# Patient Record
Sex: Male | Born: 1950 | Race: White | Hispanic: No | Smoking: Former smoker
Health system: Southern US, Community
[De-identification: ages and names within clinical notes are randomized; demographics above are authoritative.]

## PROBLEM LIST (undated history)

## (undated) DIAGNOSIS — E7800 Pure hypercholesterolemia, unspecified: Secondary | ICD-10-CM

## (undated) DIAGNOSIS — R569 Unspecified convulsions: Secondary | ICD-10-CM

## (undated) DIAGNOSIS — E785 Hyperlipidemia, unspecified: Secondary | ICD-10-CM

## (undated) DIAGNOSIS — Z9289 Personal history of other medical treatment: Secondary | ICD-10-CM

## (undated) DIAGNOSIS — G4752 REM sleep behavior disorder: Secondary | ICD-10-CM

## (undated) DIAGNOSIS — I2510 Atherosclerotic heart disease of native coronary artery without angina pectoris: Secondary | ICD-10-CM

## (undated) DIAGNOSIS — J302 Other seasonal allergic rhinitis: Secondary | ICD-10-CM

## (undated) DIAGNOSIS — I714 Abdominal aortic aneurysm, without rupture, unspecified: Secondary | ICD-10-CM

## (undated) DIAGNOSIS — K219 Gastro-esophageal reflux disease without esophagitis: Secondary | ICD-10-CM

## (undated) DIAGNOSIS — J449 Chronic obstructive pulmonary disease, unspecified: Secondary | ICD-10-CM

## (undated) DIAGNOSIS — B20 Human immunodeficiency virus [HIV] disease: Secondary | ICD-10-CM

## (undated) DIAGNOSIS — N189 Chronic kidney disease, unspecified: Secondary | ICD-10-CM

## (undated) DIAGNOSIS — D649 Anemia, unspecified: Secondary | ICD-10-CM

## (undated) DIAGNOSIS — Z21 Asymptomatic human immunodeficiency virus [HIV] infection status: Secondary | ICD-10-CM

## (undated) HISTORY — DX: Hyperlipidemia, unspecified: E78.5

## (undated) HISTORY — DX: Abdominal aortic aneurysm, without rupture: I71.4

## (undated) HISTORY — DX: Abdominal aortic aneurysm, without rupture, unspecified: I71.40

## (undated) HISTORY — DX: Atherosclerotic heart disease of native coronary artery without angina pectoris: I25.10

## (undated) HISTORY — PX: CATARACT EXTRACTION: SUR2

## (undated) HISTORY — DX: Unspecified convulsions: R56.9

## (undated) HISTORY — DX: Chronic kidney disease, unspecified: N18.9

## (undated) HISTORY — DX: Human immunodeficiency virus (HIV) disease: B20

## (undated) HISTORY — DX: Asymptomatic human immunodeficiency virus (hiv) infection status: Z21

## (undated) HISTORY — PX: CHOLECYSTECTOMY: SHX55

## (undated) HISTORY — PX: COLONOSCOPY: SHX174

## (undated) HISTORY — DX: REM sleep behavior disorder: G47.52

## (undated) HISTORY — PX: TONSILLECTOMY: SUR1361

---
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
## 2005-04-24 ENCOUNTER — Emergency Department: Payer: Self-pay | Admitting: Emergency Medicine

## 2005-04-24 ENCOUNTER — Other Ambulatory Visit: Payer: Self-pay

## 2005-07-26 ENCOUNTER — Ambulatory Visit (HOSPITAL_COMMUNITY): Admission: RE | Admit: 2005-07-26 | Discharge: 2005-07-26 | Payer: Self-pay | Admitting: *Deleted

## 2005-08-09 ENCOUNTER — Inpatient Hospital Stay (HOSPITAL_COMMUNITY): Admission: RE | Admit: 2005-08-09 | Discharge: 2005-08-13 | Payer: Self-pay | Admitting: Cardiothoracic Surgery

## 2005-08-09 HISTORY — PX: CORONARY ARTERY BYPASS GRAFT: SHX141

## 2006-02-09 ENCOUNTER — Ambulatory Visit: Payer: Self-pay

## 2006-07-27 ENCOUNTER — Ambulatory Visit: Payer: Self-pay | Admitting: Family Medicine

## 2006-08-28 ENCOUNTER — Ambulatory Visit: Payer: Self-pay | Admitting: Gastroenterology

## 2007-05-18 IMAGING — CR DG CHEST 2V
2 series · 2 of 2 positions shown · non-contrast
Comparison: None.

CLINICAL DATA: Abnormal stress test.  Preop evaluation for cardiac catheterization. 
 CHEST - 2 VIEW:

[view not recorded (1 of 2)]
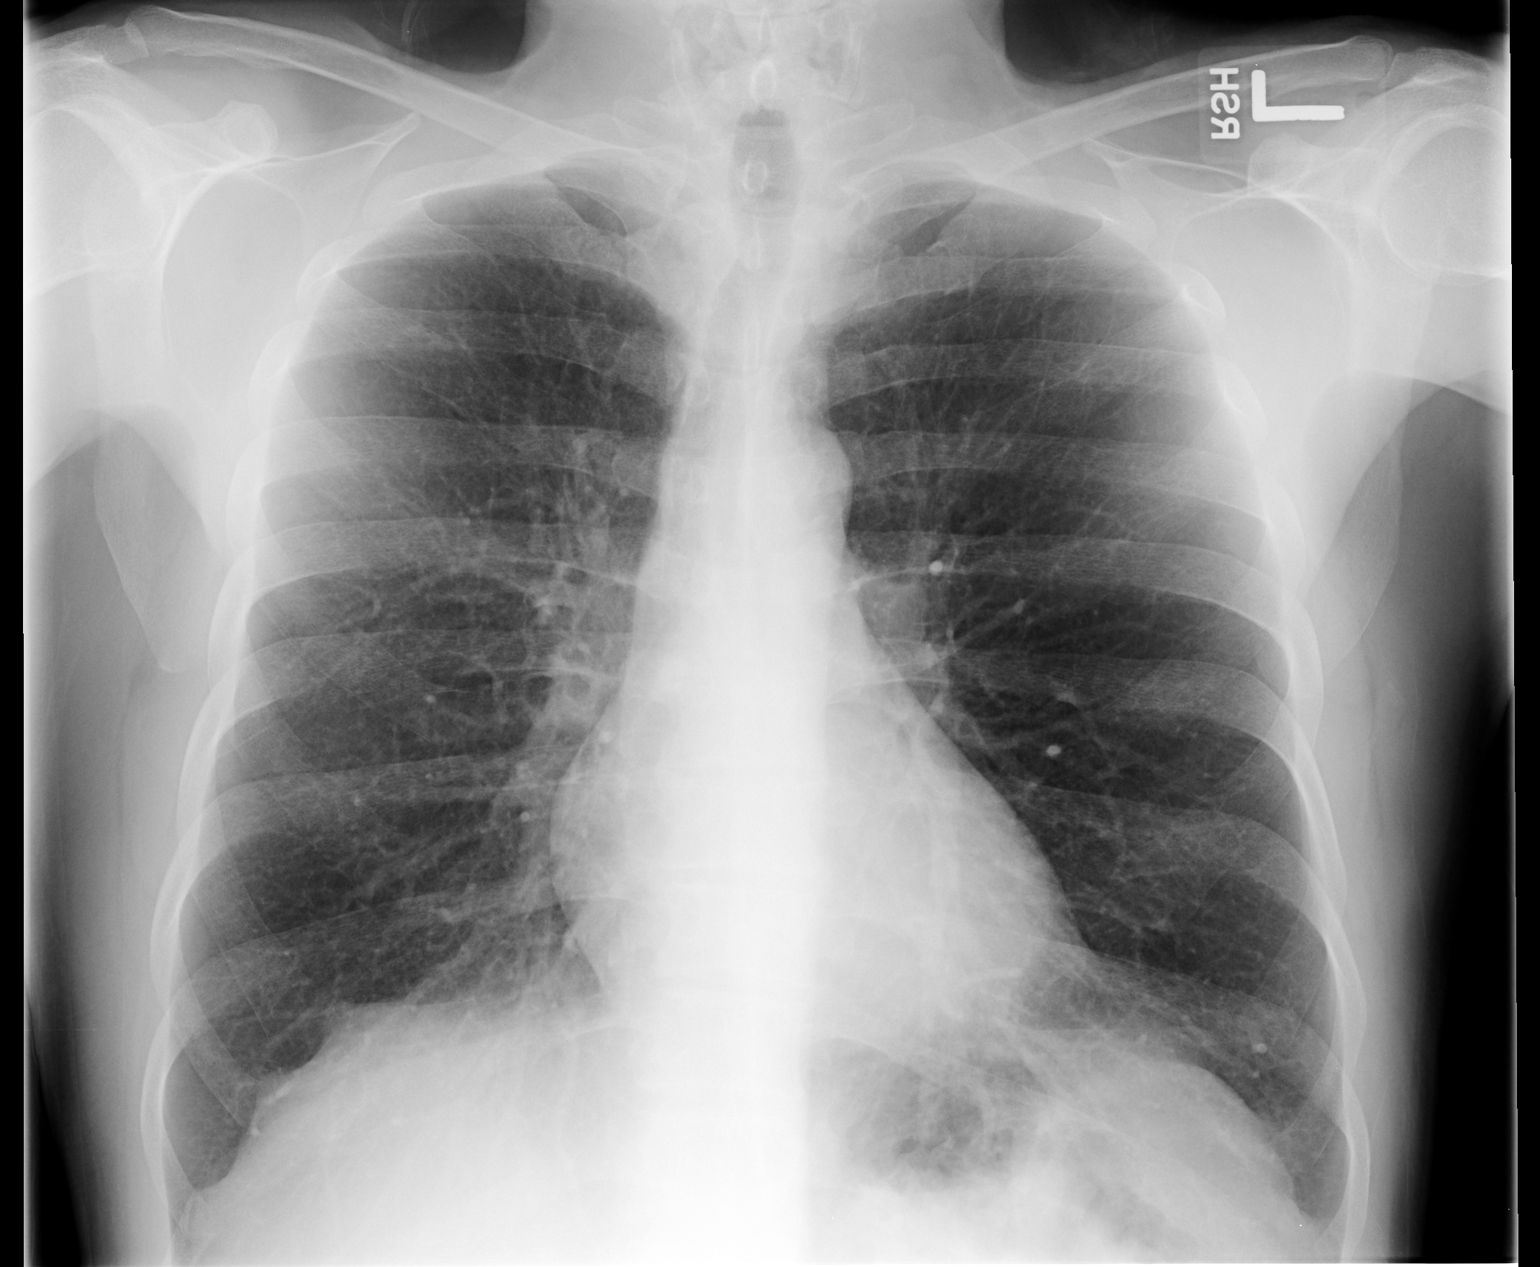

[view not recorded (2 of 2)]
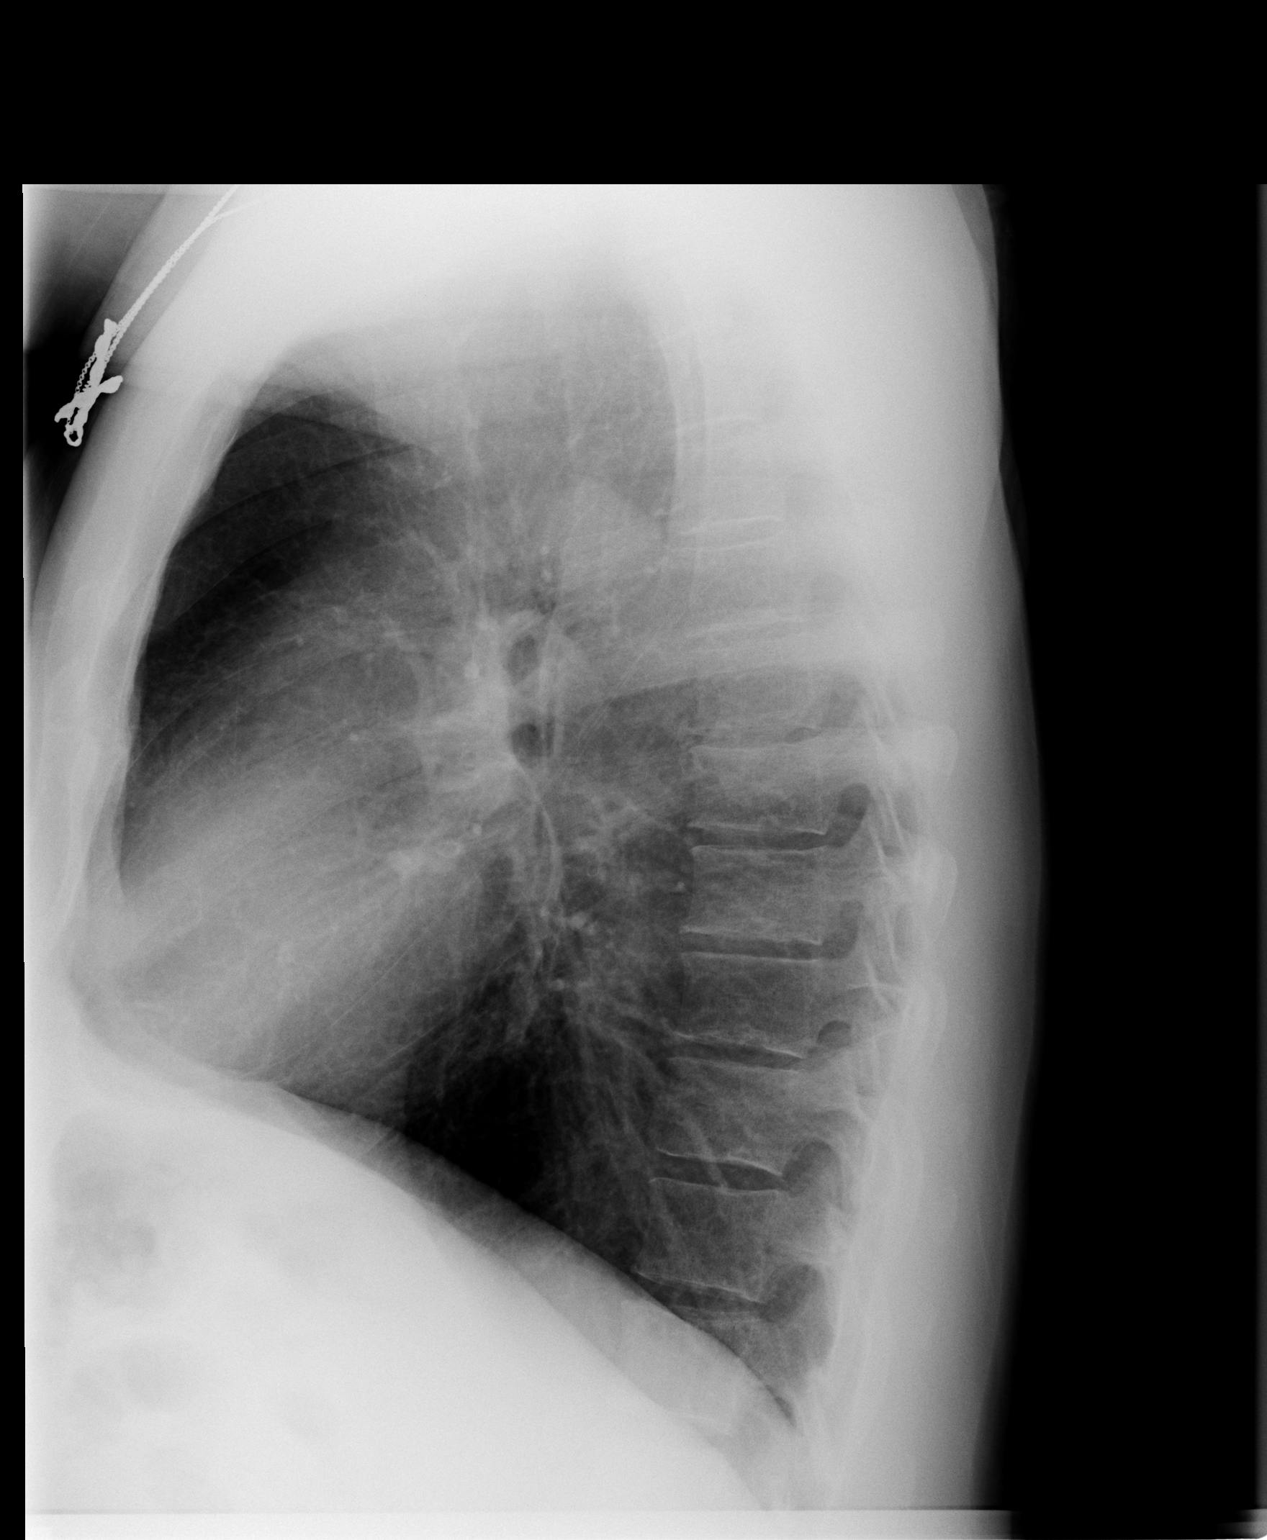

[2 of 2 positions shown; findings below may reference images not displayed]

FINDINGS: Lungs are hyperexpanded.  No focal infiltrate, pulmonary edema, or pleural effusion.  Cardiopericardial silhouette is within normal limits for size.
IMPRESSION: Emphysema without acute cardiopulmonary process.

## 2007-09-30 ENCOUNTER — Ambulatory Visit: Payer: Self-pay | Admitting: Specialist

## 2007-11-10 ENCOUNTER — Emergency Department (HOSPITAL_COMMUNITY): Admission: EM | Admit: 2007-11-10 | Discharge: 2007-11-10 | Payer: Self-pay | Admitting: *Deleted

## 2007-11-28 ENCOUNTER — Ambulatory Visit: Payer: Self-pay

## 2007-12-12 ENCOUNTER — Ambulatory Visit: Payer: Self-pay | Admitting: Family Medicine

## 2007-12-17 ENCOUNTER — Ambulatory Visit: Payer: Self-pay | Admitting: Family Medicine

## 2010-06-12 ENCOUNTER — Encounter: Payer: Self-pay | Admitting: Cardiothoracic Surgery
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
## 2010-10-07 NOTE — Discharge Summary (Signed)
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   SECONDARY DIAGNOSES:  1.  History of seizures.  2.  History of encephalopathy myalgia.  3.  HIV positive.  4.  Hyperlipidemia.  5.  History of anemia.  6.  Status post appendectomy.  7.  Status post tonsillectomy.  8.  Status post polypectomy.   IN-HOSPITAL OPERATIONS/PROCEDURES:  Off-pump coronary artery bypass grafting  x3 using a left internal mammary to the left anterior descending coronary  artery, reverse saphenous vein graft to second diagonal coronary artery, a  reverse saphenous vein graft to first obtuse marginal coronary artery.  Left  thigh endo vein harvesting was done.   HISTORY AND PHYSICAL AND HOSPITAL COURSE:  The patient is a 60 year old male  who had known coronary occlusive disease having initially been diagnosed in  [place] after moving to [place].  He was seen by Dr. Lenise Herald  and ultimately underwent cardiac catheterization which revealed evidence of  at least 70% left main obstruction confirmed with IVIS.  In addition, he had  78% stenosis of the diagonal branch.  The RCA was free of disease.  Following catheterization Dr. Tyrone Sage discussed with patient undergoing  coronary artery bypass grafting.  He discussed risks and benefits of this  procedure with patient.  Patient acknowledged understanding and agreed to  proceed.  He was scheduled for surgery for March 21.   Patient was taken to operating room August 09, 2005 where he underwent off-  pump coronary artery bypass grafting x3 using a left internal mammary to the  left anterior descending  coronary artery, clinically stable.  Patient was  extubated the evening following surgery.  Patient was seen to be alert and  oriented x3 following extubation.  Neurologic was intact.  Patient's  postoperative course was pretty much unremarkable.  Chest tubes and lines  were discontinued in the normal fashion.  Patient was transferred out to  2000 postoperative day one.  He remained hemodynamically stable.  __________. Vital signs were monitored and were seen to be stable.  Patient  was afebrile postoperatively.  He was able to be weaned off oxygen  saturating greater than 90% on room air.  __________  rhythm  postoperatively.  His incisions were clean, dry, and intact.  Patient was  tolerating diet well.  No nausea, vomiting noted.  Bowel movements were  within normal limits.  Patient's creatinine was monitored during his  postoperative course and seen to be stable.  Last creatinine noted was 1.   Patient was discharged to home postoperative day four in stable condition.  __________  three weeks.  Office will contact patient.  Patient is to  contact Dr. Mikey Bussing office to schedule follow-up appointment with him in  two weeks.  Mr. Ahlgren received instructions on diet and  incisional care.  He was told no driving until released to do so, no heavy lifting over 10  pounds.  Patient __________  shower washing his incisions using soap and  water.  He is to contact the office if he develops any drainage or opening  from any of his incision sites.  He was told to ambulate three to four times  per day, progress as tolerated, and to continue breathing exercises  __________  40 at night.  Next, Lamictal 100 mg b.i.d.  Folic acid 1 mg  daily.  Sustiva 600 mg at night.  Truvada one tablet at night.  Oxycodone 5  mg 150 tablets q.4-6h. p.r.n. pain.      Theda Belfast, [place]      Sheliah Plane, MD  Electronically Signed    KMD/MEDQ  D:  09/20/2005  T:  09/21/2005  Job:  [id number]   cc:    Darlin Priestly, MD  Fax: [phone]

## 2010-10-07 NOTE — Op Note (Signed)
NAMECHAS, AXEL NO.:  [id number]   MEDICAL RECORD NO.:  [id number]          PATIENT TYPE:  INP   LOCATION:  2040                         FACILITY:  [facility]   PHYSICIAN:  Sheliah Plane, MD    DATE OF BIRTH:  05-07-1951   DATE OF PROCEDURE:  DATE OF DISCHARGE:                                 OPERATIVE REPORT   PREOPERATIVE DIAGNOSIS:  Coronary occlusive disease with left main  obstruction.   POSTOPERATIVE DIAGNOSIS:  Coronary occlusive disease with left main  obstruction.   SURGICAL PROCEDURE:  Off-pump coronary artery bypass grafting x3 with left  internal mammary to the left anterior descending coronary artery, reverse  saphenous vein graft to the second diagonal coronary artery, reverse  saphenous vein graft to the first obtuse marginal coronary artery.Left thigh  endovein harvesting.   SURGEON:  Sheliah Plane, M.D.   FIRST ASSISTANT:  Zadie Rhine, P.A.   BRIEF HISTORY:  The patient is a 60 year old male who had known coronary  occlusive disease having initially been diagnosed in [place] after moving to  [place].  He was seen by Dr. Criss Rosales and ultimately underwent  cardiac catheterization, which revealed evidence of at least 70% left main  obstruction confirmed with IVIS.  In addition, he had 78% stenosis of the  diagonal branch.  The right coronary artery is free of disease.  Because of  the patient's left main obstruction, coronary artery bypass grafting was  recommended.  The patient agreed and signed informed consent.   DESCRIPTION OF PROCEDURE:  Once Swan-Ganz and arterial line monitor was  placed, the patient underwent general endotracheal anesthesia without  incident.  The skin of the chest and leg was prepped with Betadine and  draped in the usual sterile manner.  A small incision was made at the knee.  However, the vein on the right leg was very tortuous and not suitable for  endovein harvesting.  An incision was made  at the left knee and the vein in  the left thigh and was easily removed with a guidant endovein harvesting  system.  A median sternotomy was performed.  A left internal mammary artery  was dissected down as pedicle graft.  The distal artery had good free flow.  Pericardium was opened.  The patient's vessels were small, but it was felt  achievable for __off________ pump bypass.  The patient was systemically  heparinized.  A Guidant stabilization retractor was used.  Two segments of  veins were trimmed to appropriate lengths.  After the patient was  heparinized, a partial occlusion clamp was placed on the ascending aorta.  Two punch aortographies were performed on each of the two vein grafts,  proximal anastomosis was carried out first.  Using the Guidant retractor,  the apex of the heart was elevated and the first obtuse marginal was  identified and encircled with vessel loops.  The patient remained  hemodynamically stable during this.  The vessel was opened and admitted a 1  mm probe using the vessel loops provided hemostasis, using running 7-0  Prolene distal anastomosis was performed with a  segment of reverse saphenous  vein graft.  Air was evacuated from the graft and blood flow restored from  the first obtuse marginal.  The heart was repositioned and in a similar  fashion, vessel loops were fashioned in a diagonal coronary artery which was  opened.  There was also a small vessel admitting a 1 mm probe using a  running 7-0 Prolene distal anastomosis was performed.  Attention was then  turned to the left anterior descending coronary artery, which was also  stabilized with a Guidant system.  The artery was opened.  Vessel loops were  controlled with proximal and distal bleeding using a running 8-0 Prolene  left internal mammary artery was anastomosed to the anterior descending  coronary artery.  Blood flow was restored through the mammary artery.  Retractors were moved.  The fascia of the  mammary was tacked to the  epicardium.  The patient remained hemodynamically stable.  Atrial and  ventricular pacing wires were applied.  Graft markers were applied.  Protamine sulfate was administered with operative field hemostatic and  pericardium was reapproximated with left pleural tube and a mediastinal tube  were left in placed.  The sternum was closed with #6 stainless steel wire.  Fascia was closed with interrupted 3-0 Vicryl, running 3-0 Vicryl and  subcutaneous tissue, a 4-0 subcuticular stitch and skin edges, dry dressings  were applied.  Sponge and needle counts were reported correct at the  completion of the procedure.   PROCEDURE PERFORMED:      Sheliah Plane, MD  Electronically Signed     EG/MEDQ  D:  08/11/2005  T:  08/11/2005  Job:  [id number]   cc:   Dr. Lenise Herald

## 2010-10-07 NOTE — Cardiovascular Report (Signed)
NAMEAIVAN, [name] Kim NO.:  [id number]   MEDICAL RECORD NO.:  [id number]          PATIENT TYPE:  OIB   LOCATION:  2899                         FACILITY:  [facility]   PHYSICIAN:  Darlin Priestly, MD  DATE OF BIRTH:  01/23/1951   DATE OF PROCEDURE:  07/26/2005  DATE OF DISCHARGE:                              CARDIAC CATHETERIZATION   PROCEDURES:  1.  Left heart catheterization.  2.  Coronary angiography.  3.  Left ventriculography.  4.  Left main -- intravascular ultrasound imaging.   ATTENDING:  Darlin Priestly, MD   COMPLICATIONS:  None.   INDICATION:  Mr. Rodino is a 60 year old male, a patient of Dr. Orson Aloe at [facility] in [place], who recently moved from [place],  [place].  The patient had a cardiac catheterization in January of 2006 in  [place] suggesting a 50% left main stenosis.  He does have a history of  seizure disorder, hyperlipidemia and is HIV-positive.  He does have normal  LV.  He is now brought for repeat catheterization to reassess his left main.  He has remained very functional with no significant chest pain or shortness  of breath.   DESCRIPTION OF PROCEDURE:  After giving written consent, the patient was  brought to the cardiac cath lab where right groin was shaved, prepped and  draped in a sterile fashion.  ECG monitoring was established.  Using a  modified Seldinger technique, a #6-French arterial sheath was inserted in  the right femoral artery.  The 6-French diagnostic catheters were then used  to perform diagnostic angiography.   The left main is a short vessel which appears to be a slit-like  configuration in several views.  There is pressure damping when engaged.   The LAD is a medium-to-large-sized vessel which courses to the apex and  gives rise to 3 diagonal branches.  The LAD has 50% proximal and 60%  midstenosis at the takeoff of the third diagonal.  The remainder of the LAD  is irregular, but has no  high-grade stenosis.   The first and second diagonals are small vessels.  The third diagonal is a  medium-sized vessel which bifurcates in the mid-segment with a 70% ostial  lesion.  There is also a 70% lesion in the bifurcation.   The left circumflex is a large vessel coursing to the AV groove and gives  rise to 2 obtuse marginal branches.  The AV groove circumflex has no  significant disease.   The first and second OMs are medium-sized vessels with no significant  disease.   The right coronary artery is a large vessel which is dominant and gives rise  to a PDA as well as a posterolateral branch.  There is no significant  disease in the RCA, PDA or posterolateral branch.   Left ventriculogram reveals preserved EF at 60%.   HEMODYNAMICS:  Systemic arterial pressure was 98/52, LV systemic pressure  90/5, LVEDP of 10.   INTERVENTIONAL PROCEDURE:  Left main:  Following diagnostic angiography, a  #6-French JL4 guiding catheter was then coaxially engaged in the left  coronary ostium.  Thirty-five hundred units of IV heparin were given and ACT  was measured at 258.  Next, a 0.014 Prowater guidewire was advised via the  guiding catheter and positioned in the distal LAD without difficulty.  Next,  a 40 MHz intravascular ultrasound camera was advanced into the proximal LAD  and mechanical pullback was then performed.  This revealed approximately 4 x  3.5 proximal LAD with moderate plaque in the proximal LAD; however, he did  have a residual lumen of approximately 3 mm.  The left main vessel diameter  appeared to be approximately 5.2 x 4.7.  The luminal diameter was  approximately 2 x 3.4 and appeared to be eccentric with a slit-like  configuration.  The area of the left main was approximately 5.8 sq mm; this  produced a 70% left main stenosis by calculation.  The guidewire and imaging  catheter were removed and followup angiogram revealed no evidence of  dissection or thrombus with TIMI-3  flow to the distal vessel.   At this point, all wires and catheters were removed.  Hemostatic sheaths  were sewn in place and the patient was returned back to the recovery room in  stable condition.   CONCLUSION:  1.  Successful intravascular ultrasound imaging in the left main, revealing      a 5.8-mm luminal area with a 70% vessel stenosis.  2.  Significant left anterior descending and diagonal disease.  3.  Normal left ventricular systolic function.      Darlin Priestly, MD  Electronically Signed     RHM/MEDQ  D:  07/26/2005  T:  07/27/2005  Job:  [id number]   cc:   Sheliah Plane, MD  [address]  [place]  [place] [address]   [facility], [place], [place] Blocker, Casimiro Needle MD

## 2011-02-16 LAB — CBC
MCHC: 34.4
Platelets: 242
RBC: 4.87
RDW: 13.4
WBC: 11.7 — ABNORMAL HIGH

## 2011-02-16 LAB — COMPREHENSIVE METABOLIC PANEL
ALT: 27
AST: 31
Alkaline Phosphatase: 90
Creatinine, Ser: 1.12
GFR calc Af Amer: >60
Glucose, Bld: 105 — ABNORMAL HIGH
Total Bilirubin: 0.8
Total Protein: 6.9

## 2011-02-16 LAB — DIFFERENTIAL
Basophils Absolute: 0.1
Eosinophils Absolute: 0.2
Lymphs Abs: 3.2
Neutro Abs: 6.9
Neutrophils Relative %: 59

## 2011-02-16 LAB — POCT CARDIAC MARKERS
CKMB, poc: 1.2
Myoglobin, poc: 49.4
Troponin i, poc: <0.05

## 2011-07-17 LAB — T-HELPER CELL (CD4) - (RCID CLINIC ONLY): CD4 T Cell Abs: 489

## 2011-12-20 LAB — HEPATIC FUNCTION PANEL
ALT: 14 U/L (ref 10–40)
Alkaline Phosphatase: 83 U/L (ref 25–125)
Bilirubin, Total: 0.6 mg/dL

## 2011-12-20 LAB — BASIC METABOLIC PANEL
BUN: 12 mg/dL (ref 4–21)
Creatinine: 1.2 mg/dL (ref 0.6–1.3)
Glucose: 99 mg/dL

## 2011-12-20 LAB — LIPID PANEL: LDL Cholesterol: 59 mg/dL

## 2012-01-16 ENCOUNTER — Ambulatory Visit (INDEPENDENT_AMBULATORY_CARE_PROVIDER_SITE_OTHER): Payer: Medicare PPO

## 2012-01-16 ENCOUNTER — Other Ambulatory Visit (HOSPITAL_COMMUNITY)
Admission: RE | Admit: 2012-01-16 | Discharge: 2012-01-16 | Disposition: A | Discharge status: Home / Self Care | Payer: Medicare PPO | Source: Ambulatory Visit | Attending: Internal Medicine | Admitting: Internal Medicine

## 2012-01-16 DIAGNOSIS — B20 Human immunodeficiency virus [HIV] disease: Secondary | ICD-10-CM

## 2012-01-16 DIAGNOSIS — Z113 Encounter for screening for infections with a predominantly sexual mode of transmission: Secondary | ICD-10-CM | POA: Insufficient documentation

## 2012-01-16 DIAGNOSIS — I10 Essential (primary) hypertension: Secondary | ICD-10-CM

## 2012-01-16 DIAGNOSIS — I2510 Atherosclerotic heart disease of native coronary artery without angina pectoris: Secondary | ICD-10-CM

## 2012-01-16 DIAGNOSIS — G40909 Epilepsy, unspecified, not intractable, without status epilepticus: Secondary | ICD-10-CM

## 2012-01-16 DIAGNOSIS — E781 Pure hyperglyceridemia: Secondary | ICD-10-CM

## 2012-01-17 LAB — CBC WITH DIFFERENTIAL/PLATELET
Basophils Absolute: 0 10*3/uL (ref 0.0–0.1)
Basophils Relative: 0 % (ref 0–1)
Basophils Relative: 1 % (ref 0–1)
Eosinophils Absolute: 0.1 10*3/uL (ref 0.0–0.7)
Eosinophils Relative: 1 % (ref 0–5)
Lymphocytes Relative: 32 % (ref 12–46)
Lymphs Abs: 2.9 10*3/uL (ref 0.7–4.0)
MCHC: 35.5 g/dL (ref 30.0–36.0)
MCV: 97.2 fL (ref 78.0–100.0)
Monocytes Absolute: 1 10*3/uL (ref 0.1–1.0)
Monocytes Absolute: 1 10*3/uL (ref 0.1–1.0)
Monocytes Relative: 11 % (ref 3–12)
Neutrophils Relative %: 56 % (ref 43–77)
Neutrophils Relative %: 55 % (ref 43–77)
Platelets: 212 10*3/uL (ref 150–400)
RBC: 5.04 MIL/uL (ref 4.22–5.81)
RDW: 13.6 % (ref 11.5–15.5)
WBC: 8.8 10*3/uL (ref 4.0–10.5)

## 2012-01-17 LAB — COMPLETE METABOLIC PANEL WITH GFR
ALT: 14 U/L (ref 0–53)
ALT: 15 U/L (ref 0–53)
AST: 15 U/L (ref 0–37)
AST: 16 U/L (ref 0–37)
Albumin: 5 g/dL (ref 3.5–5.2)
Alkaline Phosphatase: 69 U/L (ref 39–117)
Alkaline Phosphatase: 70 U/L (ref 39–117)
BUN: 14 mg/dL (ref 6–23)
CO2: 26 mEq/L (ref 19–32)
Chloride: 103 mEq/L (ref 96–112)
Creat: 1.22 mg/dL (ref 0.50–1.35)
GFR, Est African American: 71 mL/min
GFR, Est Non African American: 64 mL/min
Glucose, Bld: 86 mg/dL (ref 70–99)
Glucose, Bld: 86 mg/dL (ref 70–99)
Potassium: 4.7 mEq/L (ref 3.5–5.3)
Sodium: 141 mEq/L (ref 135–145)
Total Protein: 7.3 g/dL (ref 6.0–8.3)
Total Protein: 7.4 g/dL (ref 6.0–8.3)

## 2012-01-17 LAB — URINALYSIS
Bilirubin Urine: NEGATIVE
Glucose, UA: NEGATIVE mg/dL
Leukocytes, UA: NEGATIVE
Nitrite: NEGATIVE
Urobilinogen, UA: 0.2 mg/dL (ref 0.0–1.0)
pH: 5.5 (ref 5.0–8.0)

## 2012-01-17 LAB — HEPATITIS A ANTIBODY, TOTAL: Hep A Total Ab: NEGATIVE

## 2012-01-17 LAB — HEPATITIS B SURFACE ANTIBODY,QUALITATIVE: Hep B S Ab: POSITIVE — AB

## 2012-01-17 LAB — LIPID PANEL
LDL Cholesterol: 44 mg/dL (ref 0–99)
Triglycerides: 123 mg/dL (ref <150)

## 2012-01-17 LAB — T-HELPER CELL (CD4) - (RCID CLINIC ONLY): CD4 T Cell Abs: 750 uL (ref 400–2700)

## 2012-01-17 LAB — HEPATITIS C ANTIBODY

## 2012-01-17 LAB — HEPATITIS B SURFACE ANTIGEN

## 2012-01-17 LAB — RPR

## 2012-01-23 DIAGNOSIS — G40909 Epilepsy, unspecified, not intractable, without status epilepticus: Secondary | ICD-10-CM | POA: Insufficient documentation

## 2012-01-23 DIAGNOSIS — I10 Essential (primary) hypertension: Secondary | ICD-10-CM | POA: Insufficient documentation

## 2012-01-23 DIAGNOSIS — I2510 Atherosclerotic heart disease of native coronary artery without angina pectoris: Secondary | ICD-10-CM | POA: Insufficient documentation

## 2012-01-23 DIAGNOSIS — B20 Human immunodeficiency virus [HIV] disease: Secondary | ICD-10-CM | POA: Insufficient documentation

## 2012-01-23 NOTE — Progress Notes (Signed)
Pt and his partner of 40 years  moved to [place] from [place] in May, 2013.  Pt has been in care for HIV since diagnosis in 1998. He has expressed interest in changing his regimen due to "horribly vivid dreams". Partial notes from medical records received.   Laurell Josephs, RN

## 2012-02-01 ENCOUNTER — Ambulatory Visit: Payer: Medicare PPO

## 2012-02-08 ENCOUNTER — Ambulatory Visit (INDEPENDENT_AMBULATORY_CARE_PROVIDER_SITE_OTHER): Payer: Medicare PPO | Admitting: Internal Medicine

## 2012-02-08 ENCOUNTER — Encounter: Payer: Self-pay | Admitting: Internal Medicine

## 2012-02-08 ENCOUNTER — Ambulatory Visit: Payer: Medicare PPO | Admitting: Internal Medicine

## 2012-02-08 VITALS — BP 144/77 | HR 55 | Temp 97.8°F | Ht 67.0 in | Wt 142.2 lb

## 2012-02-08 DIAGNOSIS — B20 Human immunodeficiency virus [HIV] disease: Secondary | ICD-10-CM

## 2012-02-08 DIAGNOSIS — Z23 Encounter for immunization: Secondary | ICD-10-CM

## 2012-02-08 DIAGNOSIS — E785 Hyperlipidemia, unspecified: Secondary | ICD-10-CM

## 2012-02-08 DIAGNOSIS — E7849 Other hyperlipidemia: Secondary | ICD-10-CM | POA: Insufficient documentation

## 2012-02-08 NOTE — Progress Notes (Signed)
Patient ID: [name] Kim, male   DOB: 16-Aug-1950, 61 y.o.   MRN: [id number]    [facility]  Reason for Consult: Ongoing management of his HIV infection Referring Physician: Dr. Larita Fife  Patient Active Problem List  Diagnosis  . Coronary artery disease  . Seizure disorder  . Human immunodeficiency virus (HIV) disease  . Dyslipidemia    Patient's Medications  New Prescriptions   No medications on file  Previous Medications   ASPIRIN 81 MG TABLET    Take 81 mg by mouth daily.   EMTRICITABINE-TENOFOVIR (TRUVADA) 200-300 MG PER TABLET    Take 1 tablet by mouth daily.   EZETIMIBE-SIMVASTATIN (VYTORIN) 10-40 MG PER TABLET    Take 1 tablet by mouth at bedtime.   LAMOTRIGINE (LAMICTAL) 100 MG TABLET    Take 100 mg by mouth 2 (two) times daily.   OMEGA-3 ACID ETHYL ESTERS (LOVAZA) 1 G CAPSULE    Take 1 g by mouth 2 (two) times daily.   RALTEGRAVIR (ISENTRESS) 400 MG TABLET    Take 400 mg by mouth 2 (two) times daily.  Modified Medications   No medications on file  Discontinued Medications   No medications on file    Recommendations: 1. Continue Truvada and Isentress 2. Followup here after lab work in 6 months   Assessment: Fidel's HIV infection is under excellent control. I doubt that his vivid dreams are due to Truvada or Isentress. He states that since he is doing well and his numbers look so good he does not want to try changing to an alternative regimen. I will plan on seeing him back every 6 months.   HPI: [name] Kim is a 61 y.o. 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 Cyndia Bent for ongoing management of his HIV infection. Tashon was found to be HIV positive and he was tested in 1998. His long-time partner, Reuel Derby, was diagnosed in 95. Aurelio Brash is with him today. When he was first diagnosed he was found to be anemic and this worsened after he started on Combivir and Crixivan in 1988. He recalls  that his CD4 count was just under 400 when he was diagnosed. He does not recall his viral load. He required several transfusions for his anemia finally resolved when he was changed to a new regimen. He is not entirely certain what medications he has been on he recalls taking Zerit, Sustiva, and Norvir in the past. He believes that his medications has always been able to suppress his virus and that he's never had any evidence of resistance.  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 Apparently the brain biopsy was nondiagnostic. There was initially some concern that he had progressive multifocal leukoencephalopathy but he states that his CD4 count has never been below 200 that he is aware of. Eventually he was told that he had encephalomalacia. After he had the biopsy he suffers from seizures but he has not had a seizure since 2004.  He did not tolerate Sustiva well because of bad dreams and several years ago his regimen was changed to Truvada and Isentress. Initially the bad dreams went away but they seem to have recurred over the last 1-2 years. He describes them as very vivid dreams. They happen 1-2 times per week. During exam he generally believes he is being chased by someone.  Joey will wake him up from the dreams because he is kicking, punching and cursing. He also states that he will speak [place] during his drains even though he does not know [place]. He states that he is concerned about his vivid dreams may be related to the Truvada and Isentress but he states that he never misses a dose.  Review of Systems: Pertinent items are noted in HPI.      Past Medical History  Diagnosis Date  . HIV infection   . Hyperlipidemia   . Seizures     History  Substance Use Topics  . Smoking status: Former Smoker    Types: Cigarettes    Quit date: 05/22/2004  . Smokeless tobacco: Never Used  . Alcohol Use: Yes    Family  History  Problem Relation Age of Onset  . Cancer Mother    Allergies  Allergen Reactions  . Azt (Zidovudine) Other (See Comments)    Caused bone marrow problems  2000    OBJECTIVE: Blood pressure 144/77, pulse 55, temperature 97.8 F (36.6 C), temperature source Oral, height 5\' 7"  (1.702 m), weight 142 lb 4 oz (64.524 kg). General: He is in good spirits and in no distress Skin: No rash or palpable adenopathy Oral: Full upper and lower dentures Lungs: Clear Cor: Regular S1 and S2 and no murmurs Abdomen: Soft and nontender Joints and extremities: Normal  HIV 1 RNA Quant (copies/mL)  Date Value  01/16/2012 <20      CD4 T Cell Abs (cmm)  Date Value  01/16/2012 750   07/17/2011 489     Cliffton Asters, MD [facility] [phone] pager   [phone] cell 02/08/2012, 1:50 PM

## 2012-02-09 ENCOUNTER — Other Ambulatory Visit: Payer: Self-pay | Admitting: *Deleted

## 2012-02-09 DIAGNOSIS — B20 Human immunodeficiency virus [HIV] disease: Secondary | ICD-10-CM

## 2012-02-09 MED ORDER — RALTEGRAVIR POTASSIUM 400 MG PO TABS: 400.0000 mg | ORAL_TABLET | Freq: Two times a day (BID) | ORAL | Status: DC

## 2012-02-09 MED ORDER — EMTRICITABINE-TENOFOVIR DF 200-300 MG PO TABS: 1.0000 | ORAL_TABLET | Freq: Every day | ORAL | Status: DC

## 2012-06-26 ENCOUNTER — Encounter: Payer: Self-pay | Admitting: Cardiology

## 2012-06-26 ENCOUNTER — Ambulatory Visit (INDEPENDENT_AMBULATORY_CARE_PROVIDER_SITE_OTHER): Payer: Medicare PPO | Admitting: Cardiology

## 2012-06-26 VITALS — BP 142/82 | HR 55 | Ht 67.0 in | Wt 140.8 lb

## 2012-06-26 DIAGNOSIS — I2510 Atherosclerotic heart disease of native coronary artery without angina pectoris: Secondary | ICD-10-CM

## 2012-06-26 DIAGNOSIS — E785 Hyperlipidemia, unspecified: Secondary | ICD-10-CM

## 2012-06-26 MED ORDER — SIMVASTATIN 40 MG PO TABS: 40.0000 mg | ORAL_TABLET | Freq: Every day | ORAL | Status: DC

## 2012-06-26 NOTE — Patient Instructions (Signed)
Stop Lovaza and Vytorin  Start simvastatin 40 mg daily.  I will see you in 6 months with fasting blood work

## 2012-06-26 NOTE — Progress Notes (Signed)
[name] Kim Date of Birth: [birth year] Medical Record #[id number]  History of Present Illness: [name] is seen at the request of Dr. Cyndia Kim to establish cardiac care. He is a 62 year old white male who has a history of coronary disease. This was initially diagnosed based on a stress test. He then underwent cardiac catheterization which demonstrated 70% left main stenosis. He underwent coronary bypass surgery in 2007 including an LIMA graft to the LAD, saphenous vein graft to the diagonal, and saphenous vein graft to the obtuse marginal vessel. He has done very well since then. His last exercise stress test in 2011 was normal while he was in [place]. He has a history of chronic HIV infection and is on antiviral therapy. He has a history of dyslipidemia with low HDL. He currently denies any symptoms of chest pain, shortness of breath, or palpitations. He exercises regularly and has no limitations.  Current Outpatient Prescriptions on File Prior to Visit  Medication Sig Dispense Refill  . aspirin 81 MG tablet Take 81 mg by mouth daily.      [name] emtricitabine-tenofovir (TRUVADA) 200-300 MG per tablet Take 1 tablet by mouth daily.  30 tablet  prn  . lamoTRIgine (LAMICTAL) 100 MG tablet Take 100 mg by mouth 2 (two) times daily.      . raltegravir (ISENTRESS) 400 MG tablet Take 1 tablet (400 mg total) by mouth 2 (two) times daily.  60 tablet  prn  . simvastatin (ZOCOR) 40 MG tablet Take 1 tablet (40 mg total) by mouth at bedtime.  90 tablet  3    Allergies  Allergen Reactions  . Azt (Zidovudine) Other (See Comments)    Caused bone marrow problems  2000    Past Medical History  Diagnosis Date  . HIV infection   . Hyperlipidemia   . Seizures   . CAD (coronary artery disease)     Past Surgical History  Procedure Date  . Coronary artery bypass graft 08-09-2005    LIMA-LAD, SVG-diagonal, SVG-OM  . Appendectomy 1970  . Tonsilectomy, adenoidectomy, bilateral myringotomy and tubes 08-1964     History  Smoking status  . Former Smoker  . Types: Cigarettes  . Quit date: 05/22/2004  Smokeless tobacco  . Never Used    History  Alcohol Use  . Yes    Family History  Problem Relation Age of Onset  . Cancer Mother     Review of Systems: The review of systems is positive for history of chills. He reports about 6 episodes over the past year without fever. He complains that intermittently his right hand turns ice cold but does not change color. All other systems were reviewed and are negative.  Physical Exam: BP 142/82  Pulse 55  Ht 5\' 7"  (1.702 m)  Wt 140 lb 12.8 oz (63.866 kg)  BMI 22.05 kg/m2 He is a thin white male in no acute distress.The patient is alert and oriented x 3.  The mood and affect are normal.  The skin is warm and dry.  Color is normal.  The HEENT exam reveals that the sclera are nonicteric.  The mucous membranes are moist.  The carotids are 2+ without bruits.  There is no thyromegaly.  There is no JVD.  The lungs are clear.  The chest wall is non tender.  The heart exam reveals a regular rate with a normal S1 and S2.  There are no murmurs, gallops, or rubs.  The PMI is not displaced.   Abdominal exam reveals  good bowel sounds.  There is no guarding or rebound.  There is no hepatosplenomegaly or tenderness.  There are no masses.  Exam of the legs reveal no clubbing, cyanosis, or edema.  The legs are without rashes.  The distal pulses are intact.  Cranial nerves II - XII are intact.  Motor and sensory functions are intact.  The gait is normal.  LABORATORY DATA: ECG demonstrates sinus bradycardia with otherwise normal ECG. Lab Results  Component Value Date   WBC 8.8 01/16/2012   WBC 9.1 01/16/2012   HGB 17.2* 01/16/2012   HGB 17.3* 01/16/2012   HCT 49.0 01/16/2012   HCT 48.7 01/16/2012   PLT 212 01/16/2012   PLT 209 01/16/2012   GLUCOSE 86 01/16/2012   GLUCOSE 86 01/16/2012   CHOL 106 01/16/2012   CHOL 106 01/16/2012   TRIG 123 01/16/2012   TRIG 123 01/16/2012    HDL 36* 01/16/2012   HDL 37* 01/16/2012   LDLCALC 45 01/16/2012   LDLCALC 44 01/16/2012   ALT 15 01/16/2012   ALT 14 01/16/2012   AST 15 01/16/2012   AST 16 01/16/2012   NA 141 01/16/2012   NA 141 01/16/2012   K 4.7 01/16/2012   K 4.7 01/16/2012   CL 104 01/16/2012   CL 103 01/16/2012   CREATININE 1.25 01/16/2012   CREATININE 1.22 01/16/2012   BUN 14 01/16/2012   BUN 14 01/16/2012   CO2 27 01/16/2012   CO2 26 01/16/2012     Assessment / Plan: 1. Coronary disease status post CABG in 2007. He is asymptomatic. I requested a copy of his most recent cardiac evaluation in [place]. If his stress test in 2011 was normal I would not recommend another stress test until next year. We will continue on his aspirin.  2. Dyslipidemia. Based on his lipid levels I think she may be overmedicated. We will stop his Vytorin and switch him to simvastatin 40 mg per day. He'll stop his lovaza. We will repeat fasting lab work in 6 months.  3. HIV disease. Continue antiviral therapy.

## 2012-07-01 ENCOUNTER — Telehealth: Payer: Self-pay | Admitting: Cardiology

## 2012-07-01 NOTE — Telephone Encounter (Signed)
Release Faxed to [facility] @ [phone] 07/01/12/KM

## 2012-07-02 ENCOUNTER — Telehealth: Payer: Self-pay | Admitting: Cardiology

## 2012-07-02 NOTE — Telephone Encounter (Signed)
No answer. Will continue to try to reach pt.

## 2012-07-02 NOTE — Telephone Encounter (Signed)
New Problem:     Patient called in because he was placed on simvastatin (ZOCOR) 40 MG tablet and his BP has risen above normal since.  Patient is wondering if he needs to be concerned about this.  Please call back.

## 2012-07-02 NOTE — Telephone Encounter (Signed)
Records Received From [facility]  Gave to [facility]  07/02/12/KM

## 2012-07-02 NOTE — Telephone Encounter (Signed)
zocor side effects reviewed, no mention of HTN effects. Reviewed his sodium intake for past few days, pt drinking V-8 daily, eating sausage patties/canned soup/ and ham and cheese. Pt to lower sodium intake and call with further concerns, pt happily agreed to plan.

## 2012-07-12 ENCOUNTER — Encounter: Payer: Self-pay | Admitting: Cardiology

## 2012-07-22 ENCOUNTER — Telehealth: Payer: Self-pay | Admitting: Cardiology

## 2012-07-22 DIAGNOSIS — E785 Hyperlipidemia, unspecified: Secondary | ICD-10-CM

## 2012-07-22 NOTE — Telephone Encounter (Signed)
I would stay off zocor and repeat a fasting lipid panel in one month to see what their baseline is. Then we can decide on further therapy if needed. Zocor may have interacted with antiviral therapy.  Peter Swaziland MD, [facility]

## 2012-07-22 NOTE — Telephone Encounter (Signed)
New problem   Pt is taking Simvastatin and is having side effect of dry mouth,dizziness,abdominal pain,left side aching,chills. Pt would like to speak to someone concerning this matter.

## 2012-07-22 NOTE — Telephone Encounter (Signed)
Pt states he stopped taking Simvastatin 3 days ago and his s/s has stopped. He wants to know if he can try to control his cholesterol with a low cholesterol diet and exercise. Please advise.

## 2012-07-23 NOTE — Telephone Encounter (Signed)
Patient called was told Dr.Jordan advised to stay off zocor for now and repeat fasting lipids in 1 month.

## 2012-07-23 NOTE — Addendum Note (Signed)
Addended by: Meda Klinefelter D on: 07/23/2012 03:18 PM   Modules accepted: Orders

## 2012-07-23 NOTE — Telephone Encounter (Signed)
See previous 07/23/12 note.

## 2012-08-06 ENCOUNTER — Other Ambulatory Visit: Payer: Medicare PPO

## 2012-08-07 ENCOUNTER — Other Ambulatory Visit: Payer: Self-pay | Admitting: Internal Medicine

## 2012-08-07 ENCOUNTER — Other Ambulatory Visit (INDEPENDENT_AMBULATORY_CARE_PROVIDER_SITE_OTHER): Payer: Medicare PPO

## 2012-08-07 DIAGNOSIS — B20 Human immunodeficiency virus [HIV] disease: Secondary | ICD-10-CM

## 2012-08-07 LAB — CBC
HCT: 48.6 % (ref 39.0–52.0)
MCHC: 35 g/dL (ref 30.0–36.0)
Platelets: 218 10*3/uL (ref 150–400)
RDW: 13.5 % (ref 11.5–15.5)
WBC: 7.6 10*3/uL (ref 4.0–10.5)

## 2012-08-07 LAB — COMPREHENSIVE METABOLIC PANEL
ALT: 10 U/L (ref 0–53)
AST: 13 U/L (ref 0–37)
Albumin: 4.7 g/dL (ref 3.5–5.2)
Alkaline Phosphatase: 87 U/L (ref 39–117)
BUN: 17 mg/dL (ref 6–23)
Potassium: 4.2 mEq/L (ref 3.5–5.3)
Sodium: 140 mEq/L (ref 135–145)

## 2012-08-07 LAB — LIPID PANEL
HDL: 32 mg/dL — ABNORMAL LOW (ref >39)
LDL Cholesterol: 108 mg/dL — ABNORMAL HIGH (ref 0–99)

## 2012-08-07 LAB — RPR

## 2012-08-08 LAB — HIV-1 RNA QUANT-NO REFLEX-BLD
HIV 1 RNA Quant: <20 copies/mL (ref <20)
HIV-1 RNA Quant, Log: <1.3 {Log} (ref <1.30)

## 2012-08-08 LAB — T-HELPER CELL (CD4) - (RCID CLINIC ONLY): CD4 T Cell Abs: 410 uL (ref 400–2700)

## 2012-08-20 ENCOUNTER — Encounter: Payer: Self-pay | Admitting: *Deleted

## 2012-08-20 ENCOUNTER — Encounter: Payer: Self-pay | Admitting: Internal Medicine

## 2012-08-20 ENCOUNTER — Ambulatory Visit (INDEPENDENT_AMBULATORY_CARE_PROVIDER_SITE_OTHER): Payer: Medicare PPO | Admitting: Internal Medicine

## 2012-08-20 VITALS — BP 157/82 | HR 56 | Temp 97.6°F | Ht 66.5 in | Wt 140.0 lb

## 2012-08-20 DIAGNOSIS — B20 Human immunodeficiency virus [HIV] disease: Secondary | ICD-10-CM

## 2012-08-20 NOTE — Progress Notes (Signed)
Patient ID: MARTINE TRAGESER, male   DOB: 25-Jan-1951, 62 y.o.   MRN: [id number]          [facility] for Infectious Disease  Patient Active Problem List  Diagnosis  . Coronary artery disease  . Seizure disorder  . Human immunodeficiency virus (HIV) disease  . Dyslipidemia    Patient's Medications  New Prescriptions   No medications on file  Previous Medications   ASPIRIN 81 MG TABLET    Take 81 mg by mouth daily.   EMTRICITABINE-TENOFOVIR (TRUVADA) 200-300 MG PER TABLET    Take 1 tablet by mouth daily.   ESOMEPRAZOLE (NEXIUM) 40 MG CAPSULE    Take 40 mg by mouth daily before breakfast.   LAMOTRIGINE (LAMICTAL) 100 MG TABLET    Take 100 mg by mouth 2 (two) times daily.   RALTEGRAVIR (ISENTRESS) 400 MG TABLET    Take 1 tablet (400 mg total) by mouth 2 (two) times daily.  Modified Medications   No medications on file  Discontinued Medications   SIMVASTATIN (ZOCOR) 40 MG TABLET    Take 1 tablet (40 mg total) by mouth at bedtime.    Subjective: Tiernan is in for his routine followup visit. He denies missing any doses of his Truvada or Isentress since his last visit. He was having some problem with subjective fevers and chills that he thought might be related to his Vytorin. He saw Dr. Peter Swaziland who changed the Vytorin to simvastatin. Subjective fever and chills resolved promptly but he developed diffuse muscle pain related to the simvastatin and had to stop that after several days.  Objective: Temp: 97.6 F (36.4 C) (04/01 0829) Temp src: Oral (04/01 0829) BP: 157/82 mmHg (04/01 0829) Pulse Rate: 56 (04/01 0829)  General: He is in good spirits and in no distress Skin: No rash Lungs: Clear Cor: Regular S1-S2 no murmurs  Lab Results HIV 1 RNA Quant (copies/mL)  Date Value  08/07/2012 <20   01/16/2012 <20      CD4 T Cell Abs (cmm)  Date Value  08/07/2012 410   01/16/2012 750   07/17/2011 489      Lab Results  Component Value Date   WBC 7.6 08/07/2012   HGB 17.0  08/07/2012   HCT 48.6 08/07/2012   MCV 96.4 08/07/2012   PLT 218 08/07/2012   BMET    Component Value Date/Time   NA 140 08/07/2012 0856   NA 139 12/20/2011   K 4.2 08/07/2012 0856   CL 105 08/07/2012 0856   CO2 26 08/07/2012 0856   GLUCOSE 96 08/07/2012 0856   BUN 17 08/07/2012 0856   BUN 12 12/20/2011   CREATININE 1.27 08/07/2012 0856   CREATININE 1.2 12/20/2011   CREATININE 1.12 11/10/2007 2105   CALCIUM 10.1 08/07/2012 0856   GFRNONAA >60 11/10/2007 2105   GFRAA  Value: >60        The eGFR has been calculated using the MDRD equation. This calculation has not been validated in all clinical 11/10/2007 2105   Lab Results  Component Value Date   CHOL 196 08/07/2012   HDL 32* 08/07/2012   LDLCALC 108* 08/07/2012   TRIG 282* 08/07/2012   CHOLHDL 6.1 08/07/2012   Assessment: His HIV infection remains under excellent control.  Plan: 1. Continue Truvada and Isentress 2. Followup after lab work in 6 months   Cliffton Asters, MD [facility] for Infectious Disease [facility] [phone] pager   [phone] cell 08/20/2012, 9:00 AM

## 2012-08-23 ENCOUNTER — Telehealth: Payer: Self-pay | Admitting: *Deleted

## 2012-08-23 ENCOUNTER — Other Ambulatory Visit: Payer: Medicare PPO

## 2012-08-23 NOTE — Telephone Encounter (Signed)
Patient called and asked that I fax his most recent lab results to his [facility] Dr Swaziland at [phone] I faxed them.

## 2012-09-09 ENCOUNTER — Encounter: Payer: Self-pay | Admitting: Internal Medicine

## 2012-10-24 ENCOUNTER — Telehealth: Payer: Self-pay | Admitting: *Deleted

## 2012-10-24 NOTE — Telephone Encounter (Signed)
Patient called and advised he feels terrible he reports he feels as bad as he did when he was diagnosed. He wants to see Dr Orvan Falconer but his next appt is not until 02/2013. Advised he just can not put into words how bad he feels, his body hurts all over and he has not energy. Advised him we can fit him in 10/29/12 with Dr Orvan Falconer at 9 am and he accepted.

## 2012-10-29 ENCOUNTER — Ambulatory Visit (INDEPENDENT_AMBULATORY_CARE_PROVIDER_SITE_OTHER): Payer: Medicare PPO | Admitting: Internal Medicine

## 2012-10-29 ENCOUNTER — Encounter: Payer: Self-pay | Admitting: Internal Medicine

## 2012-10-29 VITALS — BP 139/79 | HR 59 | Temp 97.9°F | Wt 133.0 lb

## 2012-10-29 DIAGNOSIS — R5383 Other fatigue: Secondary | ICD-10-CM

## 2012-10-29 DIAGNOSIS — R634 Abnormal weight loss: Secondary | ICD-10-CM

## 2012-10-29 DIAGNOSIS — R5381 Other malaise: Secondary | ICD-10-CM | POA: Insufficient documentation

## 2012-10-29 LAB — CBC
MCHC: 36.5 g/dL — ABNORMAL HIGH (ref 30.0–36.0)
Platelets: 230 10*3/uL (ref 150–400)
RDW: 13.4 % (ref 11.5–15.5)

## 2012-10-29 LAB — COMPREHENSIVE METABOLIC PANEL
AST: 15 U/L (ref 0–37)
Albumin: 4.7 g/dL (ref 3.5–5.2)
Alkaline Phosphatase: 100 U/L (ref 39–117)
Potassium: 4.4 mEq/L (ref 3.5–5.3)
Sodium: 138 mEq/L (ref 135–145)
Total Protein: 7.3 g/dL (ref 6.0–8.3)

## 2012-10-29 LAB — URINALYSIS
Bilirubin Urine: NEGATIVE
Glucose, UA: NEGATIVE mg/dL
Ketones, ur: NEGATIVE mg/dL
Protein, ur: NEGATIVE mg/dL
Urobilinogen, UA: 0.2 mg/dL (ref 0.0–1.0)

## 2012-10-29 NOTE — Progress Notes (Signed)
Patient ID: [name] Kim, male   DOB: 03-Sep-1950, 62 y.o.   MRN: [id number]          [facility] for Infectious Disease  Patient Active Problem List   Diagnosis Date Noted  . Human immunodeficiency virus (HIV) disease 01/23/2012    Priority: High  . Other malaise and fatigue 10/29/2012  . Weight loss, unintentional 10/29/2012  . Dyslipidemia 02/08/2012  . Coronary artery disease 01/23/2012  . Seizure disorder 01/23/2012    Patient's Medications  New Prescriptions   No medications on file  Previous Medications   ASPIRIN 81 MG TABLET    Take 81 mg by mouth daily.   EMTRICITABINE-TENOFOVIR (TRUVADA) 200-300 MG PER TABLET    Take 1 tablet by mouth daily.   ESOMEPRAZOLE (NEXIUM) 40 MG CAPSULE    Take 40 mg by mouth daily before breakfast.   LAMOTRIGINE (LAMICTAL) 100 MG TABLET    Take 100 mg by mouth 2 (two) times daily.   RALTEGRAVIR (ISENTRESS) 400 MG TABLET    Take 1 tablet (400 mg total) by mouth 2 (two) times daily.  Modified Medications   No medications on file  Discontinued Medications   No medications on file    Subjective: Jillyn Hidden is seen on work in basis. Over the last 3 weeks he is started to feel terrible. He has difficulty putting into words how he is feeling. He says that he feels best when he first gets up in the morning but then as the day progresses he feels more fatigued and has generalized aching, particularly in his lower lobe extremities. He also has decreased appetite and has been losing weight. He had one episode of night sweats recently but he does not believe he is had any fever and he has not had any chills. He states that his mood has been worse and he can get angry very easily but he denies feeling sad or depressed. He has not missed any doses of his Truvada or Isentress.  His Lamictal was changed by his pharmacy to a different looking pill. He continued to take it twice a day throughout all of May then realized that the dose it also been increased to 200  mg so instead of taking 100 mg twice daily he had been taking 200 mg twice daily. He contacted Dr. Cyndia Bent in his pharmacy and the dose was changed back to 100 mg twice daily at the beginning of June. He has not noted any change in how he is feeling yet.  He says that he has been worried that his HIV infection may be out of control or that he has developed cancer.  Review of Systems: Constitutional: positive for anorexia, night sweats and weight loss, negative for chills and fevers Eyes: negative Ears, nose, mouth, throat, and face: negative Respiratory: negative Cardiovascular: negative Gastrointestinal: positive for diarrhea, negative for abdominal pain, constipation, nausea and vomiting Genitourinary:positive for decreased urine output, negative for decreased stream, dysuria, frequency and nocturia Integument/breast: negative for rash  Past Medical History  Diagnosis Date  . HIV infection   . Hyperlipidemia   . Seizures   . CAD (coronary artery disease)     History  Substance Use Topics  . Smoking status: Former Smoker    Types: Cigarettes    Quit date: 05/22/2004  . Smokeless tobacco: Never Used  . Alcohol Use: Yes    Family History  Problem Relation Age of Onset  . Cancer Mother     Allergies  Allergen Reactions  .  Azt (Zidovudine) Other (See Comments)    Caused bone marrow problems  2000    Objective: Temp: 97.9 F (36.6 C) (06/10 0841) Temp src: Oral (06/10 0841) BP: 139/79 mmHg (06/10 0841) Pulse Rate: 59 (06/10 0841)  General: His affect is more flat than usual but he is in no distress Oral: Full dentures. No oropharyngeal lesions Skin: No rash Lymph nodes: No palpable adenopathy Lungs: Clear Cor: Regular S1 and S2 with no murmurs. Heart sounds are distant. Abdomen: Soft and nontender with no palpable masses Joints and extremities: No abnormalities Neuro: Alert and fully oriented Mood normal  Lab Results HIV 1 RNA Quant (copies/mL)  Date Value    08/07/2012 <20   01/16/2012 <20      CD4 T Cell Abs (cmm)  Date Value  08/07/2012 410   01/16/2012 750   07/17/2011 489      Lab Results  Component Value Date   WBC 7.6 08/07/2012   HGB 17.0 08/07/2012   HCT 48.6 08/07/2012   MCV 96.4 08/07/2012   PLT 218 08/07/2012   BMET    Component Value Date/Time   NA 140 08/07/2012 0856   NA 139 12/20/2011   K 4.2 08/07/2012 0856   CL 105 08/07/2012 0856   CO2 26 08/07/2012 0856   GLUCOSE 96 08/07/2012 0856   BUN 17 08/07/2012 0856   BUN 12 12/20/2011   CREATININE 1.27 08/07/2012 0856   CREATININE 1.2 12/20/2011   CREATININE 1.12 11/10/2007 2105   CALCIUM 10.1 08/07/2012 0856   GFRNONAA >60 11/10/2007 2105   GFRAA  Value: >60        The eGFR has been calculated using the MDRD equation. This calculation has not been validated in all clinical 11/10/2007 2105   Lab Results  Component Value Date   ALT 10 08/07/2012   AST 13 08/07/2012   ALKPHOS 87 08/07/2012   BILITOT 0.6 08/07/2012   Lab Results  Component Value Date   CHOL 196 08/07/2012   HDL 32* 08/07/2012   LDLCALC 108* 08/07/2012   TRIG 282* 08/07/2012   CHOLHDL 6.1 08/07/2012   Assessment: I do not know the cause of his recent symptoms. Certainly the inadvertent increase dose of Lamictal could be a factor. I doubt that this is related to his HIV he has his adherence is excellent and his viral load has been undetectable for quite a long time. I do not find any clinical evidence of cancer.  Plan: 1. Repeat CD4 count, viral load, CBC, complete metabolic panel 2. Followup in 2 days   Cliffton Asters, MD [facility] for Infectious Disease [facility] [phone] pager   [phone] cell 10/29/2012, 9:41 AM

## 2012-10-30 LAB — T-HELPER CELL (CD4) - (RCID CLINIC ONLY): CD4 % Helper T Cell: 21 % — ABNORMAL LOW (ref 33–55)

## 2012-10-30 LAB — HIV-1 RNA QUANT-NO REFLEX-BLD
HIV 1 RNA Quant: <20 copies/mL (ref <20)
HIV-1 RNA Quant, Log: <1.3 {Log} (ref <1.30)

## 2012-10-31 ENCOUNTER — Encounter: Payer: Self-pay | Admitting: Internal Medicine

## 2012-10-31 ENCOUNTER — Ambulatory Visit (INDEPENDENT_AMBULATORY_CARE_PROVIDER_SITE_OTHER): Payer: Medicare PPO | Admitting: Internal Medicine

## 2012-10-31 VITALS — BP 141/78 | HR 60 | Temp 98.0°F | Ht 66.0 in | Wt 135.2 lb

## 2012-10-31 DIAGNOSIS — N289 Disorder of kidney and ureter, unspecified: Secondary | ICD-10-CM

## 2012-10-31 DIAGNOSIS — N189 Chronic kidney disease, unspecified: Secondary | ICD-10-CM | POA: Insufficient documentation

## 2012-10-31 NOTE — Progress Notes (Signed)
Patient ID: [name] Kim, male   DOB: 05/16/1951, 62 y.o.   MRN: [id number]          [facility] for Infectious Disease  Patient Active Problem List   Diagnosis Date Noted  . Human immunodeficiency virus (HIV) disease 01/23/2012    Priority: High  . Acute renal insufficiency 10/31/2012  . Other malaise and fatigue 10/29/2012  . Weight loss, unintentional 10/29/2012  . Dyslipidemia 02/08/2012  . Coronary artery disease 01/23/2012  . Seizure disorder 01/23/2012    Patient's Medications  New Prescriptions   No medications on file  Previous Medications   ASPIRIN 81 MG TABLET    Take 81 mg by mouth daily.   EMTRICITABINE-TENOFOVIR (TRUVADA) 200-300 MG PER TABLET    Take 1 tablet by mouth daily.   ESOMEPRAZOLE (NEXIUM) 40 MG CAPSULE    Take 40 mg by mouth daily before breakfast.   LAMOTRIGINE (LAMICTAL) 100 MG TABLET    Take 100 mg by mouth 2 (two) times daily.   RALTEGRAVIR (ISENTRESS) 400 MG TABLET    Take 1 tablet (400 mg total) by mouth 2 (two) times daily.  Modified Medications   No medications on file  Discontinued Medications   No medications on file    Subjective: [name] Kim is in for his regular followup visit. He is feeling much better and back to normal since his visit 2 days ago. His appetite is improved and he is gaining weight again. He is passing a normal amount of urine and feels great.  Review of Systems: Pertinent items are noted in HPI.  Past Medical History  Diagnosis Date  . HIV infection   . Hyperlipidemia   . Seizures   . CAD (coronary artery disease)   . Chronic kidney disease     History  Substance Use Topics  . Smoking status: Former Smoker    Types: Cigarettes    Quit date: 05/22/2004  . Smokeless tobacco: Never Used  . Alcohol Use: Yes    Family History  Problem Relation Age of Onset  . Cancer Mother     Allergies  Allergen Reactions  . Azt (Zidovudine) Other (See Comments)    Caused bone marrow problems  2000     Objective: Temp: 98 F (36.7 C) (06/12 1626) Temp src: Oral (06/12 1626) BP: 141/78 mmHg (06/12 1626) Pulse Rate: 60 (06/12 1626)  General: He is joking in good spirits Oral: No oropharyngeal lesions Skin: No rash Lymph nodes: No palpable adenopathy Lungs: Clear Cor: Regular S1 and S2 no murmurs Abdomen: Nontender Mood and affect normal  Lab Results HIV 1 RNA Quant (copies/mL)  Date Value  10/29/2012 <20   08/07/2012 <20   01/16/2012 <20      CD4 T Cell Abs (cmm)  Date Value  10/29/2012 560   08/07/2012 410   01/16/2012 750      Lab Results  Component Value Date   WBC 8.1 10/29/2012   HGB 17.7* 10/29/2012   HCT 48.5 10/29/2012   MCV 95.3 10/29/2012   PLT 230 10/29/2012   BMET    Component Value Date/Time   NA 138 10/29/2012 0924   NA 139 12/20/2011   K 4.4 10/29/2012 0924   CL 102 10/29/2012 0924   CO2 26 10/29/2012 0924   GLUCOSE 81 10/29/2012 0924   BUN 13 10/29/2012 0924   BUN 12 12/20/2011   CREATININE 1.39* 10/29/2012 0924   CREATININE 1.2 12/20/2011   CREATININE 1.12 11/10/2007 2105   CALCIUM 10.1 10/29/2012  2130   GFRNONAA >60 11/10/2007 2105   GFRAA  Value: >60        The eGFR has been calculated using the MDRD equation. This calculation has not been validated in all clinical 11/10/2007 2105   Lab Results  Component Value Date   ALT 11 10/29/2012   AST 15 10/29/2012   ALKPHOS 100 10/29/2012   BILITOT 1.1 10/29/2012   Assessment: I suspect that his transient fatigue and weight loss related to the non-a double dose of Lamictal inadvertently and he is now improving after he was changed back to his normal dose.  Plan: 1. Continue current medications at current doses 2. Followup is planned after lab work in 6 months   Cliffton Asters, MD [facility] for Infectious Disease [facility] [phone] pager   [phone] cell 10/31/2012, 4:56 PM

## 2012-11-11 ENCOUNTER — Encounter: Payer: Self-pay | Admitting: Internal Medicine

## 2012-12-04 ENCOUNTER — Other Ambulatory Visit (INDEPENDENT_AMBULATORY_CARE_PROVIDER_SITE_OTHER): Payer: Medicare PPO

## 2012-12-04 DIAGNOSIS — E785 Hyperlipidemia, unspecified: Secondary | ICD-10-CM

## 2012-12-04 DIAGNOSIS — I2510 Atherosclerotic heart disease of native coronary artery without angina pectoris: Secondary | ICD-10-CM

## 2012-12-04 LAB — LIPID PANEL
Total CHOL/HDL Ratio: 6
Triglycerides: 229 mg/dL — ABNORMAL HIGH (ref 0.0–149.0)
VLDL: 45.8 mg/dL — ABNORMAL HIGH (ref 0.0–40.0)

## 2012-12-04 LAB — BASIC METABOLIC PANEL
BUN: 13 mg/dL (ref 6–23)
CO2: 29 mEq/L (ref 19–32)
Chloride: 103 mEq/L (ref 96–112)
Creatinine, Ser: 1.3 mg/dL (ref 0.4–1.5)
Potassium: 3.8 mEq/L (ref 3.5–5.1)

## 2012-12-04 LAB — HEPATIC FUNCTION PANEL
AST: 14 U/L (ref 0–37)
Albumin: 4.3 g/dL (ref 3.5–5.2)
Total Bilirubin: 0.7 mg/dL (ref 0.3–1.2)

## 2012-12-04 LAB — LDL CHOLESTEROL, DIRECT: Direct LDL: 127.4 mg/dL

## 2012-12-11 ENCOUNTER — Encounter: Payer: Self-pay | Admitting: Cardiology

## 2012-12-11 ENCOUNTER — Ambulatory Visit (INDEPENDENT_AMBULATORY_CARE_PROVIDER_SITE_OTHER): Payer: Medicare PPO | Admitting: Cardiology

## 2012-12-11 VITALS — BP 130/69 | HR 68 | Ht 66.0 in | Wt 133.0 lb

## 2012-12-11 DIAGNOSIS — I2510 Atherosclerotic heart disease of native coronary artery without angina pectoris: Secondary | ICD-10-CM

## 2012-12-11 DIAGNOSIS — E785 Hyperlipidemia, unspecified: Secondary | ICD-10-CM

## 2012-12-11 MED ORDER — ATORVASTATIN CALCIUM 20 MG PO TABS: 20.0000 mg | ORAL_TABLET | Freq: Every day | ORAL | Status: DC

## 2012-12-11 MED ORDER — OMEGA-3-ACID ETHYL ESTERS 1 G PO CAPS: 1.0000 g | ORAL_CAPSULE | Freq: Two times a day (BID) | ORAL | Status: DC

## 2012-12-11 NOTE — Patient Instructions (Signed)
Do not take simvastatin.  Start lipitor 20 mg daily  Resume lovaza 1 gram twice a day.  We will check lab work in 3 months.

## 2012-12-11 NOTE — Progress Notes (Signed)
[name] Kim Date of Birth: 1950-11-27 Medical Record #[id number]  History of Present Illness: [name] Kim is seen for followup of CAD today. He is status post coronary bypass surgery in 2007. He had a normal exercise stress test in 2011. He has a history of chronic HIV infection and is on antiviral therapy. Previously he was taking Vytorin and Lovaza. His LDL was down to 45. I decided to switch him to simvastatin 40 mg per day but he did not tolerate this well. He reports he developed personality changes, trouble with his balance, and myalgias on simvastatin alone. We recently repeated fasting lab work on no medication to see what his baseline was. He denies any chest pain or shortness of breath. On our scales today we noted that he was down 7 pounds. He is very concerned about this weight loss. He denies any fever or chills. No recent infections. Appetite has been good but he has reduced his intake of ice cream.  Current Outpatient Prescriptions on File Prior to Visit  Medication Sig Dispense Refill  . aspirin 81 MG tablet Take 81 mg by mouth daily.      [name] emtricitabine-tenofovir (TRUVADA) 200-300 MG per tablet Take 1 tablet by mouth daily.  30 tablet  prn  . lamoTRIgine (LAMICTAL) 100 MG tablet Take 100 mg by mouth 2 (two) times daily.      . raltegravir (ISENTRESS) 400 MG tablet Take 1 tablet (400 mg total) by mouth 2 (two) times daily.  60 tablet  prn   No current facility-administered medications on file prior to visit.    Allergies  Allergen Reactions  . Azt (Zidovudine) Other (See Comments)    Caused bone marrow problems  2000    Past Medical History  Diagnosis Date  . HIV infection   . Hyperlipidemia   . Seizures   . CAD (coronary artery disease)   . Chronic kidney disease     Past Surgical History  Procedure Laterality Date  . Coronary artery bypass graft  08-09-2005    LIMA-LAD, SVG-diagonal, SVG-OM  . Appendectomy  1970  . Tonsilectomy, adenoidectomy, bilateral  myringotomy and tubes  08-1964    History  Smoking status  . Former Smoker  . Types: Cigarettes  . Quit date: 05/22/2004  Smokeless tobacco  . Never Used    History  Alcohol Use  . Yes    Family History  Problem Relation Age of Onset  . Cancer Mother     Review of Systems: As noted in history of present illness. All other systems were reviewed and are negative.  Physical Exam: BP 130/69  Pulse 68  Ht 5\' 6"  (1.676 m)  Wt 133 lb (60.328 kg)  BMI 21.48 kg/m2 He is a thin white male in no acute distress.The patient is alert and oriented x 3.   The skin is warm and dry.  The HEENT exam reveals that the sclera are nonicteric.  The mucous membranes are moist.  The carotids are 2+ without bruits.  There is no thyromegaly.  There is no JVD.  The lungs are clear.  The chest wall is non tender.  The heart exam reveals a regular rate with a normal S1 and S2.  There are no murmurs, gallops, or rubs.  The PMI is not displaced.   Abdominal exam reveals good bowel sounds.  There is no guarding or rebound.  There is no hepatosplenomegaly or tenderness.  There are no masses.  Exam of the legs reveal no  clubbing, cyanosis, or edema.  The legs are without rashes.  The distal pulses are intact.  Cranial nerves II - XII are intact.  Motor and sensory functions are intact.  The gait is normal.  LABORATORY DATA:  Lab Results  Component Value Date   WBC 8.1 10/29/2012   HGB 17.7* 10/29/2012   HCT 48.5 10/29/2012   PLT 230 10/29/2012   GLUCOSE 100* 12/04/2012   CHOL 190 12/04/2012   TRIG 229.0* 12/04/2012   HDL 31.00* 12/04/2012   LDLDIRECT 127.4 12/04/2012   LDLCALC 108* 08/07/2012   ALT 12 12/04/2012   AST 14 12/04/2012   NA 139 12/04/2012   K 3.8 12/04/2012   CL 103 12/04/2012   CREATININE 1.3 12/04/2012   BUN 13 12/04/2012   CO2 29 12/04/2012     Assessment / Plan: 1. Coronary disease status post CABG in 2007. Last stress test in 2011 was normal. We will consider repeat stress testing in one year.  We will continue on his aspirin.  2. Dyslipidemia. I recommended Lipitor 20 mg per day and we will resume Lovaza 2 g per day. We'll repeat lipid levels in 3 months.  3. HIV disease. Continue antiviral therapy.

## 2013-02-12 ENCOUNTER — Other Ambulatory Visit: Payer: Self-pay | Admitting: *Deleted

## 2013-02-12 DIAGNOSIS — B20 Human immunodeficiency virus [HIV] disease: Secondary | ICD-10-CM

## 2013-02-12 MED ORDER — EMTRICITABINE-TENOFOVIR DF 200-300 MG PO TABS: 1.0000 | ORAL_TABLET | Freq: Every day | ORAL | Status: DC

## 2013-02-12 MED ORDER — RALTEGRAVIR POTASSIUM 400 MG PO TABS: 400.0000 mg | ORAL_TABLET | Freq: Two times a day (BID) | ORAL | Status: DC

## 2013-02-19 ENCOUNTER — Other Ambulatory Visit (INDEPENDENT_AMBULATORY_CARE_PROVIDER_SITE_OTHER): Payer: Medicare PPO

## 2013-02-19 DIAGNOSIS — I2510 Atherosclerotic heart disease of native coronary artery without angina pectoris: Secondary | ICD-10-CM

## 2013-02-19 DIAGNOSIS — E785 Hyperlipidemia, unspecified: Secondary | ICD-10-CM

## 2013-02-19 LAB — HEPATIC FUNCTION PANEL
ALT: 14 U/L (ref 0–53)
AST: 17 U/L (ref 0–37)
Alkaline Phosphatase: 78 U/L (ref 39–117)
Bilirubin, Direct: 0 mg/dL (ref 0.0–0.3)
Total Bilirubin: 0.7 mg/dL (ref 0.3–1.2)

## 2013-02-19 LAB — BASIC METABOLIC PANEL
BUN: 11 mg/dL (ref 6–23)
CO2: 30 mEq/L (ref 19–32)
Calcium: 9.5 mg/dL (ref 8.4–10.5)
Chloride: 102 mEq/L (ref 96–112)
Creatinine, Ser: 1.4 mg/dL (ref 0.4–1.5)
Glucose, Bld: 92 mg/dL (ref 70–99)

## 2013-02-19 LAB — LIPID PANEL
Total CHOL/HDL Ratio: 6
Triglycerides: 208 mg/dL — ABNORMAL HIGH (ref 0.0–149.0)

## 2013-02-21 ENCOUNTER — Other Ambulatory Visit: Payer: Self-pay

## 2013-02-21 DIAGNOSIS — I2510 Atherosclerotic heart disease of native coronary artery without angina pectoris: Secondary | ICD-10-CM

## 2013-02-21 DIAGNOSIS — E785 Hyperlipidemia, unspecified: Secondary | ICD-10-CM

## 2013-02-25 ENCOUNTER — Other Ambulatory Visit: Payer: Commercial Managed Care - HMO

## 2013-02-25 DIAGNOSIS — B20 Human immunodeficiency virus [HIV] disease: Secondary | ICD-10-CM

## 2013-02-26 LAB — HIV-1 RNA QUANT-NO REFLEX-BLD: HIV 1 RNA Quant: <20 copies/mL (ref <20)

## 2013-03-11 ENCOUNTER — Encounter: Payer: Self-pay | Admitting: Internal Medicine

## 2013-03-11 ENCOUNTER — Ambulatory Visit (INDEPENDENT_AMBULATORY_CARE_PROVIDER_SITE_OTHER): Payer: Commercial Managed Care - HMO | Admitting: Internal Medicine

## 2013-03-11 VITALS — BP 144/82 | HR 64 | Temp 98.0°F | Ht 66.5 in | Wt 135.0 lb

## 2013-03-11 DIAGNOSIS — B20 Human immunodeficiency virus [HIV] disease: Secondary | ICD-10-CM

## 2013-03-11 DIAGNOSIS — Z23 Encounter for immunization: Secondary | ICD-10-CM

## 2013-03-11 NOTE — Progress Notes (Signed)
Patient ID: [name] Kim, male   DOB: Sep 02, 1950, 62 y.o.   MRN: [id number]          [facility] for Infectious Disease  Patient Active Problem List   Diagnosis Date Noted  . Human immunodeficiency virus (HIV) disease 01/23/2012    Priority: High  . Acute renal insufficiency 10/31/2012  . Other malaise and fatigue 10/29/2012  . Weight loss, unintentional 10/29/2012  . Dyslipidemia 02/08/2012  . Coronary artery disease 01/23/2012  . Seizure disorder 01/23/2012    Patient's Medications  New Prescriptions   No medications on file  Previous Medications   ASPIRIN 81 MG TABLET    Take 81 mg by mouth daily.   ATORVASTATIN (LIPITOR) 20 MG TABLET    Take 1 tablet (20 mg total) by mouth daily.   EMTRICITABINE-TENOFOVIR (TRUVADA) 200-300 MG PER TABLET    Take 1 tablet by mouth daily.   LAMOTRIGINE (LAMICTAL) 100 MG TABLET    Take 100 mg by mouth 2 (two) times daily.   OMEGA-3 ACID ETHYL ESTERS (LOVAZA) 1 G CAPSULE    Take 1 capsule (1 g total) by mouth 2 (two) times daily.   RALTEGRAVIR (ISENTRESS) 400 MG TABLET    Take 1 tablet (400 mg total) by mouth 2 (two) times daily.  Modified Medications   No medications on file  Discontinued Medications   LAMOTRIGINE (LAMICTAL) 100 MG TABLET    Take 100 mg by mouth 2 (two) times daily.    Subjective: [name] Kim is in for his routine visit. As usual, he has not missed any doses of Truvada or Isentress since his last visit. His appetite is excellent and his weight has been stable between 133 and 135. Review of Systems: Pertinent items are noted in HPI.  Past Medical History  Diagnosis Date  . HIV infection   . Hyperlipidemia   . Seizures   . CAD (coronary artery disease)   . Chronic kidney disease     History  Substance Use Topics  . Smoking status: Former Smoker    Types: Cigarettes    Quit date: 05/22/2004  . Smokeless tobacco: Never Used  . Alcohol Use: Yes    Family History  Problem Relation Age of Onset  . Cancer Mother       Allergies  Allergen Reactions  . Azt [Zidovudine] Other (See Comments)    Caused bone marrow problems  2000    Objective: Temp: 98 F (36.7 C) (10/21 0854) Temp src: Oral (10/21 0854) BP: 144/82 mmHg (10/21 0854) Pulse Rate: 64 (10/21 0854)  General: His weight is up 2 pounds to 135 Oral: No oropharyngeal lesions Skin: No rash Lungs: Clear Cor: Regular S1 and S2 no murmurs And affect: Normal  Lab Results HIV 1 RNA Quant (copies/mL)  Date Value  02/25/2013 <20   10/29/2012 <20   08/07/2012 <20      CD4 T Cell Abs (/uL)  Date Value  02/25/2013 590   10/29/2012 560   08/07/2012 410      Assessment: His HIV infection remains under excellent control. Gaspar Garbe the option of simplifying his regimen to a once daily combination of Truvada and Tivicay but he prefers to stay on his tried-and-true regimen.  He is not losing any more weight recently. I am comfortable simply observing and monitoring his weight at this time.  Plan: 1. Continue Truvada and Isentress 2. Influenza vaccination today 3. Followup after lab work in 6 months   Cliffton Asters, MD [facility] for Infectious  Disease [facility] [phone] pager   [phone] cell 03/11/2013, 9:18 AM

## 2013-04-09 ENCOUNTER — Other Ambulatory Visit: Payer: Self-pay | Admitting: Licensed Clinical Social Worker

## 2013-04-09 DIAGNOSIS — B20 Human immunodeficiency virus [HIV] disease: Secondary | ICD-10-CM

## 2013-04-09 MED ORDER — RALTEGRAVIR POTASSIUM 400 MG PO TABS: 400.0000 mg | ORAL_TABLET | Freq: Two times a day (BID) | ORAL | Status: DC

## 2013-04-09 MED ORDER — EMTRICITABINE-TENOFOVIR DF 200-300 MG PO TABS: 1.0000 | ORAL_TABLET | Freq: Every day | ORAL | Status: DC

## 2013-05-27 ENCOUNTER — Other Ambulatory Visit (INDEPENDENT_AMBULATORY_CARE_PROVIDER_SITE_OTHER): Payer: Medicare PPO

## 2013-05-27 DIAGNOSIS — E785 Hyperlipidemia, unspecified: Secondary | ICD-10-CM

## 2013-05-27 DIAGNOSIS — I2510 Atherosclerotic heart disease of native coronary artery without angina pectoris: Secondary | ICD-10-CM

## 2013-05-27 LAB — LIPID PANEL
Cholesterol: 107 mg/dL (ref 0–200)
HDL: 34.2 mg/dL — ABNORMAL LOW (ref >39.00)
LDL Cholesterol: 46 mg/dL (ref 0–99)
Total CHOL/HDL Ratio: 3
Triglycerides: 135 mg/dL (ref 0.0–149.0)
VLDL: 27 mg/dL (ref 0.0–40.0)

## 2013-05-27 LAB — HEPATIC FUNCTION PANEL
ALT: 15 U/L (ref 0–53)
AST: 17 U/L (ref 0–37)
Albumin: 4.2 g/dL (ref 3.5–5.2)
Alkaline Phosphatase: 81 U/L (ref 39–117)
BILIRUBIN TOTAL: 1 mg/dL (ref 0.3–1.2)
Bilirubin, Direct: 0.1 mg/dL (ref 0.0–0.3)
Total Protein: 7.4 g/dL (ref 6.0–8.3)

## 2013-05-27 LAB — BASIC METABOLIC PANEL
BUN: 14 mg/dL (ref 6–23)
CALCIUM: 9.3 mg/dL (ref 8.4–10.5)
CO2: 30 meq/L (ref 19–32)
Chloride: 101 mEq/L (ref 96–112)
Creatinine, Ser: 1.3 mg/dL (ref 0.4–1.5)
GFR: 58.71 mL/min — ABNORMAL LOW (ref >60.00)
Glucose, Bld: 104 mg/dL — ABNORMAL HIGH (ref 70–99)
Potassium: 3.8 mEq/L (ref 3.5–5.1)
Sodium: 138 mEq/L (ref 135–145)

## 2013-07-02 ENCOUNTER — Ambulatory Visit (INDEPENDENT_AMBULATORY_CARE_PROVIDER_SITE_OTHER): Payer: Medicare PPO | Admitting: Cardiology

## 2013-07-02 ENCOUNTER — Encounter: Payer: Self-pay | Admitting: Cardiology

## 2013-07-02 VITALS — BP 132/70 | HR 58 | Ht 67.0 in | Wt 139.4 lb

## 2013-07-02 DIAGNOSIS — E785 Hyperlipidemia, unspecified: Secondary | ICD-10-CM

## 2013-07-02 DIAGNOSIS — I2510 Atherosclerotic heart disease of native coronary artery without angina pectoris: Secondary | ICD-10-CM

## 2013-07-02 NOTE — Patient Instructions (Signed)
We will schedule you for a stress Echo  I will see you in 6 months.

## 2013-07-02 NOTE — Progress Notes (Signed)
[name] Kim Date of Birth: [birth year] Medical Record #[id number]  History of Present Illness: Mr. Pinard is seen for followup of CAD today. He is status post coronary bypass surgery in 2007. He had a normal nuclear stress test in 2012. He has a history of chronic HIV infection and is on antiviral therapy.  He denies any chest pain or shortness of breath. He has been able to gain some weight back. He is tolerating lipitor well. He did have a recent bout of bronchitis but has largely recovered.  Current Outpatient Prescriptions on File Prior to Visit  Medication Sig Dispense Refill  . aspirin 81 MG tablet Take 81 mg by mouth daily.      [name] atorvastatin (LIPITOR) 20 MG tablet Take 1 tablet (20 mg total) by mouth daily.  90 tablet  3  . emtricitabine-tenofovir (TRUVADA) 200-300 MG per tablet Take 1 tablet by mouth daily.  30 tablet  prn  . lamoTRIgine (LAMICTAL) 100 MG tablet Take 100 mg by mouth 2 (two) times daily.      [name] omega-3 acid ethyl esters (LOVAZA) 1 G capsule Take 1 capsule (1 g total) by mouth 2 (two) times daily.  180 capsule  3  . raltegravir (ISENTRESS) 400 MG tablet Take 1 tablet (400 mg total) by mouth 2 (two) times daily.  60 tablet  prn   No current facility-administered medications on file prior to visit.    Allergies  Allergen Reactions  . Azt [Zidovudine] Other (See Comments)    Caused bone marrow problems  2000    Past Medical History  Diagnosis Date  . HIV infection   . Hyperlipidemia   . Seizures   . CAD (coronary artery disease)   . Chronic kidney disease     Past Surgical History  Procedure Laterality Date  . Coronary artery bypass graft  08-09-2005    LIMA-LAD, SVG-diagonal, SVG-OM  . Appendectomy  1970  . Tonsilectomy, adenoidectomy, bilateral myringotomy and tubes  08-1964    History  Smoking status  . Former Smoker  . Types: Cigarettes  . Quit date: 05/22/2004  Smokeless tobacco  . Never Used    History  Alcohol Use  . Yes    Family  History  Problem Relation Age of Onset  . Cancer Mother     Review of Systems: As noted in history of present illness. All other systems were reviewed and are negative.  Physical Exam: BP 132/70  Pulse 58  Ht 5\' 7"  (1.702 m)  Wt 139 lb 6.4 oz (63.231 kg)  BMI 21.83 kg/m2 He is a thin white male in no acute distress.  The HEENT exam is normal.  The carotids are 2+ without bruits.  There is no thyromegaly.  There is no JVD.  The lungs are clear. The heart exam reveals a regular rate with a normal S1 and S2.  There are no murmurs, gallops, or rubs.  The PMI is not displaced.   Abdominal exam reveals good bowel sounds.  There is no guarding or rebound.  There is no hepatosplenomegaly or tenderness.  There are no masses.  Exam of the legs reveal no clubbing, cyanosis, or edema.   The distal pulses are intact.  Cranial nerves II - XII are intact.  Motor and sensory functions are intact.  The gait is normal.  LABORATORY DATA:  Lab Results  Component Value Date   WBC 8.1 10/29/2012   HGB 17.7* 10/29/2012   HCT 48.5 10/29/2012   PLT  230 10/29/2012   GLUCOSE 104* 05/27/2013   CHOL 107 05/27/2013   TRIG 135.0 05/27/2013   HDL 34.20* 05/27/2013   LDLDIRECT 126.1 02/19/2013   LDLCALC 46 05/27/2013   ALT 15 05/27/2013   AST 17 05/27/2013   NA 138 05/27/2013   K 3.8 05/27/2013   CL 101 05/27/2013   CREATININE 1.3 05/27/2013   BUN 14 05/27/2013   CO2 30 05/27/2013     Assessment / Plan: 1. Coronary disease status post CABG in 2007. Last stress test in 2012 was normal. We will plan repeat stress testing now with a stress Echo.  We will continue on his aspirin and statin Rx.  2. Dyslipidemia. continue Lipitor 20 mg per day and  Lovaza 2 g per day.   3. HIV disease. Continue antiviral therapy.

## 2013-07-24 ENCOUNTER — Ambulatory Visit (HOSPITAL_COMMUNITY): Payer: Medicare PPO | Attending: Cardiovascular Disease | Admitting: Radiology

## 2013-07-24 ENCOUNTER — Encounter: Payer: Self-pay | Admitting: Cardiovascular Disease

## 2013-07-24 DIAGNOSIS — E785 Hyperlipidemia, unspecified: Secondary | ICD-10-CM

## 2013-07-24 DIAGNOSIS — I25810 Atherosclerosis of coronary artery bypass graft(s) without angina pectoris: Secondary | ICD-10-CM | POA: Diagnosis present

## 2013-07-24 DIAGNOSIS — I2510 Atherosclerotic heart disease of native coronary artery without angina pectoris: Secondary | ICD-10-CM

## 2013-07-24 NOTE — Progress Notes (Signed)
Stress Echocardiogram performed.  

## 2013-09-09 ENCOUNTER — Other Ambulatory Visit: Payer: Commercial Managed Care - HMO

## 2013-09-09 DIAGNOSIS — Z79899 Other long term (current) drug therapy: Secondary | ICD-10-CM

## 2013-09-09 DIAGNOSIS — Z113 Encounter for screening for infections with a predominantly sexual mode of transmission: Secondary | ICD-10-CM

## 2013-09-09 DIAGNOSIS — B20 Human immunodeficiency virus [HIV] disease: Secondary | ICD-10-CM

## 2013-09-09 LAB — LIPID PANEL
Cholesterol: 121 mg/dL (ref 0–200)
HDL: 40 mg/dL (ref >39)
LDL Cholesterol: 64 mg/dL (ref 0–99)
Total CHOL/HDL Ratio: 3 Ratio
Triglycerides: 87 mg/dL (ref <150)
VLDL: 17 mg/dL (ref 0–40)

## 2013-09-09 LAB — COMPREHENSIVE METABOLIC PANEL
ALT: 12 U/L (ref 0–53)
AST: 15 U/L (ref 0–37)
Albumin: 4.6 g/dL (ref 3.5–5.2)
Alkaline Phosphatase: 73 U/L (ref 39–117)
BUN: 12 mg/dL (ref 6–23)
CALCIUM: 9.7 mg/dL (ref 8.4–10.5)
CHLORIDE: 103 meq/L (ref 96–112)
CO2: 26 meq/L (ref 19–32)
Creat: 1.2 mg/dL (ref 0.50–1.35)
Glucose, Bld: 86 mg/dL (ref 70–99)
Potassium: 4.1 mEq/L (ref 3.5–5.3)
SODIUM: 139 meq/L (ref 135–145)
TOTAL PROTEIN: 7.3 g/dL (ref 6.0–8.3)
Total Bilirubin: 0.6 mg/dL (ref 0.2–1.2)

## 2013-09-09 LAB — CBC
HCT: 46.9 % (ref 39.0–52.0)
Hemoglobin: 16.8 g/dL (ref 13.0–17.0)
MCH: 33.8 pg (ref 26.0–34.0)
MCHC: 35.8 g/dL (ref 30.0–36.0)
MCV: 94.4 fL (ref 78.0–100.0)
PLATELETS: 238 10*3/uL (ref 150–400)
RBC: 4.97 MIL/uL (ref 4.22–5.81)
RDW: 13.4 % (ref 11.5–15.5)
WBC: 6.5 10*3/uL (ref 4.0–10.5)

## 2013-09-09 LAB — RPR

## 2013-09-10 LAB — T-HELPER CELL (CD4) - (RCID CLINIC ONLY)
CD4 % Helper T Cell: 23 % — ABNORMAL LOW (ref 33–55)
CD4 T CELL ABS: 460 /uL (ref 400–2700)

## 2013-09-10 LAB — HIV-1 RNA QUANT-NO REFLEX-BLD
HIV 1 RNA Quant: <20 copies/mL (ref <20)
HIV-1 RNA Quant, Log: <1.3 {Log} (ref <1.30)

## 2013-09-23 ENCOUNTER — Ambulatory Visit: Payer: Medicare PPO | Admitting: Internal Medicine

## 2013-09-23 ENCOUNTER — Ambulatory Visit (INDEPENDENT_AMBULATORY_CARE_PROVIDER_SITE_OTHER): Payer: Commercial Managed Care - HMO | Admitting: Internal Medicine

## 2013-09-23 ENCOUNTER — Encounter: Payer: Self-pay | Admitting: Internal Medicine

## 2013-09-23 VITALS — BP 152/75 | HR 58 | Temp 98.1°F | Wt 137.0 lb

## 2013-09-23 DIAGNOSIS — B20 Human immunodeficiency virus [HIV] disease: Secondary | ICD-10-CM

## 2013-09-23 NOTE — Progress Notes (Signed)
Patient ID: [name] Kim, male   DOB: 1950/10/03, 63 y.o.   MRN: [id number] @LOGODEPT         Patient Active Problem List   Diagnosis Date Noted  . Human immunodeficiency virus (HIV) disease 01/23/2012    Priority: High  . Acute renal insufficiency 10/31/2012  . Other malaise and fatigue 10/29/2012  . Weight loss, unintentional 10/29/2012  . Dyslipidemia 02/08/2012  . Coronary artery disease 01/23/2012  . Seizure disorder 01/23/2012    Patient's Medications  New Prescriptions   No medications on file  Previous Medications   ASPIRIN 81 MG TABLET    Take 81 mg by mouth daily.   ATORVASTATIN (LIPITOR) 20 MG TABLET    Take 1 tablet (20 mg total) by mouth daily.   EMTRICITABINE-TENOFOVIR (TRUVADA) 200-300 MG PER TABLET    Take 1 tablet by mouth daily.   LAMOTRIGINE (LAMICTAL) 100 MG TABLET    Take 100 mg by mouth 2 (two) times daily.   OMEGA-3 ACID ETHYL ESTERS (LOVAZA) 1 G CAPSULE    Take 1 capsule (1 g total) by mouth 2 (two) times daily.   RALTEGRAVIR (ISENTRESS) 400 MG TABLET    Take 1 tablet (400 mg total) by mouth 2 (two) times daily.  Modified Medications   No medications on file  Discontinued Medications   No medications on file    Subjective: [name] Kim is in for his routine visit. He denies missing any doses of his Truvada or Isentress. He is doing well without any current complaints. He had a normal stress echocardiogram done by Dr. Martinique recently.  Review of Systems: Pertinent items are noted in HPI.  Past Medical History  Diagnosis Date  . HIV infection   . Hyperlipidemia   . Seizures   . CAD (coronary artery disease)   . Chronic kidney disease     History  Substance Use Topics  . Smoking status: Former Smoker    Types: Cigarettes    Quit date: 05/22/2004  . Smokeless tobacco: Never Used  . Alcohol Use: Yes    Family History  Problem Relation Age of Onset  . Cancer Mother     Allergies  Allergen Reactions  . Azt [Zidovudine] Other (See Comments)    Caused bone marrow problems  2000    Objective: Temp: 98.1 F (36.7 C) (05/05 0828) Temp src: Oral (05/05 0828) BP: 152/75 mmHg (05/05 0828) Pulse Rate: 58 (05/05 0828) Body mass index is 21.45 kg/(m^2).  General: He is in good spirits Oral: No oropharyngeal lesions Skin: No rash Lungs: Clear Cor: Regular S1 and S2 with no murmurs Mood and affect: bright  Lab Results Lab Results  Component Value Date   WBC 6.5 09/09/2013   HGB 16.8 09/09/2013   HCT 46.9 09/09/2013   MCV 94.4 09/09/2013   PLT 238 09/09/2013    Lab Results  Component Value Date   CREATININE 1.20 09/09/2013   BUN 12 09/09/2013   NA 139 09/09/2013   K 4.1 09/09/2013   CL 103 09/09/2013   CO2 26 09/09/2013    Lab Results  Component Value Date   ALT 12 09/09/2013   AST 15 09/09/2013   ALKPHOS 73 09/09/2013   BILITOT 0.6 09/09/2013    Lab Results  Component Value Date   CHOL 121 09/09/2013   HDL 40 09/09/2013   LDLCALC 64 09/09/2013   LDLDIRECT 126.1 02/19/2013   TRIG 87 09/09/2013   CHOLHDL 3.0 09/09/2013    Lab Results HIV 1 RNA Quant (  copies/mL)  Date Value  09/09/2013 <20   02/25/2013 <20   10/29/2012 <20      CD4 T Cell Abs (/uL)  Date Value  09/09/2013 460   02/25/2013 590   10/29/2012 560      Assessment: His HIV infection remains under excellent control. He is not interested in changing or simplifying his regimen.  Plan: 1. Continue current antiretroviral regimen 2. Followup in 6 months   Michel Bickers, MD [facility] for [place] [phone] pager   [phone] cell 09/23/2013, 9:21 AM

## 2013-11-03 ENCOUNTER — Other Ambulatory Visit: Payer: Self-pay | Admitting: Cardiology

## 2013-11-28 ENCOUNTER — Other Ambulatory Visit: Payer: Self-pay | Admitting: Cardiology

## 2013-12-23 ENCOUNTER — Other Ambulatory Visit: Payer: Self-pay | Admitting: Cardiology

## 2013-12-26 ENCOUNTER — Ambulatory Visit (INDEPENDENT_AMBULATORY_CARE_PROVIDER_SITE_OTHER): Payer: Medicare PPO | Admitting: Cardiovascular Disease

## 2013-12-26 ENCOUNTER — Encounter: Payer: Self-pay | Admitting: Cardiovascular Disease

## 2013-12-26 VITALS — BP 130/80 | HR 55 | Ht 66.0 in | Wt 138.5 lb

## 2013-12-26 DIAGNOSIS — E785 Hyperlipidemia, unspecified: Secondary | ICD-10-CM

## 2013-12-26 DIAGNOSIS — R0602 Shortness of breath: Secondary | ICD-10-CM

## 2013-12-26 DIAGNOSIS — Z951 Presence of aortocoronary bypass graft: Secondary | ICD-10-CM

## 2013-12-26 DIAGNOSIS — B20 Human immunodeficiency virus [HIV] disease: Secondary | ICD-10-CM

## 2013-12-26 DIAGNOSIS — I2583 Coronary atherosclerosis due to lipid rich plaque: Principal | ICD-10-CM

## 2013-12-26 DIAGNOSIS — I2510 Atherosclerotic heart disease of native coronary artery without angina pectoris: Secondary | ICD-10-CM

## 2013-12-26 NOTE — Assessment & Plan Note (Signed)
Cholesterol is at goal on the current lipid regimen. No changes to the medications were made.  

## 2013-12-26 NOTE — Progress Notes (Signed)
   Patient ID: [name] Kim, male    DOB: [birth year], 63 y.o.   MRN: [id number]  HPI Comments: Mr. Varkey has a history of coronary artery disease, bypass surgery in 2007, chronic HIV infection on antibiotic therapy, Long history of smoking, mild COPD Prior stress test in 2012 showing no ischemia who presents to establish care in the [place] office. He recently moved to the area from [place]  He does report having a stress test and echocardiogram while in [place]. We have requested these records for our system  In general he states that he is doing well. Blood pressure has been well-controlled. He denies any symptoms of chest pain or shortness of breath with exertion. If he does extreme heavy lifting, he might have some shortness of breath.  He does report having very vivid dreams, punching and kicking sometimes at nighttime. It can be scary.  EKG shows sinus bradycardia with rate 55 beats per minute, no significant ST or T wave changes EKG unchanged from 2006    Outpatient Encounter Prescriptions as of 12/26/2013  Medication Sig  . aspirin 81 MG tablet Take 81 mg by mouth daily.  [name] atorvastatin (LIPITOR) 20 MG tablet TAKE 1 TABLET BY MOUTH EVERY DAY  . emtricitabine-tenofovir (TRUVADA) 200-300 MG per tablet Take 1 tablet by mouth daily.  [name] lamoTRIgine (LAMICTAL) 100 MG tablet Take 100 mg by mouth 2 (two) times daily.  [name] omega-3 acid ethyl esters (LOVAZA) 1 G capsule TAKE 1 CAPSULE BY MOUTH TWICE DAILY  . raltegravir (ISENTRESS) 400 MG tablet Take 1 tablet (400 mg total) by mouth 2 (two) times daily.    Review of Systems  Constitutional: Negative.   HENT: Negative.   Eyes: Negative.   Respiratory: Negative.        Shortness of breath with heavy exertion  Cardiovascular: Negative.   Gastrointestinal: Negative.   Endocrine: Negative.   Musculoskeletal: Negative.   Skin: Negative.   Allergic/Immunologic: Negative.   Neurological: Negative.   Hematological: Negative.    Psychiatric/Behavioral: Negative.   All other systems reviewed and are negative.    BP 130/80  Pulse 55  Ht 5\' 6"  (1.676 m)  Wt 138 lb 8 oz (62.823 kg)  BMI 22.37 kg/m2  Physical Exam  Nursing note and vitals reviewed. Constitutional: He is oriented to person, place, and time. He appears well-developed and well-nourished.  HENT:  Head: Normocephalic.  Nose: Nose normal.  Mouth/Throat: Oropharynx is clear and moist.  Eyes: Conjunctivae are normal. Pupils are equal, round, and reactive to light.  Neck: Normal range of motion. Neck supple. No JVD present.  Cardiovascular: Normal rate, regular rhythm, S1 normal, S2 normal, normal heart sounds and intact distal pulses.  Exam reveals no gallop and no friction rub.   No murmur heard. Pulmonary/Chest: Effort normal and breath sounds normal. No respiratory distress. He has no wheezes. He has no rales. He exhibits no tenderness.  Abdominal: Soft. Bowel sounds are normal. He exhibits no distension. There is no tenderness.  Musculoskeletal: Normal range of motion. He exhibits no edema and no tenderness.  Lymphadenopathy:    He has no cervical adenopathy.  Neurological: He is alert and oriented to person, place, and time. Coordination normal.  Skin: Skin is warm and dry. No rash noted. No erythema.  Psychiatric: He has a normal mood and affect. His behavior is normal. Judgment and thought content normal.      Assessment and Plan

## 2013-12-26 NOTE — Assessment & Plan Note (Signed)
No significant problems since his bypass surgery in 2007. Currently with no angina

## 2013-12-26 NOTE — Assessment & Plan Note (Signed)
Appears to have relatively stable CD4 cells, undetectable HIV RNA

## 2013-12-26 NOTE — Assessment & Plan Note (Signed)
Currently with no symptoms of angina. No further workup at this time. Continue current medication regimen. 

## 2013-12-26 NOTE — Patient Instructions (Signed)
You are doing well. No medication changes were made.  Please call us if you have new issues that need to be addressed before your next appt.  Your physician wants you to follow-up in: 6 months.  You will receive a reminder letter in the mail two months in advance. If you don't receive a letter, please call our office to schedule the follow-up appointment.   

## 2014-02-18 ENCOUNTER — Other Ambulatory Visit: Payer: Self-pay

## 2014-02-18 MED ORDER — ATORVASTATIN CALCIUM 20 MG PO TABS: ORAL_TABLET | ORAL | Status: DC

## 2014-02-18 NOTE — Telephone Encounter (Signed)
Pt would like 90 day supply 

## 2014-02-26 ENCOUNTER — Other Ambulatory Visit: Payer: Self-pay

## 2014-02-26 MED ORDER — OMEGA-3-ACID ETHYL ESTERS 1 G PO CAPS: ORAL_CAPSULE | ORAL | Status: DC

## 2014-03-17 ENCOUNTER — Other Ambulatory Visit: Payer: Commercial Managed Care - HMO

## 2014-03-17 DIAGNOSIS — B20 Human immunodeficiency virus [HIV] disease: Secondary | ICD-10-CM

## 2014-03-18 LAB — T-HELPER CELL (CD4) - (RCID CLINIC ONLY)
CD4 T CELL ABS: 470 /uL (ref 400–2700)
CD4 T CELL HELPER: 23 % — AB (ref 33–55)

## 2014-03-18 LAB — HIV-1 RNA QUANT-NO REFLEX-BLD: HIV 1 RNA Quant: <20 copies/mL (ref <20)

## 2014-03-31 ENCOUNTER — Encounter: Payer: Self-pay | Admitting: Internal Medicine

## 2014-03-31 ENCOUNTER — Other Ambulatory Visit: Payer: Self-pay | Admitting: *Deleted

## 2014-03-31 ENCOUNTER — Ambulatory Visit (INDEPENDENT_AMBULATORY_CARE_PROVIDER_SITE_OTHER): Payer: Medicare HMO | Admitting: Internal Medicine

## 2014-03-31 DIAGNOSIS — B20 Human immunodeficiency virus [HIV] disease: Secondary | ICD-10-CM

## 2014-03-31 MED ORDER — RALTEGRAVIR POTASSIUM 400 MG PO TABS: 400.0000 mg | ORAL_TABLET | Freq: Two times a day (BID) | ORAL | Status: DC

## 2014-03-31 MED ORDER — EMTRICITABINE-TENOFOVIR DF 200-300 MG PO TABS: 1.0000 | ORAL_TABLET | Freq: Every day | ORAL | Status: DC

## 2014-03-31 NOTE — Progress Notes (Signed)
Patient ID: [name] Kim, male   DOB: [birth year], 63 y.o.   MRN: [id number]          Patient Active Problem List   Diagnosis Date Noted  . Human immunodeficiency virus (HIV) disease 01/23/2012    Priority: High  . 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 Acute renal insufficiency 10/31/2012  . Other malaise and fatigue 10/29/2012  . Weight loss, unintentional 10/29/2012  . Dyslipidemia 02/08/2012  . Coronary artery disease 01/23/2012  . Seizure disorder 01/23/2012    Patient's Medications  New Prescriptions   No medications on file  Previous Medications   ASPIRIN 81 MG TABLET    Take 81 mg by mouth daily.   ATORVASTATIN (LIPITOR) 20 MG TABLET    TAKE 1 TABLET BY MOUTH EVERY DAY   EMTRICITABINE-TENOFOVIR (TRUVADA) 200-300 MG PER TABLET    Take 1 tablet by mouth daily.   LAMOTRIGINE (LAMICTAL) 100 MG TABLET    Take 100 mg by mouth 2 (two) times daily.   OMEGA-3 ACID ETHYL ESTERS (LOVAZA) 1 G CAPSULE    TAKE 1 CAPSULE BY MOUTH TWICE DAILY   RALTEGRAVIR (ISENTRESS) 400 MG TABLET    Take 1 tablet (400 mg total) by mouth 2 (two) times daily.  Modified Medications   No medications on file  Discontinued Medications   No medications on file    Subjective: [name] Kim is in for his routine visit. He denies missing any doses of his Truvada or Isentress. He is doing well without any current complaints. Review of Systems: Pertinent items are noted in HPI.  Past Medical History  Diagnosis Date  . HIV infection   . Hyperlipidemia   . Seizures   . CAD (coronary artery disease)   . Chronic kidney disease     History  Substance Use Topics  . Smoking status: Former Smoker    Types: Cigarettes    Quit date: 05/22/2004  . Smokeless tobacco: Never Used  . Alcohol Use: Yes    Family History  Problem Relation Age of Onset  . Cancer Mother     Allergies  Allergen Reactions  . Azt [Zidovudine] Other (See Comments)    Caused bone marrow problems  2000     Objective: Temp: 97.8 F (36.6 C) (11/10 0842) Temp Source: Oral (11/10 0842) BP: 153/79 mmHg (11/10 0848) Pulse Rate: 55 (11/10 0848) Body mass index is 22.41 kg/(m^2).  General: he is in good spirits Oral: no oropharyngeal lesions Skin: no rash Lungs: clear Cor: regular S1 and S2 no murmurs  Lab Results Lab Results  Component Value Date   WBC 6.5 09/09/2013   HGB 16.8 09/09/2013   HCT 46.9 09/09/2013   MCV 94.4 09/09/2013   PLT 238 09/09/2013    Lab Results  Component Value Date   CREATININE 1.20 09/09/2013   BUN 12 09/09/2013   NA 139 09/09/2013   K 4.1 09/09/2013   CL 103 09/09/2013   CO2 26 09/09/2013    Lab Results  Component Value Date   ALT 12 09/09/2013   AST 15 09/09/2013   ALKPHOS 73 09/09/2013   BILITOT 0.6 09/09/2013    Lab Results  Component Value Date   CHOL 121 09/09/2013   HDL 40 09/09/2013   LDLCALC 64 09/09/2013   LDLDIRECT 126.1 02/19/2013   TRIG 87 09/09/2013   CHOLHDL 3.0 09/09/2013    Lab Results HIV 1 RNA QUANT (copies/mL)  Date Value  03/17/2014 <20  09/09/2013 <20  02/25/2013 <20  CD4 T CELL ABS (/uL)  Date Value  03/17/2014 470  09/09/2013 460  02/25/2013 590     Assessment: His HIV infection remains under perfect control. He prefers to continue his current antiretroviral regimen rather than simplifying to once daily Strib[place].  Plan: 1. Continue Truvada and Isentress 2. Follow-up after blood work in [place] months   Michel Bickers, MD [facility] for [place] [phone] pager   [phone] cell 03/31/2014, 9:07 AM

## 2014-04-21 ENCOUNTER — Other Ambulatory Visit: Payer: Self-pay | Admitting: Internal Medicine

## 2014-06-02 ENCOUNTER — Ambulatory Visit: Payer: Commercial Managed Care - HMO | Admitting: Family Medicine

## 2014-06-24 ENCOUNTER — Encounter: Payer: Self-pay | Admitting: Cardiovascular Disease

## 2014-06-24 ENCOUNTER — Ambulatory Visit (INDEPENDENT_AMBULATORY_CARE_PROVIDER_SITE_OTHER): Payer: Medicare PPO | Admitting: Cardiovascular Disease

## 2014-06-24 VITALS — BP 122/60 | HR 52 | Ht 66.0 in | Wt 140.8 lb

## 2014-06-24 DIAGNOSIS — B20 Human immunodeficiency virus [HIV] disease: Secondary | ICD-10-CM

## 2014-06-24 DIAGNOSIS — Z951 Presence of aortocoronary bypass graft: Secondary | ICD-10-CM

## 2014-06-24 DIAGNOSIS — E785 Hyperlipidemia, unspecified: Secondary | ICD-10-CM

## 2014-06-24 DIAGNOSIS — I2510 Atherosclerotic heart disease of native coronary artery without angina pectoris: Secondary | ICD-10-CM

## 2014-06-24 NOTE — Patient Instructions (Signed)
You are doing well. No medication changes were made.  Please call us if you have new issues that need to be addressed before your next appt.  Your physician wants you to follow-up in: 6 months.  You will receive a reminder letter in the mail two months in advance. If you don't receive a letter, please call our office to schedule the follow-up appointment.   

## 2014-06-24 NOTE — Assessment & Plan Note (Signed)
He reports this is stable, on antivirals

## 2014-06-24 NOTE — Assessment & Plan Note (Signed)
Stable, no further testing needed

## 2014-06-24 NOTE — Assessment & Plan Note (Signed)
Cholesterol is at goal on the current lipid regimen. No changes to the medications were made.  

## 2014-06-24 NOTE — Progress Notes (Signed)
Patient ID: [name] Kim, male    DOB: [birth year], 64 y.o.   MRN: [id number]  HPI Comments: [name] is a pleasant 64 year old gentleman with a history of coronary artery disease, bypass surgery in 2007, chronic HIV infection on antibiotic therapy, Long history of smoking, mild COPD Prior stress test in 2012 showing no ischemia who presents for routine follow-up of his coronary artery disease  In follow-up today, he reports that he is doing well. Denies any shortness of breath or chest pain. Weight is unchanged, tolerating all of his medications. In general is active, less so over the winter. typically goes to [place] in the winter, not this year  EKG on today's visit shows normal sinus rhythm with rate 52 bpm, no significant ST or T-wave changes  Other past medical history  He does report having a stress test and echocardiogram while in [place]. We have requested these records for our system Previously reported having very vivid dreams, punching and kicking sometimes at nighttime.      Allergies  Allergen Reactions  . Azt [Zidovudine] Other (See Comments)    Caused bone marrow problems  2000    Outpatient Encounter Prescriptions as of 06/24/2014  Medication Sig  . aspirin 81 MG tablet Take 81 mg by mouth daily.  [name] atorvastatin (LIPITOR) 20 MG tablet TAKE 1 TABLET BY MOUTH EVERY DAY  . emtricitabine-tenofovir (TRUVADA) 200-300 MG per tablet Take 1 tablet by mouth daily.  [name] lamoTRIgine (LAMICTAL) 100 MG tablet Take 100 mg by mouth 2 (two) times daily.  [name] omega-3 acid ethyl esters (LOVAZA) 1 G capsule TAKE 1 CAPSULE BY MOUTH TWICE DAILY  . raltegravir (ISENTRESS) 400 MG tablet Take 1 tablet (400 mg total) by mouth 2 (two) times daily.  . [DISCONTINUED] ISENTRESS 400 MG tablet TAKE 1 TABLET BY MOUTH TWICE DAILY (Patient not taking: Reported on 06/24/2014)    Past Medical History  Diagnosis Date  . HIV infection   . Hyperlipidemia   . Seizures   . CAD (coronary artery disease)    . Chronic kidney disease     Past Surgical History  Procedure Laterality Date  . Coronary artery bypass graft  08-09-2005    LIMA-LAD, SVG-diagonal, SVG-OM  . Appendectomy  1970  . Tonsilectomy, adenoidectomy, bilateral myringotomy and tubes  [phone]    Social History  reports that he quit smoking about 10 years ago. His smoking use included Cigarettes. He has never used smokeless tobacco. He reports that he drinks alcohol. He reports that he does not use illicit drugs.  Family History family history includes Cancer in his mother.  Review of Systems  Constitutional: Negative.   Respiratory: Negative.        Shortness of breath with heavy exertion  Cardiovascular: Negative.   Gastrointestinal: Negative.   Musculoskeletal: Negative.   Neurological: Negative.   Hematological: Negative.   Psychiatric/Behavioral: Negative.   All other systems reviewed and are negative.    BP 122/60 mmHg  Pulse 52  Ht 5\' 6"  (1.676 m)  Wt 140 lb 12 oz (63.844 kg)  BMI 22.73 kg/m2  Physical Exam  Constitutional: He is oriented to person, place, and time. He appears well-developed and well-nourished.  HENT:  Head: Normocephalic.  Nose: Nose normal.  Mouth/Throat: Oropharynx is clear and moist.  Eyes: Conjunctivae are normal. Pupils are equal, round, and reactive to light.  Neck: Normal range of motion. Neck supple. No JVD present.  Cardiovascular: Normal rate, regular rhythm, S1 normal, S2 normal, normal heart  sounds and intact distal pulses.  Exam reveals no gallop and no friction rub.   No murmur heard. Pulmonary/Chest: Effort normal and breath sounds normal. No respiratory distress. He has no wheezes. He has no rales. He exhibits no tenderness.  Abdominal: Soft. Bowel sounds are normal. He exhibits no distension. There is no tenderness.  Musculoskeletal: Normal range of motion. He exhibits no edema or tenderness.  Lymphadenopathy:    He has no cervical adenopathy.  Neurological: He is  alert and oriented to person, place, and time. Coordination normal.  Skin: Skin is warm and dry. No rash noted. No erythema.  Psychiatric: He has a normal mood and affect. His behavior is normal. Judgment and thought content normal.      Assessment and Plan   Nursing note and vitals reviewed.

## 2014-06-24 NOTE — Assessment & Plan Note (Signed)
Currently with no symptoms of angina. No further workup at this time. Continue current medication regimen. 

## 2014-08-20 ENCOUNTER — Other Ambulatory Visit: Payer: Self-pay | Admitting: Cardiovascular Disease

## 2014-09-09 ENCOUNTER — Encounter: Payer: Self-pay | Admitting: Neurology

## 2014-09-09 ENCOUNTER — Ambulatory Visit (INDEPENDENT_AMBULATORY_CARE_PROVIDER_SITE_OTHER): Payer: Medicare HMO | Admitting: Neurology

## 2014-09-09 VITALS — BP 142/85 | HR 59 | Ht 67.0 in | Wt 138.0 lb

## 2014-09-09 DIAGNOSIS — B20 Human immunodeficiency virus [HIV] disease: Secondary | ICD-10-CM | POA: Diagnosis not present

## 2014-09-09 DIAGNOSIS — G4752 REM sleep behavior disorder: Secondary | ICD-10-CM | POA: Diagnosis not present

## 2014-09-09 DIAGNOSIS — G40909 Epilepsy, unspecified, not intractable, without status epilepticus: Secondary | ICD-10-CM

## 2014-09-09 DIAGNOSIS — G4750 Parasomnia, unspecified: Secondary | ICD-10-CM | POA: Insufficient documentation

## 2014-09-09 HISTORY — DX: REM sleep behavior disorder: G47.52

## 2014-09-09 NOTE — Patient Instructions (Signed)

## 2014-09-09 NOTE — Progress Notes (Signed)
Reason for visit: Seizures, sleep disorder  Referring physician: Dr. Mack Hook is a 64 y.o. male  History of present illness:  [name] is a 64 year old right-handed white male with a history of an HIV infection. The patient has a history of a right occipital lesion that initially was felt to be related to PML infection, and he underwent a brain biopsy. 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 The patient had a total of 5 seizures associated with left arm numbness and jerking, occasional loss of consciousness. The last seizure was in 2003. The patient has been on Depakote, Trileptal, and now he is on Lamictal. He is tolerating the medication well, without any recurring seizures. The patient has a left homonymous visual field deficit, so he does not operate a motor vehicle. He began having some difficulty with sleep behaviors approximately one year ago. This is around the time he was placed on Truvada. The patient will kick and punch at night, he has fallen out of bed at least on one occasion. He will talk in his sleep. The patient himself does not remember these issues, but he does correlate the events with dreams. The patient feels rested in the morning, not fatigued. The episodes do not occur every night, but are quite frequent in nature. He is sent to this office for further evaluation.  Past Medical History  Diagnosis Date  . HIV infection   . Hyperlipidemia   . CAD (coronary artery disease)   . Chronic kidney disease   . Seizures     last one Oct 2003  . 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 Coronary artery bypass graft  08-09-2005    LIMA-LAD, SVG-diagonal, SVG-OM  . Appendectomy  1970  . Tonsilectomy, adenoidectomy, bilateral myringotomy and tubes  08-1964    Family History  Problem Relation Age of  Onset  . Cancer Mother   . Cancer Father     leukemia    Social history:  reports that he quit smoking about 10 years ago. His smoking use included Cigarettes. He has never used smokeless tobacco. He reports that he drinks alcohol. He reports that he does not use illicit drugs.  Medications:  Prior to Admission medications   Medication Sig Start Date End Date Taking? Authorizing Provider  aspirin 81 MG tablet Take 81 mg by mouth daily.   Yes Historical Provider, MD  atorvastatin (LIPITOR) 20 MG tablet TAKE 1 TABLET BY MOUTH EVERY DAY 02/18/14  Yes Minna Merritts, MD  emtricitabine-tenofovir (TRUVADA) 200-300 MG per tablet Take 1 tablet by mouth daily. 03/31/14  Yes Michel Bickers, MD  lamoTRIgine (LAMICTAL) 100 MG tablet Take 100 mg by mouth 2 (two) times daily.   Yes Historical Provider, MD  omega-3 acid ethyl esters (LOVAZA) 1 G capsule TAKE 1 CAPSULE BY MOUTH TWICE DAILY 08/20/14  Yes Minna Merritts, MD  raltegravir (ISENTRESS) 400 MG tablet Take 1 tablet (400 mg total) by mouth 2 (two) times daily. 03/31/14  Yes Michel Bickers, MD      Allergies  Allergen Reactions  . Azt [Zidovudine] Other (See Comments)    Caused bone marrow problems  2000    ROS:  Out of a complete 14 system review of symptoms, the patient complains only of the following symptoms, and all other reviewed systems  are negative.  Seizures Sleep behavior  Blood pressure 142/85, pulse 59, height '5\' 7"'$  (1.702 m), weight 138 lb (62.596 kg).  Physical Exam  General: The patient is alert and cooperative at the time of the examination.  Eyes: Pupils are equal, round, and reactive to light. Discs are flat bilaterally.  Neck: The neck is supple, no carotid bruits are noted.  Respiratory: The respiratory examination is clear.  Cardiovascular: The cardiovascular examination reveals a regular rate and rhythm, no obvious murmurs or rubs are noted.  Skin: Extremities are without significant edema.  Neurologic  Exam  Mental status: The patient is alert and oriented x 3 at the time of the examination. The patient has apparent normal recent and remote memory, with an apparently normal attention span and concentration ability.  Cranial nerves: Facial symmetry is present. There is good sensation of the face to pinprick and soft touch bilaterally. The strength of the facial muscles and the muscles to head turning and shoulder shrug are normal bilaterally. Speech is well enunciated, no aphasia or dysarthria is noted. Extraocular movements are full. Visual fields are full on the right, with a dense left homonymous visual field deficit. The tongue is midline, and the patient has symmetric elevation of the soft palate. No obvious hearing deficits are noted.  Motor: The motor testing reveals 5 over 5 strength of all 4 extremities. Good symmetric motor tone is noted throughout.  Sensory: Sensory testing is intact to pinprick, soft touch, vibration sensation, and position sense on all 4 extremities. No evidence of extinction is noted.  Coordination: Cerebellar testing reveals good finger-nose-finger and heel-to-shin bilaterally.  Gait and station: Gait is normal. Tandem gait is normal. Romberg is negative. No drift is seen.  Reflexes: Deep tendon reflexes are symmetric and normal bilaterally. Toes are downgoing bilaterally.   CT head 11/10/07:  has undergone MRI evaluation of the lumbar spine showing evidence of nerve root impingement the L4, L5, and S1 nerve roots.    Assessment/Plan:  1. HIV infection  2. Right occipital encephalomalacia  3. 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 REM sleep behavior disorder  The patient indicates that the Truvada was started around the same time that the behavior disorder occurred. The patient will be taken off of this medication in the next month. He is to contact me if the REM sleep behavior disorder continues to be a problem, if so, medication such as clonazepam can be added  in the evening to suppress the behavior. Otherwise, he will follow-up through this office on an as-needed basis. The seizures are under excellent control. He is not to operate a motor vehicle given the left homonymous visual field deficit.  Jill Alexanders MD 09/09/2014 7:20 PM  [facility] [address] [place], [place] [address]  Phone [phone] Fax [phone]

## 2014-09-29 ENCOUNTER — Other Ambulatory Visit: Payer: Medicare HMO

## 2014-09-29 DIAGNOSIS — B20 Human immunodeficiency virus [HIV] disease: Secondary | ICD-10-CM

## 2014-09-29 DIAGNOSIS — Z79899 Other long term (current) drug therapy: Secondary | ICD-10-CM

## 2014-09-29 DIAGNOSIS — Z113 Encounter for screening for infections with a predominantly sexual mode of transmission: Secondary | ICD-10-CM

## 2014-09-29 LAB — CBC
HCT: 49.9 % (ref 39.0–52.0)
HEMOGLOBIN: 17 g/dL (ref 13.0–17.0)
MCH: 33.7 pg (ref 26.0–34.0)
MCHC: 34.1 g/dL (ref 30.0–36.0)
MCV: 98.8 fL (ref 78.0–100.0)
MPV: 9.9 fL (ref 8.6–12.4)
PLATELETS: 220 10*3/uL (ref 150–400)
RBC: 5.05 MIL/uL (ref 4.22–5.81)
RDW: 13.6 % (ref 11.5–15.5)
WBC: 9.1 10*3/uL (ref 4.0–10.5)

## 2014-09-29 LAB — COMPREHENSIVE METABOLIC PANEL
ALK PHOS: 89 U/L (ref 39–117)
ALT: 14 U/L (ref 0–53)
AST: 14 U/L (ref 0–37)
Albumin: 4.5 g/dL (ref 3.5–5.2)
BUN: 10 mg/dL (ref 6–23)
CHLORIDE: 101 meq/L (ref 96–112)
CO2: 26 mEq/L (ref 19–32)
Calcium: 9.8 mg/dL (ref 8.4–10.5)
Creat: 1.27 mg/dL (ref 0.50–1.35)
Glucose, Bld: 95 mg/dL (ref 70–99)
Potassium: 4.4 mEq/L (ref 3.5–5.3)
SODIUM: 139 meq/L (ref 135–145)
TOTAL PROTEIN: 7.2 g/dL (ref 6.0–8.3)
Total Bilirubin: 0.8 mg/dL (ref 0.2–1.2)

## 2014-09-29 LAB — LIPID PANEL
CHOL/HDL RATIO: 2.9 ratio
Cholesterol: 119 mg/dL (ref 0–200)
HDL: 41 mg/dL (ref >=40)
LDL Cholesterol: 54 mg/dL (ref 0–99)
Triglycerides: 119 mg/dL (ref <150)
VLDL: 24 mg/dL (ref 0–40)

## 2014-09-29 LAB — RPR

## 2014-09-30 LAB — T-HELPER CELL (CD4) - (RCID CLINIC ONLY)
CD4 T CELL HELPER: 25 % — AB (ref 33–55)
CD4 T Cell Abs: 600 /uL (ref 400–2700)

## 2014-10-01 LAB — HIV-1 RNA QUANT-NO REFLEX-BLD
HIV 1 RNA Quant: <20 copies/mL (ref <20)
HIV-1 RNA Quant, Log: <1.3 {Log} (ref <1.30)

## 2014-10-10 ENCOUNTER — Other Ambulatory Visit: Payer: Self-pay | Admitting: Internal Medicine

## 2014-10-13 ENCOUNTER — Ambulatory Visit (INDEPENDENT_AMBULATORY_CARE_PROVIDER_SITE_OTHER): Payer: Medicare HMO | Admitting: Internal Medicine

## 2014-10-13 ENCOUNTER — Encounter: Payer: Self-pay | Admitting: Internal Medicine

## 2014-10-13 DIAGNOSIS — B20 Human immunodeficiency virus [HIV] disease: Secondary | ICD-10-CM | POA: Diagnosis not present

## 2014-10-13 NOTE — Progress Notes (Signed)
Patient ID: [name] Kim, male   DOB: 08/29/1950, 64 y.o.   MRN: [id number]          Patient Active Problem List   Diagnosis Date Noted  . Human immunodeficiency virus (HIV) disease 01/23/2012    Priority: High  . 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 Acute renal insufficiency 10/31/2012  . Other malaise and fatigue 10/29/2012  . Weight loss, unintentional 10/29/2012  . Dyslipidemia 02/08/2012  . Coronary artery disease 01/23/2012  . Seizure disorder 01/23/2012    Patient's Medications  New Prescriptions   No medications on file  Previous Medications   ASPIRIN 81 MG TABLET    Take 81 mg by mouth daily.   ATORVASTATIN (LIPITOR) 20 MG TABLET    TAKE 1 TABLET BY MOUTH EVERY DAY   EMTRICITABINE-TENOFOVIR (TRUVADA) 200-300 MG PER TABLET    Take 1 tablet by mouth daily.   LAMOTRIGINE (LAMICTAL) 100 MG TABLET    Take 100 mg by mouth 2 (two) times daily.   OMEGA-3 ACID ETHYL ESTERS (LOVAZA) 1 G CAPSULE    TAKE 1 CAPSULE BY MOUTH TWICE DAILY   RALTEGRAVIR (ISENTRESS) 400 MG TABLET    Take 1 tablet (400 mg total) by mouth 2 (two) times daily.  Modified Medications   No medications on file  Discontinued Medications   ISENTRESS 400 MG TABLET    TAKE 1 TABLET BY MOUTH TWICE DAILY    Subjective: [name] Kim is in for his routine visit. He denies missing any doses of his Truvada or Isentress. He is doing well without any current complaints. He continues to have intermittent problems with vivid dreams but has not had any recently and does not feel it is related to his anti-retroviral medications.  Review of Systems: Pertinent items are noted in HPI.  Past Medical History  Diagnosis Date  . HIV infection   . Hyperlipidemia   . CAD (coronary artery disease)   . Chronic kidney disease   . Seizures     last one Oct 2003  . 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  Smoking status: Former Smoker    Types: Cigarettes    Quit date: 05/22/2004  . Smokeless tobacco: Never Used  . Alcohol Use: 0.0 oz/week    0 Standard drinks or equivalent per week     Comment: about 2 per week    Family History  Problem Relation Age of Onset  . Cancer Mother   . Cancer Father     leukemia    Allergies  Allergen Reactions  . Azt [Zidovudine] Other (See Comments)    Caused bone marrow problems  2000    Objective: Temp: 97.6 F (36.4 C) (05/24 0840) Temp Source: Oral (05/24 0840) BP: 161/80 mmHg (05/24 0840) Pulse Rate: 50 (05/24 0840) Body mass index is 21.69 kg/(m^2).  General: he is in good spirits Oral: no oropharyngeal lesions Skin: no rash Lungs: clear Cor: regular S1 and S2 no murmurs  Lab Results Lab Results  Component Value Date   WBC 9.1 09/29/2014   HGB 17.0 09/29/2014   HCT 49.9 09/29/2014   MCV 98.8 09/29/2014   PLT 220 09/29/2014    Lab Results  Component Value Date   CREATININE 1.27 09/29/2014   BUN 10 09/29/2014   NA 139 09/29/2014   K 4.4 09/29/2014   CL 101 09/29/2014   CO2 26  09/29/2014    Lab Results  Component Value Date   ALT 14 09/29/2014   AST 14 09/29/2014   ALKPHOS 89 09/29/2014   BILITOT 0.8 09/29/2014    Lab Results  Component Value Date   CHOL 119 09/29/2014   HDL 41 09/29/2014   LDLCALC 54 09/29/2014   LDLDIRECT 126.1 02/19/2013   TRIG 119 09/29/2014   CHOLHDL 2.9 09/29/2014    Lab Results HIV 1 RNA QUANT (copies/mL)  Date Value  09/29/2014 <20  03/17/2014 <20  09/09/2013 <20   CD4 T CELL ABS (/uL)  Date Value  09/29/2014 600  03/17/2014 470  09/09/2013 460     Assessment: His HIV infection remains under perfect control. He prefers to continue his current antiretroviral regimen.  Plan: 1. Continue Truvada and Isentress 2. Follow-up after blood work in [place] months   Michel Bickers, MD [facility] for [place] [phone] pager   [phone]  cell 10/13/2014, 9:06 AM

## 2014-11-17 ENCOUNTER — Other Ambulatory Visit: Payer: Self-pay | Admitting: Cardiovascular Disease

## 2014-12-22 ENCOUNTER — Ambulatory Visit (INDEPENDENT_AMBULATORY_CARE_PROVIDER_SITE_OTHER): Payer: Medicare HMO | Admitting: Cardiovascular Disease

## 2014-12-22 ENCOUNTER — Encounter: Payer: Self-pay | Admitting: Cardiovascular Disease

## 2014-12-22 VITALS — BP 134/74 | HR 64 | Ht 67.0 in | Wt 136.0 lb

## 2014-12-22 DIAGNOSIS — I2510 Atherosclerotic heart disease of native coronary artery without angina pectoris: Secondary | ICD-10-CM

## 2014-12-22 DIAGNOSIS — E785 Hyperlipidemia, unspecified: Secondary | ICD-10-CM

## 2014-12-22 DIAGNOSIS — Z951 Presence of aortocoronary bypass graft: Secondary | ICD-10-CM | POA: Diagnosis not present

## 2014-12-22 NOTE — Assessment & Plan Note (Signed)
Currently with no symptoms of angina. No further workup at this time. Continue current medication regimen. 

## 2014-12-22 NOTE — Patient Instructions (Signed)
You are doing well. No medication changes were made.  Please call us if you have new issues that need to be addressed before your next appt.  Your physician wants you to follow-up in: 6 months.  You will receive a reminder letter in the mail two months in advance. If you don't receive a letter, please call our office to schedule the follow-up appointment.   

## 2014-12-22 NOTE — Assessment & Plan Note (Signed)
No new symptoms concerning for angina. No further testing ordered at this time

## 2014-12-22 NOTE — Assessment & Plan Note (Signed)
Cholesterol is at goal on the current lipid regimen. No changes to the medications were made.  

## 2014-12-22 NOTE — Progress Notes (Signed)
Patient ID: [name] Kim, male    DOB: [birth year], 64 y.o.   MRN: [id number]  HPI Comments: [name] Kim is a pleasant 64 year old gentleman with a history of coronary artery disease, bypass surgery in 2007, chronic HIV infection on antibiotic therapy, Long history of smoking, mild COPD Prior stress test in 2012 showing no ischemia who presents for routine follow-up of his coronary artery disease  In follow-up today, he reports that he is doing well.  Denies any shortness of breath or chest pain.  tolerating all of his medications. No new symptoms concerning for angina  EKG on today's visit shows normal sinus rhythm with rate 64 bpm, no significant ST or T-wave changes Lab work reviewed with him  Other past medical history  He does report having a stress test and echocardiogram while in [place]. We have requested these records for our system Previously reported having very vivid dreams, punching and kicking sometimes at nighttime.      Allergies  Allergen Reactions  . Azt [Zidovudine] Other (See Comments)    Caused bone marrow problems  2000    Outpatient Encounter Prescriptions as of 12/22/2014  Medication Sig  . aspirin 81 MG tablet Take 81 mg by mouth daily.  [name] atorvastatin (LIPITOR) 20 MG tablet TAKE 1 TABLET BY MOUTH EVERY DAY  . emtricitabine-tenofovir (TRUVADA) 200-300 MG per tablet Take 1 tablet by mouth daily.  . fluticasone (FLONASE) 50 MCG/ACT nasal spray Place 1 spray into both nostrils daily.   [name] lamoTRIgine (LAMICTAL) 100 MG tablet Take 100 mg by mouth 2 (two) times daily.  [name] omega-3 acid ethyl esters (LOVAZA) 1 G capsule TAKE 1 CAPSULE BY MOUTH TWICE DAILY  . raltegravir (ISENTRESS) 400 MG tablet Take 1 tablet (400 mg total) by mouth 2 (two) times daily.   No facility-administered encounter medications on file as of 12/22/2014.    Past Medical History  Diagnosis Date  . HIV infection   . Hyperlipidemia   . CAD (coronary artery disease)   . Chronic kidney  disease   . Seizures     last one Oct 2003  . 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 Coronary artery bypass graft  08-09-2005    LIMA-LAD, SVG-diagonal, SVG-OM  . Appendectomy  1970  . Tonsilectomy, adenoidectomy, bilateral myringotomy and tubes  [phone]    Social History  reports that he quit smoking about 10 years ago. His smoking use included Cigarettes. He has never used smokeless tobacco. He reports that he does not drink alcohol or use illicit drugs.  Family History family history includes Cancer in his father and mother.  Review of Systems  Constitutional: Negative.   Respiratory: Negative.        Shortness of breath with heavy exertion  Cardiovascular: Negative.   Gastrointestinal: Negative.   Musculoskeletal: Negative.   Neurological: Negative.   Hematological: Negative.   Psychiatric/Behavioral: Negative.   All other systems reviewed and are negative.    BP 134/74 mmHg  Pulse 64  Ht '5\' 7"'$  (1.702 m)  Wt 136 lb (61.689 kg)  BMI 21.30 kg/m2  Physical Exam  Constitutional: He is oriented to person, place, and time. He appears well-developed and well-nourished.  HENT:  Head: Normocephalic.  Nose: Nose normal.  Mouth/Throat: Oropharynx is clear and moist.  Eyes: Conjunctivae are normal. Pupils are equal, round, and reactive to light.  Neck: Normal range of motion. Neck supple. No JVD present.  Cardiovascular: Normal  rate, regular rhythm, S1 normal, S2 normal, normal heart sounds and intact distal pulses.  Exam reveals no gallop and no friction rub.   No murmur heard. Pulmonary/Chest: Effort normal and breath sounds normal. No respiratory distress. He has no wheezes. He has no rales. He exhibits no tenderness.  Abdominal: Soft. Bowel sounds are normal. He exhibits no distension. There is no tenderness.  Musculoskeletal: Normal range of motion. He exhibits no edema or tenderness.  Lymphadenopathy:    He has  no cervical adenopathy.  Neurological: He is alert and oriented to person, place, and time. Coordination normal.  Skin: Skin is warm and dry. No rash noted. No erythema.  Psychiatric: He has a normal mood and affect. His behavior is normal. Judgment and thought content normal.      Assessment and Plan   Nursing note and vitals reviewed.

## 2015-02-11 ENCOUNTER — Other Ambulatory Visit: Payer: Self-pay | Admitting: Cardiovascular Disease

## 2015-03-01 DIAGNOSIS — K635 Polyp of colon: Secondary | ICD-10-CM | POA: Insufficient documentation

## 2015-03-01 DIAGNOSIS — J301 Allergic rhinitis due to pollen: Secondary | ICD-10-CM | POA: Insufficient documentation

## 2015-03-01 DIAGNOSIS — H53453 Other localized visual field defect, bilateral: Secondary | ICD-10-CM | POA: Insufficient documentation

## 2015-03-30 ENCOUNTER — Other Ambulatory Visit: Payer: Self-pay | Admitting: Internal Medicine

## 2015-03-30 DIAGNOSIS — B20 Human immunodeficiency virus [HIV] disease: Secondary | ICD-10-CM

## 2015-03-31 MED ORDER — RALTEGRAVIR POTASSIUM 400 MG PO TABS: 400.0000 mg | ORAL_TABLET | Freq: Two times a day (BID) | ORAL | Status: DC

## 2015-04-05 ENCOUNTER — Other Ambulatory Visit: Payer: Medicare HMO

## 2015-04-05 DIAGNOSIS — B20 Human immunodeficiency virus [HIV] disease: Secondary | ICD-10-CM

## 2015-04-05 LAB — COMPREHENSIVE METABOLIC PANEL
ALK PHOS: 96 U/L (ref 40–115)
ALT: 16 U/L (ref 9–46)
AST: 18 U/L (ref 10–35)
Albumin: 4.5 g/dL (ref 3.6–5.1)
BUN: 13 mg/dL (ref 7–25)
CALCIUM: 9.9 mg/dL (ref 8.6–10.3)
CHLORIDE: 101 mmol/L (ref 98–110)
CO2: 28 mmol/L (ref 20–31)
Creat: 1.27 mg/dL — ABNORMAL HIGH (ref 0.70–1.25)
GLUCOSE: 101 mg/dL — AB (ref 65–99)
POTASSIUM: 5 mmol/L (ref 3.5–5.3)
Sodium: 139 mmol/L (ref 135–146)
Total Bilirubin: 1.1 mg/dL (ref 0.2–1.2)
Total Protein: 7 g/dL (ref 6.1–8.1)

## 2015-04-05 LAB — CBC
HCT: 47.4 % (ref 39.0–52.0)
Hemoglobin: 16.4 g/dL (ref 13.0–17.0)
MCH: 34.6 pg — ABNORMAL HIGH (ref 26.0–34.0)
MCHC: 34.6 g/dL (ref 30.0–36.0)
MCV: 100 fL (ref 78.0–100.0)
MPV: 10 fL (ref 8.6–12.4)
PLATELETS: 228 10*3/uL (ref 150–400)
RBC: 4.74 MIL/uL (ref 4.22–5.81)
RDW: 13.1 % (ref 11.5–15.5)
WBC: 9.2 10*3/uL (ref 4.0–10.5)

## 2015-04-06 LAB — T-HELPER CELL (CD4) - (RCID CLINIC ONLY)
CD4 % Helper T Cell: 23 % — ABNORMAL LOW (ref 33–55)
CD4 T Cell Abs: 600 /uL (ref 400–2700)

## 2015-04-07 LAB — HIV-1 RNA QUANT-NO REFLEX-BLD

## 2015-04-20 ENCOUNTER — Encounter: Payer: Self-pay | Admitting: Internal Medicine

## 2015-04-20 ENCOUNTER — Ambulatory Visit (INDEPENDENT_AMBULATORY_CARE_PROVIDER_SITE_OTHER): Payer: Medicare HMO | Admitting: Internal Medicine

## 2015-04-20 DIAGNOSIS — B20 Human immunodeficiency virus [HIV] disease: Secondary | ICD-10-CM | POA: Diagnosis not present

## 2015-04-20 MED ORDER — ELVITEG-COBIC-EMTRICIT-TENOFAF 150-150-200-10 MG PO TABS: 1.0000 | ORAL_TABLET | Freq: Every day | ORAL | Status: DC

## 2015-04-20 NOTE — Progress Notes (Signed)
Patient Active Problem List   Diagnosis Date Noted  . Human immunodeficiency virus (HIV) disease ([place]) 01/23/2012    Priority: High  . 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 Acute renal insufficiency 10/31/2012  . Other malaise and fatigue 10/29/2012  . Weight loss, unintentional 10/29/2012  . Dyslipidemia 02/08/2012  . Coronary artery disease 01/23/2012  . Seizure disorder ([place]) 01/23/2012    Patient's Medications  New Prescriptions   ELVITEGRAVIR-COBICISTAT-EMTRICITABINE-TENOFOVIR (GENVOYA) 150-150-200-10 MG TABS TABLET    Take 1 tablet by mouth daily with breakfast.  Previous Medications   ASPIRIN 81 MG TABLET    Take 81 mg by mouth daily.   ATORVASTATIN (LIPITOR) 20 MG TABLET    TAKE 1 TABLET BY MOUTH EVERY DAY   FLUTICASONE (FLONASE) 50 MCG/ACT NASAL SPRAY    Place 1 spray into both nostrils daily.    LAMOTRIGINE (LAMICTAL) 100 MG TABLET    Take 100 mg by mouth 2 (two) times daily.   OMEGA-3 ACID ETHYL ESTERS (LOVAZA) 1 G CAPSULE    TAKE 1 CAPSULE BY MOUTH TWICE DAILY  Modified Medications   No medications on file  Discontinued Medications   RALTEGRAVIR (ISENTRESS) 400 MG TABLET    Take 1 tablet (400 mg total) by mouth 2 (two) times daily.   TRUVADA 200-300 MG TABLET    TAKE 1 TABLET BY MOUTH EVERY DAY    Subjective: [name] Kim is in for his routine HIV follow-up visit. He denies missing any doses of his Truvada or Isentress since his last visit. He is doing well.   Review of Systems: Review of Systems  Constitutional: Negative for fever, chills, weight loss, malaise/fatigue and diaphoresis.  HENT: Negative for sore throat.   Respiratory: Negative for cough, sputum production and shortness of breath.   Cardiovascular: Negative for chest pain.  Gastrointestinal: Negative for nausea, vomiting and diarrhea.  Genitourinary: Negative for dysuria and frequency.  Musculoskeletal:  Negative for myalgias and joint pain.  Skin: Negative for rash.  Neurological: Negative for focal weakness.  Psychiatric/Behavioral: Negative for depression and substance abuse. The patient is not nervous/anxious.     Past Medical History  Diagnosis Date  . HIV infection ([place])   . Hyperlipidemia   . CAD (coronary artery disease)   . Chronic kidney disease   . Seizures ([place])     last one Oct 2003  . 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 Smoking status: Former Smoker    Types: Cigarettes    Quit date: 05/22/2004  . Smokeless tobacco: Never Used  . Alcohol Use: No     Comment: about 2 per week    Family History  Problem Relation Age of Onset  . Cancer Mother   . Cancer Father     leukemia    Allergies  Allergen Reactions  . Azt [Zidovudine] Other (See Comments)    Caused bone marrow problems  2000    Objective:  Filed Vitals:   04/20/15 0844  BP: 150/78  Pulse: 65  Temp: 97.9 F (36.6 C)  Weight: 138 lb 12.8 oz (62.959 kg)   Body mass index is 21.73 kg/(m^2).  Physical Exam  Constitutional: He is oriented to person, place, and time.  Eyes: Conjunctivae are normal.  Cardiovascular: Normal rate and regular rhythm.   No murmur heard. Pulmonary/Chest: Breath sounds normal.  Abdominal:  Soft. He exhibits no mass. There is no tenderness.  Musculoskeletal: Normal range of motion.  Neurological: He is alert and oriented to person, place, and time.  Skin: No rash noted.  Psychiatric: Mood and affect normal.    Lab Results Lab Results  Component Value Date   WBC 9.2 04/05/2015   HGB 16.4 04/05/2015   HCT 47.4 04/05/2015   MCV 100.0 04/05/2015   PLT 228 04/05/2015    Lab Results  Component Value Date   CREATININE 1.27* 04/05/2015   BUN 13 04/05/2015   NA 139 04/05/2015   K 5.0 04/05/2015   CL 101 04/05/2015   CO2 28 04/05/2015    Lab Results  Component Value Date   ALT 16 04/05/2015   AST 18 04/05/2015     ALKPHOS 96 04/05/2015   BILITOT 1.1 04/05/2015    Lab Results  Component Value Date   CHOL 119 09/29/2014   HDL 41 09/29/2014   LDLCALC 54 09/29/2014   LDLDIRECT 126.1 02/19/2013   TRIG 119 09/29/2014   CHOLHDL 2.9 09/29/2014    Lab Results HIV 1 RNA QUANT (copies/mL)  Date Value  04/05/2015 <20  09/29/2014 <20  03/17/2014 <20   CD4 T CELL ABS (/uL)  Date Value  04/05/2015 600  09/29/2014 600  03/17/2014 470      Problem List Items Addressed This Visit      High   Human immunodeficiency virus (HIV) disease ([place]) (Chronic)    His HIV infection remains under excellent control. He has some mild renal insufficiency. I discussed newer, safer antiretroviral regimens. He agrees to change to [facility] once daily with his evening meal. He will follow up after lab work in 6 months.      Relevant Medications   elvitegravir-cobicistat-emtricitabine-tenofovir (GENVOYA) 150-150-200-10 MG TABS tablet   Other Relevant Orders   T-helper cell (CD4)- ([facility] clinic only)   HIV 1 RNA quant-no reflex-bld   CBC   Comprehensive metabolic panel   Lipid panel   RPR        Michel Bickers, MD [facility] for [facility] [place] [phone] pager   [phone] cell 04/20/2015, 9:10 AM

## 2015-04-20 NOTE — Assessment & Plan Note (Signed)
His HIV infection remains under excellent control. He has some mild renal insufficiency. I discussed newer, safer antiretroviral regimens. He agrees to change to [facility] once daily with his evening meal. He will follow up after lab work in 6 months.

## 2015-04-21 ENCOUNTER — Telehealth: Payer: Self-pay | Admitting: *Deleted

## 2015-04-21 NOTE — Telephone Encounter (Addendum)
PA needed for new HIV rx - Genvoya.  PA completed and sent to plan for approval, may take 48 to 72 hours.  PA Case: [id number].  PA approved through 07/20/2015.

## 2015-05-03 NOTE — Telephone Encounter (Signed)
[facility] stated that pt picked up rx 04/22/15.

## 2015-06-21 ENCOUNTER — Encounter: Payer: Self-pay | Admitting: Cardiovascular Disease

## 2015-06-21 ENCOUNTER — Ambulatory Visit (INDEPENDENT_AMBULATORY_CARE_PROVIDER_SITE_OTHER): Payer: Medicare HMO | Admitting: Cardiovascular Disease

## 2015-06-21 VITALS — BP 124/62 | HR 58 | Ht 66.5 in | Wt 138.5 lb

## 2015-06-21 DIAGNOSIS — B20 Human immunodeficiency virus [HIV] disease: Secondary | ICD-10-CM

## 2015-06-21 DIAGNOSIS — I2510 Atherosclerotic heart disease of native coronary artery without angina pectoris: Secondary | ICD-10-CM

## 2015-06-21 DIAGNOSIS — Z951 Presence of aortocoronary bypass graft: Secondary | ICD-10-CM | POA: Diagnosis not present

## 2015-06-21 DIAGNOSIS — E785 Hyperlipidemia, unspecified: Secondary | ICD-10-CM

## 2015-06-21 NOTE — Assessment & Plan Note (Signed)
On a triple combination pill Reports everything is stable

## 2015-06-21 NOTE — Assessment & Plan Note (Signed)
Currently with no symptoms of angina. No further workup at this time. Continue current medication regimen. 

## 2015-06-21 NOTE — Patient Instructions (Signed)
You are doing well. No medication changes were made.  Please call us if you have new issues that need to be addressed before your next appt.  Your physician wants you to follow-up in: 6 months.  You will receive a reminder letter in the mail two months in advance. If you don't receive a letter, please call our office to schedule the follow-up appointment.   

## 2015-06-21 NOTE — Progress Notes (Signed)
Patient ID: [name] Kim, male    DOB: 04/20/1951, 65 y.o.   MRN: [id number]  HPI Comments: Mr. Lipkin is a pleasant 65 year old gentleman with a history of coronary artery disease, bypass surgery in 2007, chronic HIV infection on antibiotic therapy, Long history of smoking, mild COPD Prior stress test in 2012 showing no ischemia who presents for routine follow-up of his coronary artery disease  In follow-up, he reports that he is doing well, denies any symptoms concerning for angina Exercises periodically, [place], can walk the 2.5 mile loop Periodically does weights Does have some shortness of breath when he climbs up a hill Does not use inhalers Cold fingers consistent with Raynaud's Recent changes to his HIV medications, triple combination pill. Some side effects including GI  EKG on today's visit shows normal sinus rhythm with rate 58 bpm, no significant ST or T-wave changes   Other past medical history  He does report having a stress test and echocardiogram while in [place].  Previously reported having very vivid dreams, punching and kicking sometimes at nighttime.      Allergies  Allergen Reactions  . Azt [Zidovudine] Other (See Comments)    Caused bone marrow problems  2000    Outpatient Encounter Prescriptions as of 06/21/2015  Medication Sig  . aspirin 81 MG tablet Take 81 mg by mouth daily.  [name] atorvastatin (LIPITOR) 20 MG tablet TAKE 1 TABLET BY MOUTH EVERY DAY  . elvitegravir-cobicistat-emtricitabine-tenofovir (GENVOYA) 150-150-200-10 MG TABS tablet Take 1 tablet by mouth daily with breakfast.  . fluticasone (FLONASE) 50 MCG/ACT nasal spray Place 1 spray into both nostrils daily.   [name] lamoTRIgine (LAMICTAL) 100 MG tablet Take 100 mg by mouth 2 (two) times daily.  [name] omega-3 acid ethyl esters (LOVAZA) 1 G capsule TAKE 1 CAPSULE BY MOUTH TWICE DAILY   No facility-administered encounter medications on file as of 06/21/2015.    Past Medical History   Diagnosis Date  . HIV infection ([place])   . Hyperlipidemia   . CAD (coronary artery disease)   . Chronic kidney disease   . Seizures ([place])     last one Oct 2003  . 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 Coronary artery bypass graft  08-09-2005    LIMA-LAD, SVG-diagonal, SVG-OM  . Appendectomy  1970  . Tonsilectomy, adenoidectomy, bilateral myringotomy and tubes  [phone]    Social History  reports that he quit smoking about 11 years ago. His smoking use included Cigarettes. He has never used smokeless tobacco. He reports that he does not drink alcohol or use illicit drugs.  Family History family history includes Cancer in his father and mother.  Review of Systems  Constitutional: Negative.   Respiratory: Negative.        Shortness of breath with heavy exertion  Cardiovascular: Negative.   Gastrointestinal: Negative.   Musculoskeletal: Negative.   Neurological: Negative.   Hematological: Negative.   Psychiatric/Behavioral: Negative.   All other systems reviewed and are negative.    BP 124/62 mmHg  Pulse 58  Ht 5' 6.5" (1.689 m)  Wt 138 lb 8 oz (62.823 kg)  BMI 22.02 kg/m2  Physical Exam  Constitutional: He is oriented to person, place, and time. He appears well-developed and well-nourished.  HENT:  Head: Normocephalic.  Nose: Nose normal.  Mouth/Throat: Oropharynx is clear and moist.  Eyes: Conjunctivae are normal. Pupils are equal, round, and reactive to light.  Neck: Normal  range of motion. Neck supple. No JVD present.  Cardiovascular: Normal rate, regular rhythm, S1 normal, S2 normal, normal heart sounds and intact distal pulses.  Exam reveals no gallop and no friction rub.   No murmur heard. Pulmonary/Chest: Effort normal and breath sounds normal. No respiratory distress. He has no wheezes. He has no rales. He exhibits no tenderness.  Abdominal: Soft. Bowel sounds are normal. He exhibits no distension. There  is no tenderness.  Musculoskeletal: Normal range of motion. He exhibits no edema or tenderness.  Lymphadenopathy:    He has no cervical adenopathy.  Neurological: He is alert and oriented to person, place, and time. Coordination normal.  Skin: Skin is warm and dry. No rash noted. No erythema.  Psychiatric: He has a normal mood and affect. His behavior is normal. Judgment and thought content normal.      Assessment and Plan   Nursing note and vitals reviewed.

## 2015-06-21 NOTE — Assessment & Plan Note (Signed)
Shortness of breath likely secondary to deconditioning, prior smoking history. Recommended he call us if symptoms get worse

## 2015-06-21 NOTE — Assessment & Plan Note (Signed)
Cholesterol is at goal on the current lipid regimen. No changes to the medications were made.  

## 2015-07-07 ENCOUNTER — Other Ambulatory Visit: Payer: Self-pay | Admitting: *Deleted

## 2015-07-07 DIAGNOSIS — B20 Human immunodeficiency virus [HIV] disease: Secondary | ICD-10-CM

## 2015-07-07 MED ORDER — ELVITEG-COBIC-EMTRICIT-TENOFAF 150-150-200-10 MG PO TABS: 1.0000 | ORAL_TABLET | Freq: Every day | ORAL | Status: DC

## 2015-10-05 ENCOUNTER — Other Ambulatory Visit: Payer: Medicare HMO

## 2015-10-05 DIAGNOSIS — Z79899 Other long term (current) drug therapy: Secondary | ICD-10-CM

## 2015-10-05 DIAGNOSIS — B20 Human immunodeficiency virus [HIV] disease: Secondary | ICD-10-CM

## 2015-10-05 DIAGNOSIS — Z113 Encounter for screening for infections with a predominantly sexual mode of transmission: Secondary | ICD-10-CM

## 2015-10-05 LAB — CBC
HEMATOCRIT: 48.5 % (ref 38.5–50.0)
Hemoglobin: 16.7 g/dL (ref 13.2–17.1)
MCH: 34.7 pg — ABNORMAL HIGH (ref 27.0–33.0)
MCHC: 34.4 g/dL (ref 32.0–36.0)
MCV: 100.8 fL — AB (ref 80.0–100.0)
MPV: 9.8 fL (ref 7.5–12.5)
PLATELETS: 253 10*3/uL (ref 140–400)
RBC: 4.81 MIL/uL (ref 4.20–5.80)
RDW: 13.5 % (ref 11.0–15.0)
WBC: 8.5 10*3/uL (ref 3.8–10.8)

## 2015-10-05 LAB — LIPID PANEL
CHOLESTEROL: 146 mg/dL (ref 125–200)
HDL: 42 mg/dL (ref >=40)
LDL CALC: 74 mg/dL (ref <130)
TRIGLYCERIDES: 151 mg/dL — AB (ref <150)
Total CHOL/HDL Ratio: 3.5 Ratio (ref <=5.0)
VLDL: 30 mg/dL (ref <30)

## 2015-10-05 LAB — COMPREHENSIVE METABOLIC PANEL
ALT: 8 U/L — ABNORMAL LOW (ref 9–46)
AST: 12 U/L (ref 10–35)
Albumin: 4.6 g/dL (ref 3.6–5.1)
Alkaline Phosphatase: 86 U/L (ref 40–115)
BUN: 16 mg/dL (ref 7–25)
CALCIUM: 10.1 mg/dL (ref 8.6–10.3)
CHLORIDE: 101 mmol/L (ref 98–110)
CO2: 30 mmol/L (ref 20–31)
CREATININE: 1.27 mg/dL — AB (ref 0.70–1.25)
Glucose, Bld: 101 mg/dL — ABNORMAL HIGH (ref 65–99)
POTASSIUM: 4.8 mmol/L (ref 3.5–5.3)
Sodium: 138 mmol/L (ref 135–146)
Total Bilirubin: 0.5 mg/dL (ref 0.2–1.2)
Total Protein: 7.5 g/dL (ref 6.1–8.1)

## 2015-10-06 LAB — RPR

## 2015-10-06 LAB — HIV-1 RNA QUANT-NO REFLEX-BLD: HIV-1 RNA Quant, Log: <1.3 Log copies/mL (ref <1.30)

## 2015-10-06 LAB — T-HELPER CELL (CD4) - (RCID CLINIC ONLY)
CD4 T CELL HELPER: 23 % — AB (ref 33–55)
CD4 T Cell Abs: 580 /uL (ref 400–2700)

## 2015-10-11 ENCOUNTER — Encounter: Payer: Self-pay | Admitting: Internal Medicine

## 2015-10-19 ENCOUNTER — Ambulatory Visit: Payer: Medicare HMO | Admitting: Internal Medicine

## 2015-11-11 ENCOUNTER — Encounter: Payer: Self-pay | Admitting: Internal Medicine

## 2015-11-11 ENCOUNTER — Ambulatory Visit (INDEPENDENT_AMBULATORY_CARE_PROVIDER_SITE_OTHER): Payer: Medicare HMO | Admitting: Internal Medicine

## 2015-11-11 DIAGNOSIS — B20 Human immunodeficiency virus [HIV] disease: Secondary | ICD-10-CM | POA: Diagnosis not present

## 2015-11-11 NOTE — Assessment & Plan Note (Signed)
His infection remains under perfect control with long-term viral suppression and normal CD4 counts. He will continue Genvoya follow-up after lab work in 6 months.

## 2015-11-11 NOTE — Progress Notes (Signed)
Patient ID: [name] Kim, male   DOB: 1950/10/11, 65 y.o.   MRN: [id number]          Patient Active Problem List   Diagnosis Date Noted  . Human immunodeficiency virus (HIV) disease ([place]) 01/23/2012    Priority: High  . 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 Acute renal insufficiency 10/31/2012  . Other malaise and fatigue 10/29/2012  . Weight loss, unintentional 10/29/2012  . Dyslipidemia 02/08/2012  . Coronary artery disease 01/23/2012  . Seizure disorder ([place]) 01/23/2012    Patient's Medications  New Prescriptions   No medications on file  Previous Medications   ASPIRIN 81 MG TABLET    Take 81 mg by mouth daily.   ATORVASTATIN (LIPITOR) 20 MG TABLET    TAKE 1 TABLET BY MOUTH EVERY DAY   ELVITEGRAVIR-COBICISTAT-EMTRICITABINE-TENOFOVIR (GENVOYA) 150-150-200-10 MG TABS TABLET    Take 1 tablet by mouth daily with breakfast.   FLUTICASONE (FLONASE) 50 MCG/ACT NASAL SPRAY    Place 1 spray into both nostrils daily. Reported on 11/11/2015   LAMOTRIGINE (LAMICTAL) 100 MG TABLET    Take 100 mg by mouth 2 (two) times daily.   OMEGA-3 ACID ETHYL ESTERS (LOVAZA) 1 G CAPSULE    TAKE 1 CAPSULE BY MOUTH TWICE DAILY  Modified Medications   No medications on file  Discontinued Medications   No medications on file    Subjective: [name] Kim is in for his routine HIV follow-up visit. He denies missing any doses of his Genvoya since his last visit. He is doing well.   Review of Systems: Review of Systems  Constitutional: Negative for fever, chills, weight loss, malaise/fatigue and diaphoresis.  HENT: Negative for sore throat.   Respiratory: Negative for cough, sputum production and shortness of breath.   Cardiovascular: Negative for chest pain.  Gastrointestinal: Negative for nausea, vomiting and diarrhea.  Genitourinary: Negative for dysuria and frequency.  Musculoskeletal: Negative for myalgias and joint  pain.  Skin: Negative for rash.  Neurological: Negative for focal weakness.  Psychiatric/Behavioral: Negative for depression and substance abuse. The patient is not nervous/anxious.     Past Medical History  Diagnosis Date  . HIV infection ([place])   . Hyperlipidemia   . CAD (coronary artery disease)   . Chronic kidney disease   . Seizures ([place])     last one Oct 2003  . 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 Smoking status: Former Smoker    Types: Cigarettes    Quit date: 05/22/2004  . Smokeless tobacco: Never Used  . Alcohol Use: No     Comment: about 2 per week    Family History  Problem Relation Age of Onset  . Cancer Mother   . Cancer Father     leukemia    Allergies  Allergen Reactions  . Azt [Zidovudine] Other (See Comments)    Caused bone marrow problems  2000    Objective:  Filed Vitals:   11/11/15 0858  BP: 150/80  Pulse: 54  Temp: 97.7 F (36.5 C)  TempSrc: Oral  Weight: 139 lb (63.05 kg)   Body mass index is 22.1 kg/(m^2).  Physical Exam  Constitutional: He is oriented to person, place, and time.  HENT:  Mouth/Throat: No oropharyngeal exudate.  He has a healing abrasion on his left forehead where he bumped his head on the freezer door.  Eyes: Conjunctivae are normal.  Cardiovascular: Normal rate and regular rhythm.   No murmur heard. Pulmonary/Chest: Breath sounds normal.  Abdominal: Soft. There is no tenderness.  Musculoskeletal: Normal range of motion.  Neurological: He is alert and oriented to person, place, and time. Gait normal.  Skin: No rash noted.  Psychiatric: Mood and affect normal.    Lab Results Lab Results  Component Value Date   WBC 8.5 10/05/2015   HGB 16.7 10/05/2015   HCT 48.5 10/05/2015   MCV 100.8* 10/05/2015   PLT 253 10/05/2015    Lab Results  Component Value Date   CREATININE 1.27* 10/05/2015   BUN 16 10/05/2015   NA 138 10/05/2015   K 4.8 10/05/2015   CL 101  10/05/2015   CO2 30 10/05/2015    Lab Results  Component Value Date   ALT 8* 10/05/2015   AST 12 10/05/2015   ALKPHOS 86 10/05/2015   BILITOT 0.5 10/05/2015    Lab Results  Component Value Date   CHOL 146 10/05/2015   HDL 42 10/05/2015   LDLCALC 74 10/05/2015   LDLDIRECT 126.1 02/19/2013   TRIG 151* 10/05/2015   CHOLHDL 3.5 10/05/2015    Lab Results HIV 1 RNA QUANT (copies/mL)  Date Value  10/05/2015 <20  04/05/2015 <20  09/29/2014 <20   CD4 T CELL ABS (/uL)  Date Value  10/05/2015 580  04/05/2015 600  09/29/2014 600      Problem List Items Addressed This Visit      High   Human immunodeficiency virus (HIV) disease (HCC) (Chronic)    His infection remains under perfect control with long-term viral suppression and normal CD4 counts. He will continue Genvoya follow-up after lab work in 6 months.      Relevant Orders   T-helper cell (CD4)- ([facility] clinic only)   HIV 1 RNA quant-no reflex-bld   CBC   Comprehensive metabolic panel   Lipid panel   RPR        Michel Bickers, MD [facility] for [place] [phone] pager   [phone] cell 11/11/2015, 9:51 AM

## 2015-11-15 ENCOUNTER — Other Ambulatory Visit: Payer: Self-pay | Admitting: Cardiovascular Disease

## 2015-12-20 ENCOUNTER — Encounter: Payer: Self-pay | Admitting: Cardiovascular Disease

## 2015-12-20 ENCOUNTER — Ambulatory Visit (INDEPENDENT_AMBULATORY_CARE_PROVIDER_SITE_OTHER): Payer: Medicare HMO | Admitting: Cardiovascular Disease

## 2015-12-20 VITALS — BP 150/70 | HR 51 | Ht 66.5 in | Wt 139.5 lb

## 2015-12-20 DIAGNOSIS — Z951 Presence of aortocoronary bypass graft: Secondary | ICD-10-CM | POA: Diagnosis not present

## 2015-12-20 DIAGNOSIS — B20 Human immunodeficiency virus [HIV] disease: Secondary | ICD-10-CM

## 2015-12-20 DIAGNOSIS — I2510 Atherosclerotic heart disease of native coronary artery without angina pectoris: Secondary | ICD-10-CM

## 2015-12-20 DIAGNOSIS — E785 Hyperlipidemia, unspecified: Secondary | ICD-10-CM | POA: Diagnosis not present

## 2015-12-20 NOTE — Patient Instructions (Addendum)
Medication Instructions:   No medication changes  Labwork:  No new labs  Follow-Up: It was a pleasure seeing you in the office today. Please call us if you have new issues that need to be addressed before your next appt.  [phone]  Your physician wants you to follow-up in: 6 months.  You will receive a reminder letter in the mail two months in advance. If you don't receive a letter, please call our office to schedule the follow-up appointment.  If you need a refill on your cardiac medications before your next appointment, please call your pharmacy.

## 2015-12-20 NOTE — Progress Notes (Signed)
Cardiology Office Note  Date:  12/20/2015   ID:  [name] Kim, DOB Dec 21, 1950, MRN [id number]  PCP:  Leamon Arnt, MD   Chief Complaint  Patient presents with  . Other    6 month follow up. Meds reviewed by the patient verbally. "doing well."     HPI:  [name] Kim is a pleasant 65 year old gentleman with a history of coronary artery disease, bypass surgery in 2007, chronic HIV infection on antibiotic therapy, Long history of smoking, mild COPD Prior stress test in 2012 showing no ischemia who presents for routine follow-up of his coronary artery disease  In follow-up, he reports that he is Walking well, doing lots of exercise, Also doing some Hand weights, Feels that his breathing got better after he Started spiriva Sits a lot at desk, right leg is hurting, attributes this to his seat Denies any symptoms concerning for angina Lab work reviewed with him in detail Total chol 146, LDL 74, tg 150  EKG shows normal sinus rhythm with rate 51 bpm, no significant ST or T-wave changes  Other past medical history [place], can walk the 2.5 mile loop Previously reported symptoms concerning for Raynaud's, not an issue on today's visit   PMH:   has a past medical history of CAD (coronary artery disease); Chronic kidney disease; HIV infection ([place]); Hyperlipidemia; Seizures ([place]); and Sleep behavior disorder, 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  PSH:    Past Surgical History:  Procedure Laterality Date  . APPENDECTOMY  1970  . CORONARY ARTERY BYPASS GRAFT  08-09-2005   LIMA-LAD, SVG-diagonal, SVG-OM  . TONSILECTOMY, ADENOIDECTOMY, BILATERAL MYRINGOTOMY AND TUBES  [phone]    Current Outpatient Prescriptions  Medication Sig Dispense Refill  . aspirin 81 MG tablet Take 81 mg by mouth daily.    [name] atorvastatin (LIPITOR) 20 MG tablet TAKE 1 TABLET BY MOUTH EVERY DAY 90 tablet 3  . elvitegravir-cobicistat-emtricitabine-tenofovir (GENVOYA) 150-150-200-10 MG TABS tablet Take 1 tablet by mouth  daily with breakfast. 30 tablet 11  . lamoTRIgine (LAMICTAL) 100 MG tablet Take 100 mg by mouth 2 (two) times daily.    [name] omega-3 acid ethyl esters (LOVAZA) 1 g capsule TAKE 1 CAPSULE BY MOUTH TWICE DAILY 180 capsule 3   No current facility-administered medications for this visit.      Allergies:   Azt [zidovudine]   Social History:  The patient  reports that he quit smoking about 11 years ago. His smoking use included Cigarettes. He has never used smokeless tobacco. He reports that he does not drink alcohol or use drugs.   Family History:   family history includes Cancer in his father and mother.    Review of Systems: Review of Systems  Constitutional: Negative.   Respiratory: Negative.   Cardiovascular: Negative.   Gastrointestinal: Negative.   Musculoskeletal: Negative.        Right leg pain  Neurological: Negative.   Psychiatric/Behavioral: Negative.   All other systems reviewed and are negative.    PHYSICAL EXAM: VS:  BP (!) 150/70 (BP Location: Left Arm, Patient Position: Sitting, Cuff Size: Normal)   Pulse (!) 51   Ht 5' 6.5" (1.689 m)   Wt 139 lb 8 oz (63.3 kg)   BMI 22.18 kg/m  , BMI Body mass index is 22.18 kg/m. GEN: Well nourished, well developed, in no acute distress  HEENT: normal  Neck: no JVD, carotid bruits, or masses Cardiac: RRR; no murmurs, rubs, or gallops,no edema  Respiratory:  clear to auscultation bilaterally, normal work  of breathing GI: soft, nontender, nondistended, + BS MS: no deformity or atrophy  Skin: warm and dry, no rash Neuro:  Strength and sensation are intact Psych: euthymic mood, full affect    Recent Labs: 10/05/2015: ALT 8; BUN 16; Creat 1.27; Hemoglobin 16.7; Platelets 253; Potassium 4.8; Sodium 138    Lipid Panel Lab Results  Component Value Date   CHOL 146 10/05/2015   HDL 42 10/05/2015   LDLCALC 74 10/05/2015   TRIG 151 (H) 10/05/2015      Wt Readings from Last 3 Encounters:  12/20/15 139 lb 8 oz (63.3 kg)   11/11/15 139 lb (63 kg)  06/21/15 138 lb 8 oz (62.8 kg)       ASSESSMENT AND PLAN:  Coronary artery disease involving native coronary artery of native heart without angina pectoris - Plan: EKG 12-Lead, EKG 12-Lead Currently with no symptoms of angina. No further workup at this time. Continue current medication regimen.  Human immunodeficiency virus (HIV) disease ([place]) Reports he is stable, sees infectious disease once per year  Dyslipidemia Cholesterol is at goal on the current lipid regimen. No changes to the medications were made.  S/P CABG (coronary artery bypass graft) Denies any symptoms concerning for angina  Long discussion concerning his exercise regiment, Presents with his partner who is also having medical issues  Total encounter time more than 25 minutes  Greater than 50% was spent in counseling and coordination of care with the patient   Disposition:   F/U  6 months   Orders Placed This Encounter  Procedures  . EKG 12-Lead  . EKG 12-Lead     Signed, Esmond Plants, M.D., Ph.D. 12/20/2015  [place], [place]

## 2016-02-01 ENCOUNTER — Other Ambulatory Visit: Payer: Self-pay | Admitting: Cardiovascular Disease

## 2016-06-19 ENCOUNTER — Ambulatory Visit (INDEPENDENT_AMBULATORY_CARE_PROVIDER_SITE_OTHER): Payer: Medicare HMO | Admitting: Cardiovascular Disease

## 2016-06-19 ENCOUNTER — Encounter: Payer: Self-pay | Admitting: Cardiovascular Disease

## 2016-06-19 VITALS — BP 137/70 | HR 56 | Ht 66.0 in | Wt 146.2 lb

## 2016-06-19 DIAGNOSIS — B20 Human immunodeficiency virus [HIV] disease: Secondary | ICD-10-CM | POA: Diagnosis not present

## 2016-06-19 DIAGNOSIS — I2510 Atherosclerotic heart disease of native coronary artery without angina pectoris: Secondary | ICD-10-CM

## 2016-06-19 DIAGNOSIS — E785 Hyperlipidemia, unspecified: Secondary | ICD-10-CM | POA: Diagnosis not present

## 2016-06-19 DIAGNOSIS — Z951 Presence of aortocoronary bypass graft: Secondary | ICD-10-CM

## 2016-06-19 NOTE — Patient Instructions (Signed)

## 2016-06-19 NOTE — Progress Notes (Signed)
Cardiology Office Note  Date:  06/19/2016   ID:  MELESIO MADARA, DOB 07/19/1950, MRN [id number]  PCP:  Leamon Arnt, MD   Chief Complaint  Patient presents with  . other    6 month follow up Shortness of breath when walking, overall doing well. . Meds reviewed verbally with patient.     HPI:  Mr. Brailsford is a pleasant 66 year-old gentleman with a history of coronary artery disease, bypass surgery in 2007, chronic HIV infection on antibiotic therapy, Long history of smoking, mild COPD Prior stress test in 2012 showing no ischemia who presents for routine follow-up of his coronary artery disease  In follow-up, he reports that he is Walking well, doing lots of exercise, Doing less exercise in the cold weather BP elevated last few visits On recheck today blood pressure down to 132 systolic over 31 Feels that his breathing got better after he Started spiriva  Previously reported that he sits a lot at his desk Denies any symptoms concerning for angina  Previous lab work reviewed,   climb in total cholesterol up to greater than 150 Possibly secondary to mild weight gain  EKG shows normal sinus rhythm with rate 56 bpm, no significant ST or T-wave changes  Other past medical history [place], can [place] the [place].5 mile loop Previously reported symptoms concerning for Raynaud's, not an issue on today's visit   PMH:   has a past medical history of CAD (coronary artery disease); Chronic kidney disease; HIV infection ([place]); Hyperlipidemia; Seizures ([place]); and Sleep behavior disorder, 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  PSH:    Past Surgical History:  Procedure Laterality Date  . APPENDECTOMY  1970  . CORONARY ARTERY BYPASS GRAFT  08-09-2005   LIMA-LAD, SVG-diagonal, SVG-OM  . TONSILECTOMY, ADENOIDECTOMY, BILATERAL MYRINGOTOMY AND TUBES  [phone]    Current Outpatient Prescriptions  Medication Sig Dispense Refill  . aspirin 81 MG tablet Take 81 mg by mouth daily.    [name] atorvastatin  (LIPITOR) 20 MG tablet TAKE 1 TABLET BY MOUTH EVERY DAY 90 tablet 3  . elvitegravir-cobicistat-emtricitabine-tenofovir (GENVOYA) 150-150-200-10 MG TABS tablet Take 1 tablet by mouth daily with breakfast. 30 tablet 11  . lamoTRIgine (LAMICTAL) 100 MG tablet Take 100 mg by mouth 2 (two) times daily.    [name] omega-3 acid ethyl esters (LOVAZA) 1 g capsule TAKE 1 CAPSULE BY MOUTH TWICE DAILY 180 capsule 3   No current facility-administered medications for this visit.      Allergies:   Azt [zidovudine]   Social History:  The patient  reports that he quit smoking about 12 years ago. His smoking use included Cigarettes. He has never used smokeless tobacco. He reports that he does not drink alcohol or use drugs.   Family History:   family history includes Cancer in his father and mother.    Review of Systems: Review of Systems  Constitutional: Negative.   Respiratory: Negative.   Cardiovascular: Negative.   Gastrointestinal: Negative.   Musculoskeletal: Negative.   Neurological: Negative.   Psychiatric/Behavioral: Negative.   All other systems reviewed and are negative.    PHYSICAL EXAM: VS:  BP 137/70 (BP Location: Left Arm, Patient Position: Sitting, Cuff Size: Normal)   Pulse (!) 56   Ht '5\' 6"'$  (1.676 m)   Wt 146 lb 4 oz (66.3 kg)   BMI 23.61 kg/m  , BMI Body mass index is 23.61 kg/m. GEN: Well nourished, well developed, in no acute distress  HEENT: normal  Neck: no JVD, carotid  bruits, or masses Cardiac: RRR; no murmurs, rubs, or gallops,no edema  Respiratory:  clear to auscultation bilaterally, normal work of breathing GI: soft, nontender, nondistended, + BS MS: no deformity or atrophy  Skin: warm and dry, no rash Neuro:  Strength and sensation are intact Psych: euthymic mood, full affect    Recent Labs: 10/05/2015: ALT 8; BUN 16; Creat 1.27; Hemoglobin 16.7; Platelets 253; Potassium 4.8; Sodium 138    Lipid Panel Lab Results  Component Value Date   CHOL 146 10/05/2015    HDL 42 10/05/2015   LDLCALC 74 10/05/2015   TRIG 151 (H) 10/05/2015      Wt Readings from Last 3 Encounters:  06/19/16 146 lb 4 oz (66.3 kg)  heavy stuff on today 143 at home  12/20/15 139 lb 8 oz (63.3 kg)  11/11/15 139 lb (63 kg)       ASSESSMENT AND PLAN:  Coronary artery disease involving native coronary artery of native heart without angina pectoris - Plan: EKG 12-Lead Currently with no symptoms of angina. No further workup at this time. Continue current medication regimen.  Human immunodeficiency virus (HIV) disease ([place]) Managed by ID  Dyslipidemia Cholesterol is climbing, recommended he work on his weight  if cholesterol continues to run above goal, may need to increase his Lipitor  S/P CABG (coronary artery bypass graft) Denies any anginal symptoms   Total encounter time more than 15 minutes  Greater than 50% was spent in counseling and coordination of care with the patient   Disposition:   F/U  6 months   Orders Placed This Encounter  Procedures  . EKG 12-Lead     Signed, Esmond Plants, M.D., Ph.D. 06/19/2016  [place], [place]

## 2016-07-23 ENCOUNTER — Other Ambulatory Visit: Payer: Self-pay | Admitting: Internal Medicine

## 2016-07-23 DIAGNOSIS — B20 Human immunodeficiency virus [HIV] disease: Secondary | ICD-10-CM

## 2016-10-12 ENCOUNTER — Other Ambulatory Visit: Payer: Medicare HMO

## 2016-10-12 DIAGNOSIS — B20 Human immunodeficiency virus [HIV] disease: Secondary | ICD-10-CM

## 2016-10-12 LAB — COMPREHENSIVE METABOLIC PANEL
ALK PHOS: 73 U/L (ref 40–115)
ALT: 9 U/L (ref 9–46)
AST: 11 U/L (ref 10–35)
Albumin: 4.4 g/dL (ref 3.6–5.1)
BUN: 14 mg/dL (ref 7–25)
CALCIUM: 9.5 mg/dL (ref 8.6–10.3)
CO2: 28 mmol/L (ref 20–31)
Chloride: 103 mmol/L (ref 98–110)
Creat: 1.24 mg/dL (ref 0.70–1.25)
Glucose, Bld: 93 mg/dL (ref 65–99)
POTASSIUM: 5 mmol/L (ref 3.5–5.3)
Sodium: 138 mmol/L (ref 135–146)
TOTAL PROTEIN: 6.8 g/dL (ref 6.1–8.1)
Total Bilirubin: 0.5 mg/dL (ref 0.2–1.2)

## 2016-10-12 LAB — CBC
HEMATOCRIT: 47.5 % (ref 38.5–50.0)
Hemoglobin: 15.9 g/dL (ref 13.2–17.1)
MCH: 33.9 pg — ABNORMAL HIGH (ref 27.0–33.0)
MCHC: 33.5 g/dL (ref 32.0–36.0)
MCV: 101.3 fL — AB (ref 80.0–100.0)
MPV: 9.7 fL (ref 7.5–12.5)
PLATELETS: 260 10*3/uL (ref 140–400)
RBC: 4.69 MIL/uL (ref 4.20–5.80)
RDW: 13.3 % (ref 11.0–15.0)
WBC: 8.7 10*3/uL (ref 3.8–10.8)

## 2016-10-12 LAB — LIPID PANEL
CHOL/HDL RATIO: 3.4 ratio (ref <5.0)
Cholesterol: 133 mg/dL (ref <200)
HDL: 39 mg/dL — ABNORMAL LOW (ref >40)
LDL Cholesterol: 70 mg/dL (ref <100)
Triglycerides: 118 mg/dL (ref <150)
VLDL: 24 mg/dL (ref <30)

## 2016-10-13 LAB — T-HELPER CELL (CD4) - (RCID CLINIC ONLY)
CD4 % Helper T Cell: 20 % — ABNORMAL LOW (ref 33–55)
CD4 T CELL ABS: 500 /uL (ref 400–2700)

## 2016-10-13 LAB — RPR

## 2016-10-18 LAB — HIV-1 RNA QUANT-NO REFLEX-BLD
HIV 1 RNA QUANT: NOT DETECTED {copies}/mL
HIV-1 RNA Quant, Log: <1.3 Log copies/mL

## 2016-10-23 ENCOUNTER — Encounter: Payer: Self-pay | Admitting: Internal Medicine

## 2016-10-24 ENCOUNTER — Other Ambulatory Visit: Payer: Self-pay | Admitting: Internal Medicine

## 2016-10-24 DIAGNOSIS — B20 Human immunodeficiency virus [HIV] disease: Secondary | ICD-10-CM

## 2016-10-26 ENCOUNTER — Encounter: Payer: Self-pay | Admitting: Internal Medicine

## 2016-10-26 ENCOUNTER — Ambulatory Visit (INDEPENDENT_AMBULATORY_CARE_PROVIDER_SITE_OTHER): Payer: Medicare HMO | Admitting: Internal Medicine

## 2016-10-26 DIAGNOSIS — B20 Human immunodeficiency virus [HIV] disease: Secondary | ICD-10-CM

## 2016-10-26 DIAGNOSIS — J301 Allergic rhinitis due to pollen: Secondary | ICD-10-CM | POA: Diagnosis not present

## 2016-10-26 DIAGNOSIS — J302 Other seasonal allergic rhinitis: Secondary | ICD-10-CM | POA: Insufficient documentation

## 2016-10-26 MED ORDER — FEXOFENADINE HCL 180 MG PO TABS: 180.0000 mg | ORAL_TABLET | Freq: Every day | ORAL | Status: DC

## 2016-10-26 NOTE — Progress Notes (Signed)
Patient Active Problem List   Diagnosis Date Noted  . Human immunodeficiency virus (HIV) disease ([place]) 01/23/2012    Priority: High  . Seasonal allergies 10/26/2016  . 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 Acute renal insufficiency 10/31/2012  . Other malaise and fatigue 10/29/2012  . Weight loss, unintentional 10/29/2012  . Dyslipidemia 02/08/2012  . Coronary artery disease 01/23/2012  . Seizure disorder ([place]) 01/23/2012    Patient's Medications  New Prescriptions   No medications on file  Previous Medications   ASPIRIN 81 MG TABLET    Take 81 mg by mouth daily.   ATORVASTATIN (LIPITOR) 20 MG TABLET    TAKE 1 TABLET BY MOUTH EVERY DAY   CHOLECALCIFEROL (VITAMIN D) 1000 UNITS TABLET    Take 1,000 Units by mouth daily.   GENVOYA 150-150-200-10 MG TABS TABLET    TAKE 1 TABLET BY MOUTH EVERY DAY WTH BREAKFAST   LAMOTRIGINE (LAMICTAL) 100 MG TABLET    Take 100 mg by mouth 2 (two) times daily.   OMEGA-3 ACID ETHYL ESTERS (LOVAZA) 1 G CAPSULE    TAKE 1 CAPSULE BY MOUTH TWICE DAILY  Modified Medications   No medications on file  Discontinued Medications   No medications on file    Subjective: [name] Kim is in for his routine HIV follow-up visit. He has had no problems obtaining, taking or tolerating his Genvoya and never misses doses. He recently started on fluticasone nasal spray for his seasonal allergies. Otherwise he is feeling well.  Review of Systems: Review of Systems  Constitutional: Negative for chills, diaphoresis, fever, malaise/fatigue and weight loss.  HENT: Positive for congestion. Negative for sore throat.   Respiratory: Positive for cough. Negative for sputum production and shortness of breath.   Cardiovascular: Negative for chest pain.  Gastrointestinal: Negative for abdominal pain, diarrhea, heartburn, nausea and vomiting.  Genitourinary: Negative for dysuria and  frequency.  Musculoskeletal: Negative for joint pain and myalgias.  Skin: Negative for rash.  Neurological: Negative for dizziness and headaches.  Psychiatric/Behavioral: Negative for depression and substance abuse. The patient is not nervous/anxious.     Past Medical History:  Diagnosis Date  . CAD (coronary artery disease)   . Chronic kidney disease   . HIV infection ([place])   . Hyperlipidemia   . Seizures ([place])    last one Oct 2003  . 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 Smoking status: Former Smoker    Types: Cigarettes    Quit date: 05/22/2004  . Smokeless tobacco: Never Used  . Alcohol use No     Comment: about 2 per week    Family History  Problem Relation Age of Onset  . Cancer Mother   . Cancer Father        leukemia    Allergies  Allergen Reactions  . Azt [Zidovudine] Other (See Comments)    Caused bone marrow problems  2000    Objective:  Vitals:   10/26/16 0843  BP: (!) 162/69  Pulse: 87  Temp: 98.2 F (36.8 C)  TempSrc: Oral  Weight: 136 lb (61.7 kg)  Height: 5\' 7"  (1.702 m)   Body mass index is 21.3 kg/m.  Physical Exam  Constitutional: He is oriented to person, place, and time.  He is in good spirits. He sounds congested. He has some frequent dry cough.  HENT:  Mouth/Throat: No oropharyngeal exudate.  Eyes: Conjunctivae are normal.  Cardiovascular: Normal rate and regular rhythm.   No murmur heard. Pulmonary/Chest: Effort normal and breath sounds normal. He has no wheezes. He has no rales.  Abdominal: Soft. He exhibits no mass. There is no tenderness.  Musculoskeletal: Normal range of motion.  Neurological: He is alert and oriented to person, place, and time.  Skin: No rash noted.  Psychiatric: Mood and affect normal.    Lab Results Lab Results  Component Value Date   WBC 8.7 10/12/2016   HGB 15.9 10/12/2016   HCT 47.5 10/12/2016   MCV 101.3 (H) 10/12/2016   PLT 260 10/12/2016      Lab Results  Component Value Date   CREATININE 1.24 10/12/2016   BUN 14 10/12/2016   NA 138 10/12/2016   K 5.0 10/12/2016   CL 103 10/12/2016   CO2 28 10/12/2016    Lab Results  Component Value Date   ALT 9 10/12/2016   AST 11 10/12/2016   ALKPHOS 73 10/12/2016   BILITOT 0.5 10/12/2016    Lab Results  Component Value Date   CHOL 133 10/12/2016   HDL 39 (L) 10/12/2016   LDLCALC 70 10/12/2016   LDLDIRECT 126.1 02/19/2013   TRIG 118 10/12/2016   CHOLHDL 3.4 10/12/2016   HIV 1 RNA Quant (copies/mL)  Date Value  10/12/2016 <20 NOT DETECTED  10/05/2015 <20  04/05/2015 <20   CD4 T Cell Abs (/uL)  Date Value  10/12/2016 500  10/05/2015 580  04/05/2015 600     Problem List Items Addressed This Visit      High   Human immunodeficiency virus (HIV) disease ([place]) (Chronic)    His HIV infection is under excellent long-term control. Unfortunately Genvoya and fluticasone have a drug drug interaction that can increase systemic steroid levels. I had him speak with Onnie Boer, our infectious disease pharmacist to review his options. We offer the option of changing Genvoya to the new single tablet regimen Biktarvy but he prefers to stay on Genvoya and stop the fluticasone. He will start on over-the-counter Allegra. He will follow-up after blood work in 6 months.      Relevant Orders   T-helper cell (CD4)- ([facility] clinic only)   HIV 1 RNA quant-no reflex-bld     Unprioritized   Seasonal allergies   Relevant Medications   fexofenadine (ALLEGRA) tablet 180 mg (Start on 10/26/2016 10:00 AM)        Michel Bickers, MD [facility] [phone] pager   [phone] cell 10/26/2016, 9:22 AM

## 2016-10-26 NOTE — Assessment & Plan Note (Signed)
His HIV infection is under excellent long-term control. Unfortunately Genvoya and fluticasone have a drug drug interaction that can increase systemic steroid levels. I had him speak with Onnie Boer, our infectious disease pharmacist to review his options. We offer the option of changing Genvoya to the new single tablet regimen Biktarvy but he prefers to stay on Genvoya and stop the fluticasone. He will start on over-the-counter Allegra. He will follow-up after blood work in 6 months.

## 2016-10-26 NOTE — Progress Notes (Signed)
HPI: [name] Kim is a 66 y.o. male who here for his HIV visit.   Allergies: Allergies  Allergen Reactions  . Azt [Zidovudine] Other (See Comments)    Caused bone marrow problems  2000    Vitals: Temp: 98.2 F (36.8 C) (06/07 0843) Temp Source: Oral (06/07 0843) BP: 162/69 (06/07 0843) Pulse Rate: 87 (06/07 0843)  Past Medical History: Past Medical History:  Diagnosis Date  . CAD (coronary artery disease)   . Chronic kidney disease   . HIV infection ([place])   . Hyperlipidemia   . Seizures ([place])    last one Oct 2003  . Sleep behavior disorder, 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    Social History: Social History   Social History  . Marital status: Single    Spouse name: N/A  . Number of children: N/A  . Years of education: N/A   Occupational History  . Product/process development scientist- retired    Social History Main Topics  . Smoking status: Former Smoker    Types: Cigarettes    Quit date: 05/22/2004  . Smokeless tobacco: Never Used  . Alcohol use No     Comment: about 2 per week  . Drug use: No  . Sexual activity: Yes    Partners: Male    Birth control/ protection: Condom     Comment: declined condoms   Other Topics Concern  . None   Social History Narrative   Patient is right handed.   Patient drinks 1 cup of coffee daily.    Previous Regimen: ISN/TRV  Current Regimen: Genvoya  Labs: HIV 1 RNA Quant (copies/mL)  Date Value  10/12/2016 <20 NOT DETECTED  10/05/2015 <20  04/05/2015 <20   CD4 T Cell Abs (/uL)  Date Value  10/12/2016 500  10/05/2015 580  04/05/2015 600   Hep B S Ab (no units)  Date Value  01/16/2012 TEST NOT PERFORMED  01/16/2012 POS (A)   Hepatitis B Surface Ag (no units)  Date Value  01/16/2012 TEST NOT PERFORMED  01/16/2012 NEGATIVE   HCV Ab (no units)  Date Value  01/16/2012 TEST NOT PERFORMED  01/16/2012 NEGATIVE    CrCl: Estimated Creatinine Clearance: 51.1 mL/min (by C-G formula based on SCr of 1.24 mg/dL).  Lipids:    Component  Value Date/Time   CHOL 133 10/12/2016 0837   TRIG 118 10/12/2016 0837   HDL 39 (L) 10/12/2016 0837   CHOLHDL 3.4 10/12/2016 0837   VLDL 24 10/12/2016 0837   LDLCALC 70 10/12/2016 0837    Assessment: [name] Kim is here for his routine visit for his HIV. He is super compliance with his Genvoya. He takes it every morning with his breakfast. Dr. Megan Salon tried to convince him to change it to [facility] due to the fact that his PCP recently put him on fluticasone. Even after a 15 minutes conversation, [name] Kim still would like to stay on his Jorje Guild because it's been working for him. Advised him to stop fluticasone and pick up some generic Allegra instead but he'll probably has to pay out of pocket because it's OTC.   Recommendations:  Cont Genvoya 1 PO qday Stop fluticasone Pick some Allegra instead  Onnie Boer, PharmD, BCPS, AAHIVP, CPP Clinical Infectious [place] [facility] 10/26/2016, 9:21 AM

## 2016-12-11 ENCOUNTER — Other Ambulatory Visit: Payer: Self-pay | Admitting: Cardiovascular Disease

## 2016-12-18 DIAGNOSIS — Z87891 Personal history of nicotine dependence: Secondary | ICD-10-CM | POA: Insufficient documentation

## 2016-12-18 DIAGNOSIS — Z720 Tobacco use: Secondary | ICD-10-CM | POA: Insufficient documentation

## 2016-12-18 NOTE — Progress Notes (Signed)
Cardiology Office Note  Date:  12/19/2016   ID:  [name] Kim, [name] Kim [birth year], MRN [id number]  PCP:  Leamon Arnt, MD   Chief Complaint  Patient presents with  . other    6 month follow up. Patient c/o blood pressure running high. Meds reviewed vebally with patient.     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[place], lots of fumes Has a membership to the [facility] but does not go Blood pressure elevated on today's visit Not particularly eager to start a pill for blood pressure Typically blood pressure runs 130s Feels that his breathing got better after he Started spiriva  Labs reviewed Total chol 133, LDL 70  sits a lot at his desk Denies any symptoms concerning for angina   Weight is stable 139 pounds EKG shows normal sinus rhythm with rate 54 bpm, no significant ST or T-wave changes  Other past medical history [place], can walk the 2.5 mile loop Previously reported symptoms concerning for Raynaud's, not an issue on today's visit   PMH:   has a past medical history of CAD (coronary artery disease); Chronic kidney disease; HIV infection ([place]); Hyperlipidemia; Seizures ([place]); and Sleep behavior disorder, 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  PSH:    Past Surgical History:  Procedure Laterality Date  . APPENDECTOMY  1970  . CORONARY ARTERY BYPASS GRAFT  08-09-2005   LIMA-LAD, SVG-diagonal, SVG-OM  . TONSILECTOMY, ADENOIDECTOMY, BILATERAL MYRINGOTOMY AND TUBES  [phone]    Current Outpatient Prescriptions  Medication Sig Dispense Refill  . aspirin 81 MG tablet Take 81 mg by mouth daily.    [name] atorvastatin (LIPITOR) 20  MG tablet TAKE 1 TABLET BY MOUTH EVERY DAY 90 tablet 3  . cholecalciferol (VITAMIN D) 1000 units tablet Take 1,000 Units by mouth daily.    . GENVOYA 150-150-200-10 MG TABS tablet TAKE 1 TABLET BY MOUTH EVERY DAY WTH BREAKFAST 30 tablet 5  . lamoTRIgine (LAMICTAL) 100 MG tablet Take 100 mg by mouth 2 (two) times daily.    [name] omega-3 acid ethyl esters (LOVAZA) 1 g capsule TAKE 1 CAPSULE BY MOUTH TWICE DAILY 180 capsule 3  . omega-3 acid ethyl esters (LOVAZA) 1 g capsule TAKE 1 CAPSULE BY MOUTH TWICE DAILY 180 capsule 3   Current Facility-Administered Medications  Medication Dose Route Frequency Provider Last Rate Last Dose  . fexofenadine (ALLEGRA) tablet 180 mg  180 mg Oral Daily Michel Bickers, MD         Allergies:   Azt [zidovudine]   Social History:  The patient  reports that he quit smoking about 12 years ago. His smoking use included Cigarettes. He has never used smokeless tobacco. He reports that he does not drink alcohol or use drugs.   Family History:   family history includes Cancer in his father and mother.    Review of Systems: Review of Systems  Constitutional: Negative.   Respiratory: Negative.   Cardiovascular: Negative.   Gastrointestinal: Negative.   Musculoskeletal: Negative.   Neurological: Negative.   Psychiatric/Behavioral: Negative.   All other systems reviewed and are negative.    PHYSICAL EXAM: VS:  BP (!) 156/68 (BP  Location: Left Arm, Patient Position: Sitting, Cuff Size: Normal)   Pulse (!) 54   Ht 5\' 4"  (1.626 m)   Wt 139 lb 4 oz (63.2 kg)   BMI 23.90 kg/m  , BMI Body mass index is 23.9 kg/m.  GEN: Well nourished, well developed, in no acute distress  HEENT: normal  Neck: no JVD, carotid bruits, or masses Cardiac: RRR; no murmurs, rubs, or gallops,no edema  Respiratory:  clear to auscultation bilaterally, normal work of breathing GI: soft, nontender, nondistended, + BS MS: no deformity or atrophy  Skin: warm and dry, no rash Neuro:   Strength and sensation are intact Psych: euthymic mood, full affect    Recent Labs: 10/12/2016: ALT 9; BUN 14; Creat 1.24; Hemoglobin 15.9; Platelets 260; Potassium 5.0; Sodium 138    Lipid Panel Lab Results  Component Value Date   CHOL 133 10/12/2016   HDL 39 (L) 10/12/2016   LDLCALC 70 10/12/2016   TRIG 118 10/12/2016      Wt Readings from Last 3 Encounters:  06/19/16 146 lb 4 oz (66.3 kg)  heavy stuff on today 143 at home  12/20/15 139 lb 8 oz (63.3 kg)  11/11/15 139 lb (63 kg)       ASSESSMENT AND PLAN:   Coronary artery disease involving native coronary artery of native heart without angina pectoris - Plan: EKG 12-Lead Currently with no symptoms of angina. No further workup at this time. Continue current medication regimen.  Human immunodeficiency virus (HIV) disease ([place]) Managed by ID  Dyslipidemia Cholesterol is at goal on the current lipid regimen. No changes to the medications were made.  S/P CABG (coronary artery bypass graft) Denies any anginal symptoms  Essential hypertension Blood pressure elevated today, recommended he monitor blood pressure at home and call our office if these continue to run high   Total encounter time more than 25 minutes  Greater than 50% was spent in counseling and coordination of care with the patient   Disposition:   F/U  12 months   Orders Placed This Encounter  Procedures  . EKG 12-Lead     Signed, Esmond Plants, M.D., Ph.D. 12/19/2016  [place], [place]

## 2016-12-19 ENCOUNTER — Encounter: Payer: Self-pay | Admitting: Cardiovascular Disease

## 2016-12-19 ENCOUNTER — Ambulatory Visit (INDEPENDENT_AMBULATORY_CARE_PROVIDER_SITE_OTHER): Payer: Medicare HMO | Admitting: Cardiovascular Disease

## 2016-12-19 VITALS — BP 156/68 | HR 54 | Ht 64.0 in | Wt 139.2 lb

## 2016-12-19 DIAGNOSIS — I25118 Atherosclerotic heart disease of native coronary artery with other forms of angina pectoris: Secondary | ICD-10-CM | POA: Diagnosis not present

## 2016-12-19 DIAGNOSIS — E785 Hyperlipidemia, unspecified: Secondary | ICD-10-CM | POA: Diagnosis not present

## 2016-12-19 DIAGNOSIS — B20 Human immunodeficiency virus [HIV] disease: Secondary | ICD-10-CM

## 2016-12-19 DIAGNOSIS — Z951 Presence of aortocoronary bypass graft: Secondary | ICD-10-CM

## 2016-12-19 DIAGNOSIS — Z87891 Personal history of nicotine dependence: Secondary | ICD-10-CM | POA: Diagnosis not present

## 2016-12-19 DIAGNOSIS — I10 Essential (primary) hypertension: Secondary | ICD-10-CM

## 2016-12-19 MED ORDER — ATORVASTATIN CALCIUM 20 MG PO TABS: 20.0000 mg | ORAL_TABLET | Freq: Every day | ORAL | 3 refills | Status: DC

## 2016-12-19 NOTE — Patient Instructions (Addendum)
Please monitor blood pressure Goal is <140 on the top <90 on the bottom    Medication Instructions:   No medication changes made  Labwork:  No new labs needed  Testing/Procedures:  No further testing at this time   Follow-Up: It was a pleasure seeing you in the office today. Please call us if you have new issues that need to be addressed before your next appt.  [phone]  Your physician wants you to follow-up in: 6 months.  You will receive a reminder letter in the mail two months in advance. If you don't receive a letter, please call our office to schedule the follow-up appointment.  If you need a refill on your cardiac medications before your next appointment, please call your pharmacy.

## 2017-01-29 ENCOUNTER — Other Ambulatory Visit: Payer: Self-pay | Admitting: Cardiovascular Disease

## 2017-04-27 ENCOUNTER — Ambulatory Visit: Payer: Medicare HMO | Admitting: Family Medicine

## 2017-04-27 ENCOUNTER — Other Ambulatory Visit: Payer: Self-pay | Admitting: Internal Medicine

## 2017-04-27 DIAGNOSIS — B20 Human immunodeficiency virus [HIV] disease: Secondary | ICD-10-CM

## 2017-06-16 NOTE — Progress Notes (Signed)
Cardiology Office Note  Date:  06/18/2017   ID:  [name] Kim, [name] Kim [birth year], MRN [id number]  PCP:  Leamon Arnt, MD   Chief Complaint  Patient presents with  . other    6 month follow up. Meds reviewed by the pt. verbally. "doing well."     HPI:  [name] Kim is a pleasant 67 year-old gentleman with a history of  coronary artery disease,  bypass surgery in 2007,  chronic HIV infection on antibiotic therapy, Long history of smoking,  mild COPD Prior stress test in 2012 showing no ischemia  who presents for routine follow-up of his coronary artery disease, CABG  High blood pressure at home high 140s, 38s Recheck by myself today 138/70 Was initially elevated on arrival 706 systolic Active, no regular exercise but does stay busy Denies any symptoms of chest pain or shortness of breath on exertion Has a membership to the [facility] but does not go [place] the [place]  Feels that his breathing got better after he Started spiriva  Labs reviewed Total chol 133, LDL 70 Lab work done through infectious disease  Weight is stable  EKG shows normal sinus rhythm with rate 57 bpm, no significant ST or T-wave changes, ATC noted  Other past medical history [place], can walk the [place].5 mile loop Previously reported symptoms concerning for Raynaud's, not an issue on today's visit   PMH:   has a past medical history of CAD (coronary artery disease), Chronic kidney disease, HIV infection ([place]), Hyperlipidemia, Seizures ([place]), and Sleep behavior disorder, 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  PSH:    Past Surgical History:  Procedure Laterality Date  . APPENDECTOMY  1970  . CORONARY ARTERY BYPASS GRAFT  08-09-2005   LIMA-LAD, SVG-diagonal, SVG-OM  . TONSILECTOMY, ADENOIDECTOMY, BILATERAL MYRINGOTOMY AND TUBES  [phone]    Current Outpatient Medications  Medication Sig Dispense Refill  . aspirin 81 MG tablet Take 81 mg by mouth daily.    [name] atorvastatin (LIPITOR) 20 MG tablet TAKE 1  TABLET BY MOUTH EVERY DAY 90 tablet 1  . cholecalciferol (VITAMIN D) 1000 units tablet Take 1,000 Units by mouth daily.    . GENVOYA 150-150-200-10 MG TABS tablet TAKE 1 TABLET BY MOUTH EVERY DAY WTH BREAKFAST 30 tablet 5  . lamoTRIgine (LAMICTAL) 100 MG tablet Take 100 mg by mouth 2 (two) times daily.    [name] omega-3 acid ethyl esters (LOVAZA) 1 g capsule TAKE 1 CAPSULE BY MOUTH TWICE DAILY 180 capsule 3   Current Facility-Administered Medications  Medication Dose Route Frequency Provider Last Rate Last Dose  . fexofenadine (ALLEGRA) tablet 180 mg  180 mg Oral Daily Michel Bickers, MD         Allergies:   Azt [zidovudine]   Social History:  The patient  reports that he quit smoking about 13 years ago. His smoking use included cigarettes. he has never used smokeless tobacco. He reports that he does not drink alcohol or use drugs.   Family History:   family history includes Cancer in his father and mother.    Review of Systems: Review of Systems  Constitutional: Negative.   Respiratory: Negative.   Cardiovascular: Negative.   Gastrointestinal: Negative.   Musculoskeletal: Negative.   Neurological: Negative.   Psychiatric/Behavioral: Negative.   All other systems reviewed and are negative.    PHYSICAL EXAM: VS:  BP (!) 150/64 (BP Location: Left Arm, Patient Position: Sitting, Cuff Size: Normal)   Pulse (!) 57   Ht 5'  7" (1.702 m)   Wt 145 lb 8 oz (66 kg)   BMI 22.79 kg/m  , BMI Body mass index is 22.79 kg/m.  GEN: Well nourished, well developed, in no acute distress  HEENT: normal  Neck: no JVD, carotid bruits, or masses Cardiac: RRR; no murmurs, rubs, or gallops,no edema  Respiratory:  clear to auscultation bilaterally, normal work of breathing GI: soft, nontender, nondistended, + BS MS: no deformity or atrophy  Skin: warm and dry, no rash Neuro:  Strength and sensation are intact Psych: euthymic mood, full affect    Recent Labs: 10/12/2016: ALT 9; BUN 14; Creat  1.24; Hemoglobin 15.9; Platelets 260; Potassium 5.0; Sodium 138    Lipid Panel Lab Results  Component Value Date   CHOL 133 10/12/2016   HDL 39 (L) 10/12/2016   LDLCALC 70 10/12/2016   TRIG 118 10/12/2016      Wt Readings from Last 3 Encounters:  06/19/16 146 lb 4 oz (66.3 kg)  heavy stuff on today 143 at home  12/20/15 139 lb 8 oz (63.3 kg)  11/11/15 139 lb (63 kg)    Filed Weights   06/18/17 0924  Weight: 145 lb 8 oz (66 kg)      ASSESSMENT AND PLAN:   Coronary artery disease involving native coronary artery of native heart without angina pectoris - Plan: EKG 12-Lead Currently with no symptoms of angina. No further workup at this time. Continue current medication regimen.  Stable  Human immunodeficiency virus (HIV) disease ([place]) Managed by ID Has appointment next several months  Dyslipidemia Cholesterol is at goal on the current lipid regimen. No changes to the medications were made.  Stable  S/P CABG (coronary artery bypass graft) Denies any anginal symptoms Discussed anginal symptoms in detail, what to watch for  Essential hypertension Initially elevated on arrival, improved on recheck  Weight stable, he will monitor blood pressure at home  Prefers not to be on blood pressure medication  Does run high on a regular basis could try amlodipine   Total encounter time more than 25 minutes  Greater than 50% was spent in counseling and coordination of care with the patient   Disposition:   F/U  12 months   Orders Placed This Encounter  Procedures  . EKG 12-Lead     Signed, Esmond Plants, M.D., Ph.D. 06/18/2017  [place], [place]

## 2017-06-18 ENCOUNTER — Encounter: Payer: Self-pay | Admitting: Cardiovascular Disease

## 2017-06-18 ENCOUNTER — Ambulatory Visit (INDEPENDENT_AMBULATORY_CARE_PROVIDER_SITE_OTHER): Payer: Medicare HMO | Admitting: Cardiovascular Disease

## 2017-06-18 VITALS — BP 138/78 | HR 57 | Ht 67.0 in | Wt 145.5 lb

## 2017-06-18 DIAGNOSIS — I25118 Atherosclerotic heart disease of native coronary artery with other forms of angina pectoris: Secondary | ICD-10-CM

## 2017-06-18 DIAGNOSIS — Z951 Presence of aortocoronary bypass graft: Secondary | ICD-10-CM

## 2017-06-18 DIAGNOSIS — I10 Essential (primary) hypertension: Secondary | ICD-10-CM

## 2017-06-18 DIAGNOSIS — B20 Human immunodeficiency virus [HIV] disease: Secondary | ICD-10-CM | POA: Diagnosis not present

## 2017-06-18 DIAGNOSIS — E785 Hyperlipidemia, unspecified: Secondary | ICD-10-CM | POA: Diagnosis not present

## 2017-06-18 DIAGNOSIS — Z87891 Personal history of nicotine dependence: Secondary | ICD-10-CM

## 2017-06-18 NOTE — Patient Instructions (Addendum)
Medication Instructions:   Please call the office of blood pressure runs high, We could a medication such as amlodipine  Labwork:  No new labs needed  Testing/Procedures:  No further testing at this time   Follow-Up: It was a pleasure seeing you in the office today. Please call us if you have new issues that need to be addressed before your next appt.  [phone]  Your physician wants you to follow-up in: 12 months.  You will receive a reminder letter in the mail two months in advance. If you don't receive a letter, please call our office to schedule the follow-up appointment.  If you need a refill on your cardiac medications before your next appointment, please call your pharmacy.

## 2017-08-07 DIAGNOSIS — D7589 Other specified diseases of blood and blood-forming organs: Secondary | ICD-10-CM | POA: Insufficient documentation

## 2017-08-14 ENCOUNTER — Other Ambulatory Visit: Payer: Medicare HMO

## 2017-08-14 DIAGNOSIS — B20 Human immunodeficiency virus [HIV] disease: Secondary | ICD-10-CM

## 2017-08-15 LAB — T-HELPER CELL (CD4) - (RCID CLINIC ONLY)
CD4 T CELL ABS: 600 /uL (ref 400–2700)
CD4 T CELL HELPER: 18 % — AB (ref 33–55)

## 2017-08-17 LAB — HIV-1 RNA QUANT-NO REFLEX-BLD
HIV 1 RNA Quant: <20 copies/mL — AB
HIV-1 RNA QUANT, LOG: DETECTED {Log_copies}/mL — AB

## 2017-08-29 ENCOUNTER — Encounter: Payer: Self-pay | Admitting: Internal Medicine

## 2017-08-29 ENCOUNTER — Ambulatory Visit (INDEPENDENT_AMBULATORY_CARE_PROVIDER_SITE_OTHER): Payer: Medicare HMO | Admitting: Internal Medicine

## 2017-08-29 DIAGNOSIS — B20 Human immunodeficiency virus [HIV] disease: Secondary | ICD-10-CM

## 2017-08-29 MED ORDER — ELVITEG-COBIC-EMTRICIT-TENOFAF 150-150-200-10 MG PO TABS: 1.0000 | ORAL_TABLET | Freq: Every day | ORAL | 11 refills | Status: DC

## 2017-08-29 NOTE — Assessment & Plan Note (Signed)
His infection is under excellent, long-term control.  He will continue Genvoya and follow-up after lab work in 1 year.

## 2017-08-29 NOTE — Progress Notes (Signed)
Patient Active Problem List   Diagnosis Date Noted  . Human immunodeficiency virus (HIV) disease ([employer]) 01/23/2012    Priority: High  . Smoking hx 12/18/2016  . Seasonal allergies 10/26/2016  . 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 Acute renal insufficiency 10/31/2012  . Other malaise and fatigue 10/29/2012  . Weight loss, unintentional 10/29/2012  . Dyslipidemia 02/08/2012  . Coronary artery disease 01/23/2012  . Seizure disorder ([place]) 01/23/2012    Patient's Medications  New Prescriptions   No medications on file  Previous Medications   ASPIRIN 81 MG TABLET    Take 81 mg by mouth daily.   ATORVASTATIN (LIPITOR) 20 MG TABLET    TAKE 1 TABLET BY MOUTH EVERY DAY   CHOLECALCIFEROL (VITAMIN D) 1000 UNITS TABLET    Take 1,000 Units by mouth daily.   LAMOTRIGINE (LAMICTAL) 100 MG TABLET    Take 100 mg by mouth 2 (two) times daily.   OMEGA-3 ACID ETHYL ESTERS (LOVAZA) 1 G CAPSULE    TAKE 1 CAPSULE BY MOUTH TWICE DAILY  Modified Medications   Modified Medication Previous Medication   ELVITEGRAVIR-COBICISTAT-EMTRICITABINE-TENOFOVIR (GENVOYA) 150-150-200-10 MG TABS TABLET GENVOYA 150-150-200-10 MG TABS tablet      Take 1 tablet by mouth daily with breakfast.    TAKE 1 TABLET BY MOUTH EVERY DAY [facility] BREAKFAST  Discontinued Medications   No medications on file    Subjective: [name] Kim in for his routine HIV follow-up visit.  He has had no problems obtaining, taking or tolerating his Genvoya.  He denies missing any doses.  He is feeling well and has no current concerns about his health.  Review of Systems: Review of Systems  Constitutional: Negative for fever and weight loss.  Respiratory: Positive for shortness of breath. Negative for cough and sputum production.        Sometimes he has shortness of breath when walking.  This improves with albuterol.  Cardiovascular: Negative for chest pain.    Gastrointestinal: Negative for abdominal pain, diarrhea, nausea and vomiting.    Past Medical History:  Diagnosis Date  . CAD (coronary artery disease)   . Chronic kidney disease   . HIV infection ([place])   . Hyperlipidemia   . Seizures ([place])    last one Oct 2003  . 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 Smoking status: Former Smoker    Types: Cigarettes    Last attempt to quit: 05/22/2004    Years since quitting: 13.2  . Smokeless tobacco: Never Used  Substance Use Topics  . Alcohol use: No    Alcohol/week: 0.0 oz    Comment: about 2 per week  . Drug use: No    Family History  Problem Relation Age of Onset  . Cancer Mother   . Cancer Father        leukemia    Allergies  Allergen Reactions  . Azt [Zidovudine] Other (See Comments)    Caused bone marrow problems  2000    Health Maintenance  Topic Date Due  . TETANUS/TDAP  03/28/1969  . COLONOSCOPY  03/28/2000  . PNA vac Low Risk Adult (1 of 2 - PCV13) 02/22/2016  . INFLUENZA VACCINE  12/20/2017  . Hepatitis C Screening  Completed    Objective:  Vitals:   08/29/17 1341  BP: (!) 149/72  Pulse: (!) 59  Temp: 98.6 F (37 C)  TempSrc: Oral  Weight: 141 lb (64 kg)   Body mass index is 22.08 kg/m.  Physical Exam  Constitutional: He is oriented to person, place, and time.  He is in good spirits.  He is accompanied by his significant other, Joey.  HENT:  Mouth/Throat: No oropharyngeal exudate.  Cardiovascular: Normal rate, regular rhythm and normal heart sounds.  No murmur heard. Pulmonary/Chest: Effort normal and breath sounds normal.  Neurological: He is alert and oriented to person, place, and time.  Skin: No rash noted.  Psychiatric: He has a normal mood and affect.    Lab Results Lab Results  Component Value Date   WBC 8.7 10/12/2016   HGB 15.9 10/12/2016   HCT 47.5 10/12/2016   MCV 101.3 (H) 10/12/2016   PLT 260 10/12/2016    Lab Results  Component  Value Date   CREATININE 1.24 10/12/2016   BUN 14 10/12/2016   NA 138 10/12/2016   K 5.0 10/12/2016   CL 103 10/12/2016   CO2 28 10/12/2016    Lab Results  Component Value Date   ALT 9 10/12/2016   AST 11 10/12/2016   ALKPHOS 73 10/12/2016   BILITOT 0.5 10/12/2016    Lab Results  Component Value Date   CHOL 133 10/12/2016   HDL 39 (L) 10/12/2016   LDLCALC 70 10/12/2016   LDLDIRECT 126.1 02/19/2013   TRIG 118 10/12/2016   CHOLHDL 3.4 10/12/2016   Lab Results  Component Value Date   LABRPR NON REAC 10/12/2016   HIV 1 RNA Quant (copies/mL)  Date Value  08/14/2017 <20 DETECTED (A)  10/12/2016 <20 NOT DETECTED  10/05/2015 <20   CD4 T Cell Abs (/uL)  Date Value  08/14/2017 600  10/12/2016 500  10/05/2015 580     Problem List Items Addressed This Visit      High   Human immunodeficiency virus (HIV) disease ([place]) (Chronic)    His infection is under excellent, long-term control.  He will continue Genvoya and follow-up after lab work in 1 year.      Relevant Medications   elvitegravir-cobicistat-emtricitabine-tenofovir (GENVOYA) 150-150-200-10 MG TABS tablet   Other Relevant Orders   CBC   T-helper cell (CD4)- ([facility] clinic only)   Comprehensive metabolic panel   Lipid panel   RPR   HIV 1 RNA quant-no reflex-bld        Michel Bickers, MD [facility] [phone] pager   [phone] cell 08/29/2017, 2:29 PM

## 2017-09-13 ENCOUNTER — Emergency Department
Admission: EM | Admit: 2017-09-13 | Discharge: 2017-09-13 | Disposition: A | Discharge status: Home / Self Care | Payer: Medicare HMO | Attending: Emergency Medicine | Admitting: Emergency Medicine

## 2017-09-13 ENCOUNTER — Emergency Department: Payer: Medicare HMO

## 2017-09-13 ENCOUNTER — Other Ambulatory Visit: Payer: Self-pay

## 2017-09-13 DIAGNOSIS — J0140 Acute pansinusitis, unspecified: Secondary | ICD-10-CM | POA: Diagnosis not present

## 2017-09-13 DIAGNOSIS — F17210 Nicotine dependence, cigarettes, uncomplicated: Secondary | ICD-10-CM | POA: Diagnosis not present

## 2017-09-13 DIAGNOSIS — I2510 Atherosclerotic heart disease of native coronary artery without angina pectoris: Secondary | ICD-10-CM | POA: Diagnosis not present

## 2017-09-13 DIAGNOSIS — Z79899 Other long term (current) drug therapy: Secondary | ICD-10-CM | POA: Insufficient documentation

## 2017-09-13 DIAGNOSIS — J209 Acute bronchitis, unspecified: Secondary | ICD-10-CM | POA: Insufficient documentation

## 2017-09-13 DIAGNOSIS — G501 Atypical facial pain: Secondary | ICD-10-CM | POA: Diagnosis present

## 2017-09-13 DIAGNOSIS — Z7982 Long term (current) use of aspirin: Secondary | ICD-10-CM | POA: Insufficient documentation

## 2017-09-13 DIAGNOSIS — J42 Unspecified chronic bronchitis: Secondary | ICD-10-CM

## 2017-09-13 DIAGNOSIS — N189 Chronic kidney disease, unspecified: Secondary | ICD-10-CM | POA: Insufficient documentation

## 2017-09-13 DIAGNOSIS — Z951 Presence of aortocoronary bypass graft: Secondary | ICD-10-CM | POA: Diagnosis not present

## 2017-09-13 MED ORDER — SULFAMETHOXAZOLE-TRIMETHOPRIM 800-160 MG PO TABS: 1.0000 | ORAL_TABLET | Freq: Two times a day (BID) | ORAL | 0 refills | Status: DC

## 2017-09-13 NOTE — Discharge Instructions (Addendum)
Follow-up with your primary care provider if any continued problems.  Begin taking Bactrim DS twice daily for 10 days.  Continue your regular medication including Spiriva and Ventolin.  Return to the emergency department if any worsening of your symptoms.  Also get your spirometer out and use daily to measure your output.

## 2017-09-13 NOTE — ED Notes (Signed)
Says feels like sinus problem for 2 days.  Was sent from urgent care.

## 2017-09-13 NOTE — ED Provider Notes (Signed)
[facility] Emergency Department Provider Note  ____________________________________________   First MD Initiated Contact with Patient 09/13/17 1408     (approximate)  I have reviewed the triage vital signs and the nursing notes.   HISTORY  Chief Complaint Facial Pain   HPI [name] Kim is a 67 y.o. male is here with complaint of facial pain however he was [facility] with a reported O2 sat of 87.  Patient states that he has had symptoms of upper respiratory/sinus infection.  He denies any shortness of breath or difficulty breathing.  Patient states that he rarely smokes but has smoked in the past.  He denies any fever or chills.  He denies productive cough.  Patient is HIV positive and positive history of COPD.  He states that his T-cell count remains steady in the upper 700s.  He denies any pain at this time.   Past Medical History:  Diagnosis Date  . CAD (coronary artery disease)   . Chronic kidney disease   . HIV infection ([place])   . Hyperlipidemia   . Seizures ([place])    last one Oct 2003  . Sleep behavior disorder, 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    Patient Active Problem List   Diagnosis Date Noted  . Smoking hx 12/18/2016  . Seasonal allergies 10/26/2016  . 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 Acute renal insufficiency 10/31/2012  . Other malaise and fatigue 10/29/2012  . Weight loss, unintentional 10/29/2012  . Dyslipidemia 02/08/2012  . Coronary artery disease 01/23/2012  . Seizure disorder ([place]) 01/23/2012  . Human immunodeficiency virus (HIV) disease ([place]) 01/23/2012    Past Surgical History:  Procedure Laterality Date  . APPENDECTOMY  1970  . CORONARY ARTERY BYPASS GRAFT  08-09-2005   LIMA-LAD, SVG-diagonal, SVG-OM  . TONSILECTOMY, ADENOIDECTOMY, BILATERAL MYRINGOTOMY AND TUBES  [phone]    Prior to Admission medications   Medication Sig Start Date  End Date Taking? Authorizing Provider  albuterol (PROVENTIL HFA;VENTOLIN HFA) 108 (90 Base) MCG/ACT inhaler Inhale into the lungs every 6 (six) hours as needed for wheezing or shortness of breath.   Yes [provider]  aspirin 81 MG tablet Take 81 mg by mouth daily.   Yes [provider]  atorvastatin (LIPITOR) 20 MG tablet TAKE 1 TABLET BY MOUTH EVERY DAY 01/29/17  Yes Gollan, Kathlene November, MD  cholecalciferol (VITAMIN D) 1000 units tablet Take 1,000 Units by mouth daily.   Yes [provider]  elvitegravir-cobicistat-emtricitabine-tenofovir (GENVOYA) 150-150-200-10 MG TABS tablet Take 1 tablet by mouth daily with breakfast. 08/29/17  Yes Michel Bickers, MD  lamoTRIgine (LAMICTAL) 100 MG tablet Take 100 mg by mouth 2 (two) times daily.   Yes [provider]  omega-3 acid ethyl esters (LOVAZA) 1 g capsule TAKE 1 CAPSULE BY MOUTH TWICE DAILY 12/11/16  Yes Gollan, Kathlene November, MD  sulfamethoxazole-trimethoprim (BACTRIM DS,SEPTRA DS) 800-160 MG tablet Take 1 tablet by mouth 2 (two) times daily. 09/13/17   Johnn Hai, PA-C    Allergies Azt [zidovudine]  Family History  Problem Relation Age of Onset  . Cancer Mother   . Cancer Father        leukemia    Social History Social History   Tobacco Use  . Smoking status: Current Every Day Smoker    Packs/day: 0.50    Types: Cigarettes    Last attempt to quit: 05/22/2004    Years since  quitting: 13.3  . Smokeless tobacco: Never Used  Substance Use Topics  . Alcohol use: Yes    Alcohol/week: 0.0 oz    Comment: about 2 per week  . Drug use: No    Review of Systems Constitutional: No fever/chills Eyes: No visual changes. ENT: No sore throat.  Positive for facial pain. Cardiovascular: Denies chest pain. Respiratory: Denies shortness of breath. Gastrointestinal: No abdominal pain.  No nausea, no vomiting. Musculoskeletal: Negative for back pain. Skin: Negative for rash. Neurological: Negative for  headaches, focal weakness or numbness. ____________________________________________   PHYSICAL EXAM:  VITAL SIGNS: ED Triage Vitals [09/13/17 1358]  Enc Vitals Group     BP (!) 157/71     Pulse Rate 65     Resp 15     Temp 98 F (36.7 C)     Temp Source Oral     SpO2 93 %     Weight 140 lb (63.5 kg)     Height 5\' 6"  (1.676 m)     Head Circumference      Peak Flow      Pain Score 0     Pain Loc      Pain Edu?      Excl. in [place]?    Constitutional: Alert and oriented. Well appearing and in no acute distress. Eyes: Conjunctivae are normal.  Head: Atraumatic. Nose: No congestion/rhinnorhea.  TMs are dull bilaterally.  There is diffuse mild tenderness on percussion of the frontal maxillary sinuses. Mouth/Throat: Mucous membranes are moist.  Oropharynx non-erythematous. Neck: No stridor.   Hematological/Lymphatic/Immunilogical: No cervical lymphadenopathy. Cardiovascular: Normal rate, regular rhythm. Grossly normal heart sounds.  Good peripheral circulation. Respiratory: Normal respiratory effort.  No retractions. Lungs faint expiratory wheeze heard in the left lung base.  Patient is able to speak in complete sentences without any difficulty or shortness of breath.  Patient also was walked around the flex area and O2 sat remained steady and constant at 90%.  Patient was talking the entire time he was walking and did not experience any difficulties.  Patient also remarked that he could walk faster if needed today. Musculoskeletal: Moves upper and lower extremities without any difficulty.  Normal gait was noted. Neurologic:  Normal speech and language. No gross focal neurologic deficits are appreciated. No gait instability. Skin:  Skin is warm, dry and intact. No rash noted. Psychiatric: Mood and affect are normal. Speech and behavior are normal.  ____________________________________________   LABS (all labs ordered are listed, but only abnormal results are displayed)  Labs Reviewed  - No data to display  RADIOLOGY  ED MD interpretation:   This x-ray shows chronic bronchitic changes.  Official radiology report(s): Dg Chest 2 View  Result Date: 09/13/2017 CLINICAL DATA:  Low O2 saturation, cough EXAM: CHEST - 2 VIEW COMPARISON:  CT chest 12/17/2007, radiograph 12/12/2007 FINDINGS: Post sternotomy changes. Hyperinflation. Mild diffuse bronchitic changes, increased compared to prior. No consolidation. No pleural effusion. Cardiomediastinal silhouette within normal limits with aortic atherosclerosis. IMPRESSION: Hyperinflation with diffuse probable acute on chronic bronchitic changes. No focal pulmonary infiltrate. Electronically Signed   By: Donavan Foil M.D.   On: 09/13/2017 14:54    ____________________________________________   PROCEDURES  Procedure(s) performed: None  Procedures  Critical Care performed: No  ____________________________________________   INITIAL IMPRESSION / ASSESSMENT AND PLAN / ED COURSE  As part of my medical decision making, I reviewed the following data within the electronic MEDICAL RECORD NUMBER Notes from prior ED visits and [place] Controlled Substance  Database  Patient is encouraged to continue Spiriva and use Ventolin daily as prescribed.  Patient was given prescription for Bactrim DS twice daily for 10 days.  He is to follow-up with his PCP if any continued problems and return to the emergency department if any severe worsening of his symptoms.  ____________________________________________   FINAL CLINICAL IMPRESSION(S) / ED DIAGNOSES  Final diagnoses:  Acute pansinusitis, recurrence not specified  Acute exacerbation of chronic bronchitis [facility])     ED Discharge Orders        Ordered    sulfamethoxazole-trimethoprim (BACTRIM DS,SEPTRA DS) 800-160 MG tablet  2 times daily     09/13/17 1536       Note:  This document was prepared using Dragon voice recognition software and may include unintentional dictation errors.      Johnn Hai, PA-C 09/13/17 1557    Carrie Mew, MD 09/18/17 1540

## 2017-09-13 NOTE — ED Triage Notes (Signed)
Pt was at [facility] and they reported that his O2 sat was 87% so they sent him to the ED - pt was there to be seen for "URI/sinus infection" - reports facial pressure - pt denies any other symptoms

## 2017-09-18 ENCOUNTER — Other Ambulatory Visit: Payer: Self-pay | Admitting: Family Medicine

## 2017-09-18 DIAGNOSIS — Z136 Encounter for screening for cardiovascular disorders: Secondary | ICD-10-CM

## 2017-09-20 ENCOUNTER — Ambulatory Visit
Admission: RE | Admit: 2017-09-20 | Discharge: 2017-09-20 | Disposition: A | Discharge status: Home / Self Care | Payer: Medicare HMO | Source: Ambulatory Visit | Attending: Family Medicine | Admitting: Family Medicine

## 2017-09-20 DIAGNOSIS — Z136 Encounter for screening for cardiovascular disorders: Secondary | ICD-10-CM

## 2017-10-30 ENCOUNTER — Ambulatory Visit: Payer: Medicare HMO | Admitting: Internal Medicine

## 2017-10-31 ENCOUNTER — Other Ambulatory Visit: Payer: Self-pay | Admitting: Internal Medicine

## 2017-10-31 DIAGNOSIS — B20 Human immunodeficiency virus [HIV] disease: Secondary | ICD-10-CM

## 2017-12-09 ENCOUNTER — Other Ambulatory Visit: Payer: Self-pay | Admitting: Cardiovascular Disease

## 2018-01-17 DIAGNOSIS — I714 Abdominal aortic aneurysm, without rupture, unspecified: Secondary | ICD-10-CM | POA: Insufficient documentation

## 2018-01-27 ENCOUNTER — Other Ambulatory Visit: Payer: Self-pay | Admitting: Cardiovascular Disease

## 2018-02-02 ENCOUNTER — Emergency Department
Admission: EM | Admit: 2018-02-02 | Discharge: 2018-02-02 | Disposition: A | Discharge status: Home / Self Care | Payer: Medicare HMO | Attending: Emergency Medicine | Admitting: Emergency Medicine

## 2018-02-02 ENCOUNTER — Other Ambulatory Visit: Payer: Self-pay

## 2018-02-02 DIAGNOSIS — Z21 Asymptomatic human immunodeficiency virus [HIV] infection status: Secondary | ICD-10-CM | POA: Diagnosis not present

## 2018-02-02 DIAGNOSIS — F17210 Nicotine dependence, cigarettes, uncomplicated: Secondary | ICD-10-CM | POA: Diagnosis not present

## 2018-02-02 DIAGNOSIS — Z79899 Other long term (current) drug therapy: Secondary | ICD-10-CM | POA: Diagnosis not present

## 2018-02-02 DIAGNOSIS — Z139 Encounter for screening, unspecified: Secondary | ICD-10-CM

## 2018-02-02 DIAGNOSIS — Z203 Contact with and (suspected) exposure to rabies: Secondary | ICD-10-CM | POA: Diagnosis present

## 2018-02-02 DIAGNOSIS — I2510 Atherosclerotic heart disease of native coronary artery without angina pectoris: Secondary | ICD-10-CM | POA: Diagnosis not present

## 2018-02-02 DIAGNOSIS — Z23 Encounter for immunization: Secondary | ICD-10-CM | POA: Insufficient documentation

## 2018-02-02 MED ORDER — AMOXICILLIN-POT CLAVULANATE 875-125 MG PO TABS
1.0000 | ORAL_TABLET | Freq: Once | ORAL | Status: AC
  Administered 2018-02-02: 1 via ORAL
  Filled 2018-02-02: qty 1

## 2018-02-02 MED ORDER — AMOXICILLIN-POT CLAVULANATE 875-125 MG PO TABS: 1.0000 | ORAL_TABLET | Freq: Two times a day (BID) | ORAL | 0 refills | Status: AC

## 2018-02-02 MED ORDER — TETANUS-DIPHTH-ACELL PERTUSSIS 5-2.5-18.5 LF-MCG/0.5 IM SUSP
0.5000 mL | Freq: Once | INTRAMUSCULAR | Status: AC
  Administered 2018-02-02: 0.5 mL via INTRAMUSCULAR
  Filled 2018-02-02: qty 0.5

## 2018-02-02 NOTE — ED Triage Notes (Signed)
Pt states that he is here because a bat landed on his arm earlier today, he is unsure if he was bitten. Denies pain but does report small acute scratch to anterior left arm.

## 2018-02-02 NOTE — ED Provider Notes (Signed)
[facility] Emergency Department Provider Note   ____________________________________________   First MD Initiated Contact with Patient 02/02/18 1212     (approximate)  I have reviewed the triage vital signs and the nursing notes.   HISTORY  Chief Complaint Other Printmaker)    HPI [name] Kim is a 67 y.o. male patient here for evaluation for rabies prophylaxis secondary to contact her back.  Patient state he was trying to hold out his attic when a bat flew and landed on his arm.  Patient did not feel any bite.  Patient state it might be a small abrasion on his right forearm.  Patient denies loss of sensation or loss of function of the left forearm.  Patient denies pain.  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    Past Medical History:  Diagnosis Date  . CAD (coronary artery disease)   . Chronic kidney disease   . HIV infection ([place])   . Hyperlipidemia   . Seizures ([place])    last one Oct 2003  . Sleep behavior disorder, 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    Patient Active Problem List   Diagnosis Date Noted  . Smoking hx 12/18/2016  . Seasonal allergies 10/26/2016  . 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 Acute renal insufficiency 10/31/2012  . Other malaise and fatigue 10/29/2012  . Weight loss, unintentional 10/29/2012  . Dyslipidemia 02/08/2012  . Coronary artery disease 01/23/2012  . Seizure disorder ([place]) 01/23/2012  . Human immunodeficiency virus (HIV) disease ([place]) 01/23/2012    Past Surgical History:  Procedure Laterality Date  . APPENDECTOMY  1970  . CORONARY ARTERY BYPASS GRAFT  08-09-2005   LIMA-LAD, SVG-diagonal, SVG-OM  . TONSILECTOMY, ADENOIDECTOMY, BILATERAL MYRINGOTOMY AND TUBES  [phone]    Prior to Admission medications   Medication Sig Start Date End Date Taking? Authorizing Provider  albuterol (PROVENTIL HFA;VENTOLIN HFA) 108 (90 Base) MCG/ACT  inhaler Inhale into the lungs every 6 (six) hours as needed for wheezing or shortness of breath.    [provider]  amoxicillin-clavulanate (AUGMENTIN) 875-125 MG tablet Take 1 tablet by mouth 2 (two) times daily for 10 days. 02/02/18 02/12/18  Sable Feil, PA-C  aspirin 81 MG tablet Take 81 mg by mouth daily.    [provider]  atorvastatin (LIPITOR) 20 MG tablet TAKE 1 TABLET BY MOUTH EVERY DAY 01/29/17   Minna Merritts, MD  atorvastatin (LIPITOR) 20 MG tablet TAKE 1 TABLET(20 MG) BY MOUTH DAILY 01/28/18   Minna Merritts, MD  cholecalciferol (VITAMIN D) 1000 units tablet Take 1,000 Units by mouth daily.    [provider]  elvitegravir-cobicistat-emtricitabine-tenofovir (GENVOYA) 150-150-200-10 MG TABS tablet Take 1 tablet by mouth daily with breakfast. 08/29/17   Michel Bickers, MD  GENVOYA 150-150-200-10 MG TABS tablet TAKE 1 TABLET BY MOUTH EVERY DAY [facility] BREAKFAST 10/31/17   Michel Bickers, MD  lamoTRIgine (LAMICTAL) 100 MG tablet Take 100 mg by mouth 2 (two) times daily.    [provider]  omega-3 acid ethyl esters (LOVAZA) 1 g capsule TAKE 1 CAPSULE BY MOUTH TWICE DAILY 12/10/17   Minna Merritts, MD  sulfamethoxazole-trimethoprim (BACTRIM DS,SEPTRA DS) 800-160 MG tablet Take 1 tablet by mouth 2 (two) times daily. 09/13/17   Johnn Hai, PA-C    Allergies Azt [zidovudine]  Family History  Problem Relation Age of Onset  . Cancer Mother   . Cancer Father  leukemia    Social History Social History   Tobacco Use  . Smoking status: Current Every Day Smoker    Packs/day: 0.50    Types: Cigarettes    Last attempt to quit: 05/22/2004    Years since quitting: 13.7  . Smokeless tobacco: Never Used  Substance Use Topics  . Alcohol use: Yes    Alcohol/week: 0.0 standard drinks    Comment: about 2 per week  . Drug use: No    Review of Systems Constitutional: No fever/chills Eyes: No visual changes. ENT: No sore  throat. Cardiovascular: Denies chest pain. Respiratory: Denies shortness of breath. Gastrointestinal: No abdominal pain.  No nausea, no vomiting.  No diarrhea.  No constipation. Genitourinary: Negative for dysuria. Musculoskeletal: Negative for back pain. Skin: Negative for rash. Neurological: Negative for headaches, focal weakness or numbness. Endocrine:Hyperlipidemia and chronic kidney disease. Hematological/Lymphatic: Allergic/Immunilogical: HIV ____________________________________________   PHYSICAL EXAM:  VITAL SIGNS: ED Triage Vitals [02/02/18 1207]  Enc Vitals Group     BP (!) 157/87     Pulse Rate (!) 58     Resp 18     Temp 98.7 F (37.1 C)     Temp Source Oral     SpO2 93 %     Weight 140 lb (63.5 kg)     Height 5\' 6"  (1.676 m)     Head Circumference      Peak Flow      Pain Score 0     Pain Loc      Pain Edu?      Excl. in [place]?     Constitutional: Alert and oriented. Well appearing and in no acute distress. Hematological/Lymphatic/Immunilogical: No cervical lymphadenopathy. Cardiovascular: Normal rate, regular rhythm. Grossly normal heart sounds.  Good peripheral circulation.  Elevated systolic blood pressure Respiratory: Normal respiratory effort.  No retractions. Lungs CTAB. Neurologic:  Normal speech and language. No gross focal neurologic deficits are appreciated. No gait instability. Skin:  Skin is warm, dry and intact. No rash noted. Psychiatric: Mood and affect are normal. Speech and behavior are normal.  ____________________________________________   LABS (all labs ordered are listed, but only abnormal results are displayed)  Labs Reviewed - No data to display ____________________________________________  EKG   ____________________________________________  RADIOLOGY  ED MD interpretation:    Official radiology report(s): No results found.  ____________________________________________   PROCEDURES  Procedure(s) performed:  None  Procedures  Critical Care performed: No  ____________________________________________   INITIAL IMPRESSION / ASSESSMENT AND PLAN / ED COURSE  As part of my medical decision making, I reviewed the following data within the [place]    Patient presents for evaluation secondary to contact with a bat.  Point of contact shows no broken skin.  Discussed with patient rationale for not starting rabies prophylactics.  Patient is agreeable.  Patient given discharge care instruction follow-up PCP as needed.   ____________________________________________   FINAL CLINICAL IMPRESSION(S) / ED DIAGNOSES  Final diagnoses:  Encounter for medical screening examination     ED Discharge Orders         Ordered    amoxicillin-clavulanate (AUGMENTIN) 875-125 MG tablet  2 times daily     02/02/18 1228           Note:  This document was prepared using Dragon voice recognition software and may include unintentional dictation errors.    Sable Feil, PA-C 02/02/18 1234    Harvest Dark, MD 02/03/18 1755

## 2018-02-02 NOTE — Discharge Instructions (Addendum)
Your contact with a bat did not produce any puncture wound.  You are low risk for rabies.  Advised to monitor the area and return back if condition worsens.  Take Augmentin as prophylactic.

## 2018-02-02 NOTE — ED Triage Notes (Signed)
FIRST NURSE NOTE-here for rabies vaccines. Bat flew into his arm when trying to hose out of attic

## 2018-03-10 ENCOUNTER — Other Ambulatory Visit: Payer: Self-pay | Admitting: Cardiovascular Disease

## 2018-06-20 ENCOUNTER — Encounter: Payer: Self-pay | Admitting: Cardiovascular Disease

## 2018-06-20 ENCOUNTER — Ambulatory Visit (INDEPENDENT_AMBULATORY_CARE_PROVIDER_SITE_OTHER): Payer: Medicare HMO | Admitting: Cardiovascular Disease

## 2018-06-20 VITALS — BP 135/70 | HR 55 | Ht 67.0 in | Wt 147.0 lb

## 2018-06-20 DIAGNOSIS — Z951 Presence of aortocoronary bypass graft: Secondary | ICD-10-CM | POA: Diagnosis not present

## 2018-06-20 DIAGNOSIS — I25118 Atherosclerotic heart disease of native coronary artery with other forms of angina pectoris: Secondary | ICD-10-CM

## 2018-06-20 DIAGNOSIS — E785 Hyperlipidemia, unspecified: Secondary | ICD-10-CM

## 2018-06-20 DIAGNOSIS — Z87891 Personal history of nicotine dependence: Secondary | ICD-10-CM | POA: Diagnosis not present

## 2018-06-20 DIAGNOSIS — B20 Human immunodeficiency virus [HIV] disease: Secondary | ICD-10-CM

## 2018-06-20 DIAGNOSIS — I10 Essential (primary) hypertension: Secondary | ICD-10-CM

## 2018-06-20 NOTE — Patient Instructions (Signed)

## 2018-06-20 NOTE — Progress Notes (Signed)
Cardiology Office Note  Date:  06/20/2018   ID:  [name] Kim, [name] Kim Jul 14, 1950, MRN [id number]  PCP:  [name] Limbo, MD   Chief Complaint  Patient presents with  . other    12 month f/u no complaints today. Meds reviewed verbally with pt.    HPI:  [name] is a pleasant 68 year-old gentleman with a history of  coronary artery disease,  bypass surgery in 2007,  chronic HIV infection on antibiotic therapy, Long history of smoking,  mild COPD Prior stress test in 2012 showing no ischemia  Small AAA who presents for routine follow-up of his coronary artery disease, CABG  In follow-up today he reports having abdominal aortic ultrasound showing small AAA dated 09/2017 Distal abdominal aorta measuring 3.6 cm in greatest dimension.  Blood pressure at home is been well controlled Typically pressure 130s/70  Recently having sinus and chest secretions Some shortness of breath which goes away after clearing the secretions  Reports he is active, walks on the golf [place] Has a membership to the [facility] but does not go No chest pain symptoms on exertion  Spiriva, refills Sometimes take it  Labs reviewed Total chol 133, LDL 70  Weight is up several pounds at his doing, was trying to gain weight  EKG personally reviewed by myself on todays visit Shows normal sinus rhythm rate 55 bpm no significant ST-T wave changes  Other past medical history [place], can walk the 2.5 mile loop Previously reported symptoms concerning for Raynaud's, not an issue on today's visit   PMH:   has a past medical history of AAA (abdominal aortic aneurysm) ([place]), CAD (coronary artery disease), Chronic kidney disease, HIV infection ([place]), Hyperlipidemia, Seizures ([place]), and Sleep behavior disorder, 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  PSH:    Past Surgical History:  Procedure Laterality Date  . APPENDECTOMY  1970  . CORONARY ARTERY BYPASS GRAFT  08-09-2005   LIMA-LAD, SVG-diagonal, SVG-OM  . TONSILECTOMY,  ADENOIDECTOMY, BILATERAL MYRINGOTOMY AND TUBES  [phone]    Current Outpatient Medications  Medication Sig Dispense Refill  . albuterol (PROVENTIL HFA;VENTOLIN HFA) 108 (90 Base) MCG/ACT inhaler Inhale into the lungs every 6 (six) hours as needed for wheezing or shortness of breath.    [name] aspirin 81 MG tablet Take 81 mg by mouth daily.    [name] atorvastatin (LIPITOR) 20 MG tablet TAKE 1 TABLET(20 MG) BY MOUTH DAILY 90 tablet 2  . cholecalciferol (VITAMIN D) 1000 units tablet Take 1,000 Units by mouth daily.    [name] elvitegravir-cobicistat-emtricitabine-tenofovir (GENVOYA) 150-150-200-10 MG TABS tablet Take 1 tablet by mouth daily with breakfast. 30 tablet 11  . lamoTRIgine (LAMICTAL) 100 MG tablet Take 100 mg by mouth 2 (two) times daily.    [name] omega-3 acid ethyl esters (LOVAZA) 1 g capsule TAKE 1 CAPSULE BY MOUTH TWICE DAILY 180 capsule 3   Current Facility-Administered Medications  Medication Dose Route Frequency Provider Last Rate Last Dose  . fexofenadine (ALLEGRA) tablet 180 mg  180 mg Oral Daily [name] Bickers, MD         Allergies:   Azt [zidovudine]   Social History:  The patient  reports that he has been smoking cigarettes. He has been smoking about 0.50 packs per day. He has never used smokeless tobacco. He reports current alcohol use. He reports that he does not use drugs.   Family History:   family history includes Cancer in his father and mother.    Review of Systems: Review of Systems  Constitutional:  Negative.   Respiratory: Negative.   Cardiovascular: Negative.   Gastrointestinal: Negative.   Musculoskeletal: Negative.   Neurological: Negative.   Psychiatric/Behavioral: Negative.   All other systems reviewed and are negative.   PHYSICAL EXAM: VS:  BP 135/70 (BP Location: Left Arm, Patient Position: Sitting, Cuff Size: Normal)   Pulse (!) 55   Ht 5\' 7"  (1.702 m)   Wt 147 lb (66.7 kg)   BMI 23.02 kg/m  , BMI Body mass index is 23.02 kg/m. Constitutional:  oriented to  person, place, and time. No distress.  HENT:  Head: Grossly normal Eyes:  no discharge. No scleral icterus.  Neck: No JVD, no carotid bruits  Cardiovascular: Regular rate and rhythm, no murmurs appreciated Pulmonary/Chest: Clear to auscultation bilaterally, no wheezes or rails Abdominal: Soft.  no distension.  no tenderness.  Musculoskeletal: Normal range of motion Neurological:  normal muscle tone. Coordination normal. No atrophy Skin: Skin warm and dry Psychiatric: normal affect, pleasant   Recent Labs: No results found for requested labs within last 8760 hours.    Lipid Panel Lab Results  Component Value Date   CHOL 133 10/12/2016   HDL 39 (L) 10/12/2016   LDLCALC 70 10/12/2016   TRIG 118 10/12/2016      Wt Readings from Last 3 Encounters:  06/19/16 146 lb 4 oz (66.3 kg)  heavy stuff on today 143 at home  12/20/15 139 lb 8 oz (63.3 kg)  11/11/15 139 lb (63 kg)    Filed Weights   06/20/18 0943  Weight: 147 lb (66.7 kg)      ASSESSMENT AND PLAN:  Coronary artery disease involving native coronary artery of native heart without angina pectoris - Plan: EKG 12-Lead Denies any anginal symptoms, no further ischemic work-up at this time Discussed anginal symptoms to watch for  Human immunodeficiency virus (HIV) disease ([place]) Managed by ID Reports he is scheduled for follow-up, and will have labs at that time  Dyslipidemia Numbers at goal, no medication changes made  S/P CABG (coronary artery bypass graft) Overall doing well, recommended continued aggressive lipid management  Essential hypertension Blood pressure initially elevated but this improved on my recheck down to 612 systolic   Total encounter time more than 25 minutes  Greater than 50% was spent in counseling and coordination of care with the patient   Disposition:   F/U  12 months   Orders Placed This Encounter  Procedures  . EKG 12-Lead     Signed, [name] Kim, M.D., Ph.D. 06/20/2018   [place], [place]

## 2018-08-22 ENCOUNTER — Other Ambulatory Visit: Payer: Self-pay

## 2018-08-22 ENCOUNTER — Other Ambulatory Visit: Payer: Medicare HMO

## 2018-08-22 DIAGNOSIS — Z79899 Other long term (current) drug therapy: Secondary | ICD-10-CM

## 2018-08-22 DIAGNOSIS — Z113 Encounter for screening for infections with a predominantly sexual mode of transmission: Secondary | ICD-10-CM

## 2018-08-22 DIAGNOSIS — B20 Human immunodeficiency virus [HIV] disease: Secondary | ICD-10-CM

## 2018-08-23 LAB — T-HELPER CELL (CD4) - (RCID CLINIC ONLY)
CD4 % Helper T Cell: 19 % — ABNORMAL LOW (ref 33–55)
CD4 T Cell Abs: 450 /uL (ref 400–2700)

## 2018-08-29 ENCOUNTER — Other Ambulatory Visit: Payer: Self-pay | Admitting: Internal Medicine

## 2018-08-29 DIAGNOSIS — B20 Human immunodeficiency virus [HIV] disease: Secondary | ICD-10-CM

## 2018-08-30 LAB — LIPID PANEL
Cholesterol: 156 mg/dL (ref <200)
HDL: 41 mg/dL (ref >=40)
LDL Cholesterol (Calc): 84 mg/dL (calc)
Non-HDL Cholesterol (Calc): 115 mg/dL (calc) (ref <130)
Total CHOL/HDL Ratio: 3.8 (calc) (ref <5.0)
Triglycerides: 224 mg/dL — ABNORMAL HIGH (ref <150)

## 2018-08-30 LAB — HIV-1 RNA QUANT-NO REFLEX-BLD
HIV 1 RNA Quant: <20 copies/mL
HIV-1 RNA Quant, Log: <1.3 Log copies/mL

## 2018-08-30 LAB — CBC
HCT: 51.5 % — ABNORMAL HIGH (ref 38.5–50.0)
Hemoglobin: 18.5 g/dL — ABNORMAL HIGH (ref 13.2–17.1)
MCH: 36.2 pg — ABNORMAL HIGH (ref 27.0–33.0)
MCHC: 35.9 g/dL (ref 32.0–36.0)
MCV: 100.8 fL — ABNORMAL HIGH (ref 80.0–100.0)
MPV: 10.2 fL (ref 7.5–12.5)
Platelets: 251 10*3/uL (ref 140–400)
RBC: 5.11 10*6/uL (ref 4.20–5.80)
RDW: 11.9 % (ref 11.0–15.0)
WBC: 9.4 10*3/uL (ref 3.8–10.8)

## 2018-08-30 LAB — COMPREHENSIVE METABOLIC PANEL
AG Ratio: 1.8 (calc) (ref 1.0–2.5)
ALT: 16 U/L (ref 9–46)
AST: 15 U/L (ref 10–35)
Albumin: 4.9 g/dL (ref 3.6–5.1)
Alkaline phosphatase (APISO): 68 U/L (ref 35–144)
BUN/Creatinine Ratio: 13 (calc) (ref 6–22)
BUN: 17 mg/dL (ref 7–25)
CO2: 27 mmol/L (ref 20–32)
Calcium: 10.6 mg/dL — ABNORMAL HIGH (ref 8.6–10.3)
Chloride: 102 mmol/L (ref 98–110)
Creat: 1.32 mg/dL — ABNORMAL HIGH (ref 0.70–1.25)
Globulin: 2.7 g/dL (calc) (ref 1.9–3.7)
Glucose, Bld: 100 mg/dL — ABNORMAL HIGH (ref 65–99)
Potassium: 5.1 mmol/L (ref 3.5–5.3)
Sodium: 138 mmol/L (ref 135–146)
Total Bilirubin: 0.6 mg/dL (ref 0.2–1.2)
Total Protein: 7.6 g/dL (ref 6.1–8.1)

## 2018-08-30 LAB — RPR: RPR Ser Ql: NONREACTIVE

## 2018-09-05 ENCOUNTER — Ambulatory Visit: Payer: Medicare HMO | Admitting: Internal Medicine

## 2018-09-24 ENCOUNTER — Other Ambulatory Visit: Payer: Self-pay

## 2018-09-24 ENCOUNTER — Encounter: Payer: Self-pay | Admitting: Internal Medicine

## 2018-09-24 ENCOUNTER — Ambulatory Visit (INDEPENDENT_AMBULATORY_CARE_PROVIDER_SITE_OTHER): Payer: Medicare HMO | Admitting: Internal Medicine

## 2018-09-24 DIAGNOSIS — B20 Human immunodeficiency virus [HIV] disease: Secondary | ICD-10-CM

## 2018-09-24 NOTE — Progress Notes (Signed)
Virtual Visit via Telephone Note  I connected with [name] Kim on 09/24/18 at 10:00 AM EDT by telephone and verified that I am speaking with the correct person using two identifiers.  Location: Patient: Home Provider: [place] [facility] for Infectious Disease   I discussed the limitations, risks, security and privacy concerns of performing an evaluation and management service by telephone and the availability of in person appointments. I also discussed with the patient that there may be a patient responsible charge related to this service. The patient expressed understanding and agreed to proceed.   History of Present Illness: I called and spoke with [name] Kim by phone today.   He has not had any problems obtaining, taking or tolerating his Genvoya and never misses a single dose.  He is feeling well.  Observations/Objective: Lab Results  Component Value Date   WBC 9.4 08/22/2018   HGB 18.5 (H) 08/22/2018   HCT 51.5 (H) 08/22/2018   MCV 100.8 (H) 08/22/2018   PLT 251 08/22/2018    Lab Results  Component Value Date   CREATININE 1.32 (H) 08/22/2018   BUN 17 08/22/2018   NA 138 08/22/2018   K 5.1 08/22/2018   CL 102 08/22/2018   CO2 27 08/22/2018    Lab Results  Component Value Date   ALT 16 08/22/2018   AST 15 08/22/2018   ALKPHOS 73 10/12/2016   BILITOT 0.6 08/22/2018    Lab Results  Component Value Date   CHOL 156 08/22/2018   HDL 41 08/22/2018   LDLCALC 84 08/22/2018   LDLDIRECT 126.1 02/19/2013   TRIG 224 (H) 08/22/2018   CHOLHDL 3.8 08/22/2018   HIV 1 RNA Quant (copies/mL)  Date Value  08/22/2018 <20 NOT DETECTED  08/14/2017 <20 DETECTED (A)  10/12/2016 <20 NOT DETECTED   CD4 T Cell Abs (/uL)  Date Value  08/22/2018 450  08/14/2017 600  10/12/2016 500   No results found for: HIV1GENOSEQ Lab Results  Component Value Date   HAV TEST NOT PERFORMED 01/16/2012   HAV NEG 01/16/2012   Lab Results  Component Value Date   HEPBSAG TEST NOT PERFORMED 01/16/2012    HEPBSAG NEGATIVE 01/16/2012   HEPBSAB TEST NOT PERFORMED 01/16/2012   HEPBSAB POS (A) 01/16/2012   Lab Results  Component Value Date   HCVAB TEST NOT PERFORMED 01/16/2012   HCVAB NEGATIVE 01/16/2012   No results found for: CHLAMYDIAWP, N No results found for: GCPROBEAPT No results found for: QUANTGOLD   Assessment and Plan: His HIV infection remains under excellent, long-term control.  Follow Up Instructions: He will continue Genvoya and follow-up after lab work in 6 months   I discussed the assessment and treatment plan with the patient. The patient was provided an opportunity to ask questions and all were answered. The patient agreed with the plan and demonstrated an understanding of the instructions.   The patient was advised to call back or seek an in-person evaluation if the symptoms worsen or if the condition fails to improve as anticipated.  I provided 8 minutes of non-face-to-face time during this encounter.   Michel Bickers, MD

## 2018-09-28 ENCOUNTER — Other Ambulatory Visit: Payer: Self-pay | Admitting: Internal Medicine

## 2018-09-28 DIAGNOSIS — B20 Human immunodeficiency virus [HIV] disease: Secondary | ICD-10-CM

## 2018-10-28 ENCOUNTER — Other Ambulatory Visit: Payer: Self-pay | Admitting: Cardiovascular Disease

## 2018-11-07 ENCOUNTER — Encounter: Payer: Self-pay | Admitting: Internal Medicine

## 2018-11-13 ENCOUNTER — Encounter: Payer: Self-pay | Admitting: Internal Medicine

## 2018-11-13 DIAGNOSIS — D126 Benign neoplasm of colon, unspecified: Secondary | ICD-10-CM | POA: Insufficient documentation

## 2018-11-25 ENCOUNTER — Other Ambulatory Visit: Payer: Self-pay | Admitting: Internal Medicine

## 2018-11-25 DIAGNOSIS — B20 Human immunodeficiency virus [HIV] disease: Secondary | ICD-10-CM

## 2019-03-02 ENCOUNTER — Other Ambulatory Visit: Payer: Self-pay | Admitting: Cardiovascular Disease

## 2019-03-11 ENCOUNTER — Other Ambulatory Visit: Payer: Medicare HMO

## 2019-03-11 ENCOUNTER — Other Ambulatory Visit: Payer: Self-pay

## 2019-03-11 DIAGNOSIS — B20 Human immunodeficiency virus [HIV] disease: Secondary | ICD-10-CM

## 2019-03-12 LAB — T-HELPER CELL (CD4) - (RCID CLINIC ONLY)
CD4 % Helper T Cell: 20 % — ABNORMAL LOW (ref 33–65)
CD4 T Cell Abs: 563 /uL (ref 400–1790)

## 2019-03-14 LAB — HIV-1 RNA QUANT-NO REFLEX-BLD
HIV 1 RNA Quant: <20 copies/mL
HIV-1 RNA Quant, Log: <1.3 Log copies/mL

## 2019-03-27 ENCOUNTER — Other Ambulatory Visit: Payer: Self-pay

## 2019-03-27 ENCOUNTER — Ambulatory Visit (INDEPENDENT_AMBULATORY_CARE_PROVIDER_SITE_OTHER): Payer: Medicare HMO | Admitting: Internal Medicine

## 2019-03-27 DIAGNOSIS — B20 Human immunodeficiency virus [HIV] disease: Secondary | ICD-10-CM

## 2019-03-27 NOTE — Progress Notes (Signed)
Patient Active Problem List   Diagnosis Date Noted  . Human immunodeficiency virus (HIV) disease ([place]) 01/23/2012    Priority: High  . Tubular adenoma of colon 11/13/2018  . Smoking hx 12/18/2016  . Seasonal allergies 10/26/2016  . 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 Acute renal insufficiency 10/31/2012  . Other malaise and fatigue 10/29/2012  . Weight loss, unintentional 10/29/2012  . Dyslipidemia 02/08/2012  . Coronary artery disease 01/23/2012  . Seizure disorder ([place]) 01/23/2012    Patient's Medications  New Prescriptions   No medications on file  Previous Medications   ALBUTEROL (PROVENTIL HFA;VENTOLIN HFA) 108 (90 BASE) MCG/ACT INHALER    Inhale into the lungs every 6 (six) hours as needed for wheezing or shortness of breath.   ANORO ELLIPTA 62.5-25 MCG/INH AEPB       ASPIRIN 81 MG TABLET    Take 81 mg by mouth daily.   ATORVASTATIN (LIPITOR) 20 MG TABLET    TAKE 1 TABLET(20 MG) BY MOUTH DAILY   CHOLECALCIFEROL (VITAMIN D) 1000 UNITS TABLET    Take 1,000 Units by mouth daily.   CYCLOBENZAPRINE (FLEXERIL) 5 MG TABLET       GENVOYA 150-150-200-10 MG TABS TABLET    TAKE 1 TABLET BY MOUTH EVERY DAY WTH BREAKFAST   LAMOTRIGINE (LAMICTAL) 100 MG TABLET    Take 100 mg by mouth 2 (two) times daily.   MELOXICAM (MOBIC) 15 MG TABLET       OMEGA-3 ACID ETHYL ESTERS (LOVAZA) 1 G CAPSULE    TAKE 1 CAPSULE BY MOUTH TWICE DAILY  Modified Medications   No medications on file  Discontinued Medications   No medications on file    Subjective: [name] Kim is in for his routine HIV follow-up visit.  He has had no problems obtaining, taking or tolerating his Genvoya and never misses a single dose.  He takes it each morning after breakfast.  Routine colonoscopy few months ago when 3 small polyps were removed moved.  He has a cataract in his left eye but it has putting off surgery until the Covid  pandemic subsides.  Otherwise he is feeling well.  He has already received his influenza vaccine.  Review of Systems: Review of Systems  Constitutional: Negative for fever.  Respiratory: Negative for cough and shortness of breath.   Cardiovascular: Negative for chest pain.  Psychiatric/Behavioral: Negative for depression.    Past Medical History:  Diagnosis Date  . AAA (abdominal aortic aneurysm) ([place])   . CAD (coronary artery disease)   . Chronic kidney disease   . HIV infection ([place])   . Hyperlipidemia   . Seizures ([place])    last one Oct 2003  . 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 Smoking status: Current Every Day Smoker    Packs/day: 0.50    Types: Cigarettes    Last attempt to quit: 05/22/2004    Years since quitting: 14.8  . Smokeless tobacco: Never Used  Substance Use Topics  . Alcohol use: Yes    Alcohol/week: 0.0 standard drinks    Comment: about 2 per week  . Drug use: No    Family History  Problem Relation Age of Onset  . Cancer Mother   . Cancer Father        leukemia    Allergies  Allergen Reactions  .  Azt [Zidovudine] Other (See Comments)    Caused bone marrow problems  2000    Health Maintenance  Topic Date Due  . COLONOSCOPY  03/28/2000  . INFLUENZA VACCINE  12/21/2018  . TETANUS/TDAP  02/03/2028  . Hepatitis C Screening  Completed  . PNA vac Low Risk Adult  Completed    Objective:  Vitals:   03/27/19 1001  BP: 128/75  Pulse: (!) 57  Weight: 144 lb (65.3 kg)   Body mass index is 22.55 kg/m.  Physical Exam Constitutional:      Comments: He is in good spirits.  Cardiovascular:     Rate and Rhythm: Normal rate and regular rhythm.     Heart sounds: No murmur.  Pulmonary:     Effort: Pulmonary effort is normal.     Breath sounds: Normal breath sounds.  Psychiatric:        Mood and Affect: Mood normal.     Lab Results Lab Results  Component Value Date   WBC 9.4 08/22/2018   HGB 18.5  (H) 08/22/2018   HCT 51.5 (H) 08/22/2018   MCV 100.8 (H) 08/22/2018   PLT 251 08/22/2018    Lab Results  Component Value Date   CREATININE 1.32 (H) 08/22/2018   BUN 17 08/22/2018   NA 138 08/22/2018   K 5.1 08/22/2018   CL 102 08/22/2018   CO2 27 08/22/2018    Lab Results  Component Value Date   ALT 16 08/22/2018   AST 15 08/22/2018   ALKPHOS 73 10/12/2016   BILITOT 0.6 08/22/2018    Lab Results  Component Value Date   CHOL 156 08/22/2018   HDL 41 08/22/2018   LDLCALC 84 08/22/2018   LDLDIRECT 126.1 02/19/2013   TRIG 224 (H) 08/22/2018   CHOLHDL 3.8 08/22/2018   Lab Results  Component Value Date   LABRPR NON-REACTIVE 08/22/2018   HIV 1 RNA Quant (copies/mL)  Date Value  03/11/2019 <20 NOT DETECTED  08/22/2018 <20 NOT DETECTED  08/14/2017 <20 DETECTED (A)   CD4 T Cell Abs (/uL)  Date Value  03/11/2019 563  08/22/2018 450  08/14/2017 600     Problem List Items Addressed This Visit      High   Human immunodeficiency virus (HIV) disease ([place]) (Chronic)    His infection remains under excellent, long-term control.  He will continue Genvoya and follow-up after lab work in 1 year.      Relevant Orders   1 Year CBC   1 Year CD4   1 Year CMP   1 Year Lipid panel   1 Year RPR   1 Year VL        Michel Bickers, MD [facility] for Infectious [place] [phone] pager   [phone] cell 03/27/2019, 10:50 AM

## 2019-03-27 NOTE — Assessment & Plan Note (Signed)
His infection remains under excellent, long-term control.  He will continue Genvoya and follow-up after lab work in 1 year.

## 2019-06-14 ENCOUNTER — Other Ambulatory Visit: Payer: Self-pay | Admitting: Cardiovascular Disease

## 2019-06-20 NOTE — Progress Notes (Signed)
Cardiology Office Note  Date:  06/23/2019   ID:  Aldric, Wenzler [birth year], MRN [id number]  PCP:  Bernerd Limbo, MD   Chief Complaint  Patient presents with  . other    12 month f/u no complaints today. Meds reviewed verbally with pt.    HPI:  Mr. Decicco is a pleasant 69 year-old gentleman with a history of  coronary artery disease,  bypass surgery in 2007,  chronic HIV infection on antibiotic therapy, Long history of smoking,  mild COPD Prior stress test in 2012 showing no ischemia  Small AAA who presents for routine follow-up of his coronary artery disease, CABG  Blood pressure at home: 916 systolic  Doing well,  Feels well, no angina sx Weight stable No regular exercise program  When the weather is good, walks on the golf course Has a membership to the [facility] but does not go No chest pain symptoms on exertion  Labs reviewed Total chol 156, LDL 84 CR 1.32 HBG 18.5  EKG personally reviewed by myself on todays visit Sinus bradycardia rate 56 bpm  Other past medical hx reviewed  abdominal aortic ultrasound showing small AAA dated 09/2017 Distal abdominal aorta measuring 3.6 cm in greatest dimension.  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  PSH:    Past Surgical History:  Procedure Laterality Date  . APPENDECTOMY  1970  . CORONARY ARTERY BYPASS GRAFT  08-09-2005   LIMA-LAD, SVG-diagonal, SVG-OM  . TONSILECTOMY, ADENOIDECTOMY, BILATERAL MYRINGOTOMY AND TUBES  [phone]    Current Outpatient Medications  Medication Sig Dispense Refill  . albuterol (PROVENTIL HFA;VENTOLIN HFA) 108 (90 Base) MCG/ACT inhaler Inhale into the lungs every 6  (six) hours as needed for wheezing or shortness of breath.    Jearl Klinefelter ELLIPTA 62.5-25 MCG/INH AEPB 1 puff as needed.     [name] aspirin 81 MG tablet Take 81 mg by mouth daily.    [name] atorvastatin (LIPITOR) 20 MG tablet Take 1 tablet (20 mg total) by mouth daily at 6 PM. 90 tablet 3  . cholecalciferol (VITAMIN D) 1000 units tablet Take 1,000 Units by mouth daily.    . GENVOYA 150-150-200-10 MG TABS tablet TAKE 1 TABLET BY MOUTH EVERY DAY WTH BREAKFAST 30 tablet 4  . lamoTRIgine (LAMICTAL) 100 MG tablet Take 100 mg by mouth daily.     [name] omega-3 acid ethyl esters (LOVAZA) 1 g capsule Take 1 capsule (1 g total) by mouth 2 (two) times daily. 180 capsule 3   Current Facility-Administered Medications  Medication Dose Route Frequency Provider Last Rate Last Admin  . fexofenadine (ALLEGRA) tablet 180 mg  180 mg Oral Daily Michel Bickers, MD         Allergies:   Azt [zidovudine]   Social History:  The patient  reports that he has been smoking cigarettes. He has been smoking about 0.50 packs per day. He has never used smokeless tobacco. He reports current alcohol use. He reports that he does not use drugs.   Family History:   family history includes Cancer in his father and mother.    Review of Systems: Review of Systems  Constitutional: Negative.   Respiratory: Negative.   Cardiovascular: Negative.   Gastrointestinal:  Negative.   Musculoskeletal: Negative.   Neurological: Negative.   Psychiatric/Behavioral: Negative.   All other systems reviewed and are negative.   PHYSICAL EXAM: VS:  BP 140/70 (BP Location: Left Arm, Patient Position: Sitting, Cuff Size: Normal)   Pulse (!) 56   Ht 5\' 7"  (1.702 m)   Wt 146 lb (66.2 kg)   SpO2 97%   BMI 22.87 kg/m  , BMI Body mass index is 22.87 kg/m. Constitutional:  oriented to person, place, and time. No distress.  HENT:  Head: Grossly normal Eyes:  no discharge. No scleral icterus.  Neck: No JVD, no carotid bruits  Cardiovascular: Regular rate and  rhythm, no murmurs appreciated Pulmonary/Chest: Clear to auscultation bilaterally, no wheezes or rails Abdominal: Soft.  no distension.  no tenderness.  Musculoskeletal: Normal range of motion Neurological:  normal muscle tone. Coordination normal. No atrophy Skin: Skin warm and dry Psychiatric: normal affect, pleasant  Recent Labs: 08/22/2018: ALT 16; BUN 17; Creat 1.32; Hemoglobin 18.5; Platelets 251; Potassium 5.1; Sodium 138    Lipid Panel Lab Results  Component Value Date   CHOL 156 08/22/2018   HDL 41 08/22/2018   LDLCALC 84 08/22/2018   TRIG 224 (H) 08/22/2018     Filed Weights   06/23/19 0848  Weight: 146 lb (66.2 kg)    ASSESSMENT AND PLAN:  Coronary artery disease involving native coronary artery of native heart without angina pectoris - Plan: EKG 12-Lead No anginal symptoms, recommended regular walking program Weight is up slightly, recommended weight loss, dietary changes to achieve goal LDL less than 70  Human immunodeficiency virus (HIV) disease ([place]) Managed by ID Stable numbers  Dyslipidemia LDL slightly above goal, recommended walking program dietary changes before adding more medication  S/P CABG (coronary artery bypass graft) No anginal symptoms, no further testing at this time Overall has been stable past several years  Essential hypertension Initial blood pressure elevated, slight improvement on my recheck, reports is well controlled at home He will call us if it starts to run high Recommended walking program   Total encounter time more than 25 minutes  Greater than 50% was spent in counseling and coordination of care with the patient   Disposition:   F/U  12 months   Orders Placed This Encounter  Procedures  . EKG 12-Lead     Signed, Esmond Plants, M.D., Ph.D. 06/23/2019  [place], [place]

## 2019-06-23 ENCOUNTER — Encounter: Payer: Self-pay | Admitting: Cardiovascular Disease

## 2019-06-23 ENCOUNTER — Ambulatory Visit (INDEPENDENT_AMBULATORY_CARE_PROVIDER_SITE_OTHER): Payer: Medicare HMO | Admitting: Cardiovascular Disease

## 2019-06-23 ENCOUNTER — Other Ambulatory Visit: Payer: Self-pay

## 2019-06-23 VITALS — BP 140/70 | HR 56 | Ht 67.0 in | Wt 146.0 lb

## 2019-06-23 DIAGNOSIS — I10 Essential (primary) hypertension: Secondary | ICD-10-CM

## 2019-06-23 DIAGNOSIS — E785 Hyperlipidemia, unspecified: Secondary | ICD-10-CM

## 2019-06-23 DIAGNOSIS — I25118 Atherosclerotic heart disease of native coronary artery with other forms of angina pectoris: Secondary | ICD-10-CM | POA: Diagnosis not present

## 2019-06-23 DIAGNOSIS — Z951 Presence of aortocoronary bypass graft: Secondary | ICD-10-CM | POA: Diagnosis not present

## 2019-06-23 DIAGNOSIS — Z87891 Personal history of nicotine dependence: Secondary | ICD-10-CM

## 2019-06-23 DIAGNOSIS — B20 Human immunodeficiency virus [HIV] disease: Secondary | ICD-10-CM | POA: Diagnosis not present

## 2019-06-23 MED ORDER — OMEGA-3-ACID ETHYL ESTERS 1 G PO CAPS: 1.0000 | ORAL_CAPSULE | Freq: Two times a day (BID) | ORAL | 3 refills | Status: DC

## 2019-06-23 MED ORDER — ATORVASTATIN CALCIUM 20 MG PO TABS: 20.0000 mg | ORAL_TABLET | Freq: Every day | ORAL | 3 refills | Status: DC

## 2019-06-23 NOTE — Patient Instructions (Addendum)
Medication Instructions:  Meds refilled  If you need a refill on your cardiac medications before your next appointment, please call your pharmacy.    Lab work: No new labs needed   If you have labs (blood work) drawn today and your tests are completely normal, you will receive your results only by: [name] MyChart Message (if you have MyChart) OR . A paper copy in the mail If you have any lab test that is abnormal or we need to change your treatment, we will call you to review the results.   Testing/Procedures: No new testing needed   Follow-Up: At [facility], you and your health needs are our priority.  As part of our continuing mission to provide you with exceptional heart care, we have created designated Provider Care Teams.  These Care Teams include your primary Cardiologist (physician) and Advanced Practice Providers (APPs -  Physician Assistants and Nurse Practitioners) who all work together to provide you with the care you need, when you need it.  . You will need a follow up appointment in 12 months   . Providers on your designated Care Team:   . Murray Hodgkins, NP . Christell Faith, PA-C . Marrianne Mood, PA-C  Any Other Special Instructions Will Be Listed Below (If Applicable).  For educational health videos Log in to : [email or web address] Or : [email or web address], password : [email or web address]  COVID-19 Vaccine Information can be found at: ShippingScam.co.uk For questions related to vaccine distribution or appointments, please email vaccine@[email or web address] or call [phone].

## 2019-07-27 ENCOUNTER — Other Ambulatory Visit: Payer: Self-pay | Admitting: Cardiovascular Disease

## 2019-07-28 NOTE — Telephone Encounter (Signed)
Medication was recently filled.   atorvastatin (LIPITOR) 20 MG tablet 90 tablet 3 06/23/2019    Sig - Route: Take 1 tablet (20 mg total) by mouth daily at 6 PM. - Oral   Sent to pharmacy as: atorvastatin (LIPITOR) 20 MG tablet   E-Prescribing Status: Receipt confirmed by pharmacy (06/23/2019  9:34 AM EST)   Pharmacy  [id number] - [place], [place]

## 2019-09-10 ENCOUNTER — Other Ambulatory Visit: Payer: Self-pay | Admitting: Family Medicine

## 2019-09-10 DIAGNOSIS — I714 Abdominal aortic aneurysm, without rupture, unspecified: Secondary | ICD-10-CM

## 2019-09-16 ENCOUNTER — Other Ambulatory Visit: Payer: Self-pay | Admitting: Cardiovascular Disease

## 2019-09-22 ENCOUNTER — Ambulatory Visit
Admission: RE | Admit: 2019-09-22 | Discharge: 2019-09-22 | Disposition: A | Discharge status: Home / Self Care | Payer: Medicare HMO | Source: Ambulatory Visit | Attending: Family Medicine | Admitting: Family Medicine

## 2019-09-22 DIAGNOSIS — I714 Abdominal aortic aneurysm, without rupture, unspecified: Secondary | ICD-10-CM

## 2019-10-12 ENCOUNTER — Other Ambulatory Visit: Payer: Self-pay | Admitting: Internal Medicine

## 2019-10-12 DIAGNOSIS — B20 Human immunodeficiency virus [HIV] disease: Secondary | ICD-10-CM

## 2020-01-27 ENCOUNTER — Encounter: Payer: Self-pay | Admitting: Ophthalmology

## 2020-01-30 ENCOUNTER — Other Ambulatory Visit
Admission: RE | Admit: 2020-01-30 | Discharge: 2020-01-30 | Disposition: A | Discharge status: Home / Self Care | Payer: Medicare HMO | Source: Ambulatory Visit | Attending: Ophthalmology | Admitting: Ophthalmology

## 2020-01-30 ENCOUNTER — Other Ambulatory Visit: Payer: Self-pay

## 2020-01-30 DIAGNOSIS — Z20822 Contact with and (suspected) exposure to covid-19: Secondary | ICD-10-CM | POA: Insufficient documentation

## 2020-01-30 DIAGNOSIS — Z01812 Encounter for preprocedural laboratory examination: Secondary | ICD-10-CM | POA: Insufficient documentation

## 2020-01-30 LAB — SARS CORONAVIRUS 2 (TAT 6-24 HRS): SARS Coronavirus 2: NEGATIVE

## 2020-01-30 NOTE — Discharge Instructions (Signed)

## 2020-02-03 ENCOUNTER — Encounter: Payer: Self-pay | Admitting: Ophthalmology

## 2020-02-03 ENCOUNTER — Other Ambulatory Visit: Payer: Self-pay

## 2020-02-03 ENCOUNTER — Ambulatory Visit
Admission: RE | Admit: 2020-02-03 | Discharge: 2020-02-03 | Disposition: A | Discharge status: Home / Self Care | Payer: Medicare HMO | Attending: Ophthalmology | Admitting: Ophthalmology

## 2020-02-03 ENCOUNTER — Encounter
Admission: RE | Disposition: A | Discharge status: Home / Self Care | Payer: Self-pay | Source: Home / Self Care | Attending: Ophthalmology

## 2020-02-03 ENCOUNTER — Ambulatory Visit: Payer: Medicare HMO | Admitting: Anesthesiology

## 2020-02-03 DIAGNOSIS — E7800 Pure hypercholesterolemia, unspecified: Secondary | ICD-10-CM | POA: Insufficient documentation

## 2020-02-03 DIAGNOSIS — Z79899 Other long term (current) drug therapy: Secondary | ICD-10-CM | POA: Diagnosis not present

## 2020-02-03 DIAGNOSIS — Z951 Presence of aortocoronary bypass graft: Secondary | ICD-10-CM | POA: Diagnosis not present

## 2020-02-03 DIAGNOSIS — B20 Human immunodeficiency virus [HIV] disease: Secondary | ICD-10-CM | POA: Diagnosis not present

## 2020-02-03 DIAGNOSIS — H2511 Age-related nuclear cataract, right eye: Secondary | ICD-10-CM | POA: Diagnosis not present

## 2020-02-03 DIAGNOSIS — R569 Unspecified convulsions: Secondary | ICD-10-CM | POA: Insufficient documentation

## 2020-02-03 DIAGNOSIS — I2510 Atherosclerotic heart disease of native coronary artery without angina pectoris: Secondary | ICD-10-CM | POA: Insufficient documentation

## 2020-02-03 DIAGNOSIS — F17200 Nicotine dependence, unspecified, uncomplicated: Secondary | ICD-10-CM | POA: Diagnosis not present

## 2020-02-03 HISTORY — DX: Gastro-esophageal reflux disease without esophagitis: K21.9

## 2020-02-03 HISTORY — DX: Pure hypercholesterolemia, unspecified: E78.00

## 2020-02-03 HISTORY — DX: Other seasonal allergic rhinitis: J30.2

## 2020-02-03 HISTORY — PX: CATARACT EXTRACTION W/PHACO: SHX586

## 2020-02-03 SURGERY — PHACOEMULSIFICATION, CATARACT, WITH IOL INSERTION
Anesthesia: Monitor Anesthesia Care | Site: Eye | Laterality: Right

## 2020-02-03 MED ORDER — MOXIFLOXACIN HCL 0.5 % OP SOLN
OPHTHALMIC | Status: DC | PRN
  Administered 2020-02-03: 0.2 mL via OPHTHALMIC

## 2020-02-03 MED ORDER — TETRACAINE HCL 0.5 % OP SOLN
1.0000 [drp] | OPHTHALMIC | Status: DC | PRN
  Administered 2020-02-03 (×3): 1 [drp] via OPHTHALMIC

## 2020-02-03 MED ORDER — ACETAMINOPHEN 160 MG/5ML PO SOLN: 325.0000 mg | ORAL | Status: DC | PRN

## 2020-02-03 MED ORDER — MIDAZOLAM HCL 2 MG/2ML IJ SOLN
INTRAMUSCULAR | Status: DC | PRN
  Administered 2020-02-03: 2 mg via INTRAVENOUS

## 2020-02-03 MED ORDER — ONDANSETRON HCL 4 MG/2ML IJ SOLN: 4.0000 mg | Freq: Once | INTRAMUSCULAR | Status: DC | PRN

## 2020-02-03 MED ORDER — EPINEPHRINE PF 1 MG/ML IJ SOLN
INTRAOCULAR | Status: DC | PRN
  Administered 2020-02-03: 48 mL via OPHTHALMIC

## 2020-02-03 MED ORDER — LIDOCAINE HCL (PF) 2 % IJ SOLN
INTRAOCULAR | Status: DC | PRN
  Administered 2020-02-03: 2 mL

## 2020-02-03 MED ORDER — NA CHONDROIT SULF-NA HYALURON 40-17 MG/ML IO SOLN
INTRAOCULAR | Status: DC | PRN
  Administered 2020-02-03: 1 mL via INTRAOCULAR

## 2020-02-03 MED ORDER — BRIMONIDINE TARTRATE-TIMOLOL 0.2-0.5 % OP SOLN
OPHTHALMIC | Status: DC | PRN
  Administered 2020-02-03: 1 [drp] via OPHTHALMIC

## 2020-02-03 MED ORDER — ACETAMINOPHEN 325 MG PO TABS: 325.0000 mg | ORAL_TABLET | ORAL | Status: DC | PRN

## 2020-02-03 MED ORDER — ARMC OPHTHALMIC DILATING DROPS
1.0000 "application " | OPHTHALMIC | Status: DC | PRN
  Administered 2020-02-03 (×3): 1 via OPHTHALMIC

## 2020-02-03 MED ORDER — FENTANYL CITRATE (PF) 100 MCG/2ML IJ SOLN
INTRAMUSCULAR | Status: DC | PRN
Start: 2020-02-03 — End: 2020-02-03
  Administered 2020-02-03 (×2): 50 ug via INTRAVENOUS

## 2020-02-03 SURGICAL SUPPLY — 19 items
CANNULA ANT/CHMB 27GA (MISCELLANEOUS) ×6 IMPLANT
GLOVE SURG LX 8.0 MICRO (GLOVE) ×2
GLOVE SURG LX STRL 8.0 MICRO (GLOVE) ×1 IMPLANT
GLOVE SURG TRIUMPH 8.0 PF LTX (GLOVE) ×3 IMPLANT
GOWN STRL REUS W/ TWL LRG LVL3 (GOWN DISPOSABLE) ×2 IMPLANT
GOWN STRL REUS W/TWL LRG LVL3 (GOWN DISPOSABLE) ×6
LENS IOL TECNIS EYHANCE 18.0 ×3 IMPLANT
MARKER SKIN DUAL TIP RULER LAB (MISCELLANEOUS) ×3 IMPLANT
NEEDLE FILTER BLUNT 18X 1/2SAF (NEEDLE) ×2
NEEDLE FILTER BLUNT 18X1 1/2 (NEEDLE) ×1 IMPLANT
PACK EYE AFTER SURG (MISCELLANEOUS) ×3 IMPLANT
PACK OPTHALMIC (MISCELLANEOUS) ×3 IMPLANT
PACK PORFILIO (MISCELLANEOUS) ×3 IMPLANT
SUT ETHILON 10-0 CS-B-6CS-B-6 (SUTURE)
SUTURE EHLN 10-0 CS-B-6CS-B-6 (SUTURE) IMPLANT
SYR 3ML LL SCALE MARK (SYRINGE) ×3 IMPLANT
SYR TB 1ML LUER SLIP (SYRINGE) ×3 IMPLANT
WATER STERILE IRR 250ML POUR (IV SOLUTION) ×3 IMPLANT
WIPE NON LINTING 3.25X3.25 (MISCELLANEOUS) ×3 IMPLANT

## 2020-02-03 NOTE — Op Note (Signed)
PREOPERATIVE DIAGNOSIS:  Nuclear sclerotic cataract of the right eye.   POSTOPERATIVE DIAGNOSIS:  H25.11 Cataract   OPERATIVE PROCEDURE:@   SURGEON:  Birder Robson, MD.   ANESTHESIA:  Anesthesiologist: Veda Canning, MD CRNA: Jeannene Patella, CRNA  1.      Managed anesthesia care. 2.      0.49ml of Shugarcaine was instilled in the eye following the paracentesis.   COMPLICATIONS:  None.   TECHNIQUE:   Stop and chop   DESCRIPTION OF PROCEDURE:  The patient was examined and consented in the preoperative holding area where the aforementioned topical anesthesia was applied to the right eye and then brought back to the Operating Room where the right eye was prepped and draped in the usual sterile ophthalmic fashion and a lid speculum was placed. A paracentesis was created with the side port blade and the anterior chamber was filled with viscoelastic. A near clear corneal incision was performed with the steel keratome. A continuous curvilinear capsulorrhexis was performed with a cystotome followed by the capsulorrhexis forceps. Hydrodissection and hydrodelineation were carried out with BSS on a blunt cannula. The lens was removed in a stop and chop  technique and the remaining cortical material was removed with the irrigation-aspiration handpiece. The capsular bag was inflated with viscoelastic and the Technis [id number]  lens was placed in the capsular bag without complication. The remaining viscoelastic was removed from the eye with the irrigation-aspiration handpiece. The wounds were hydrated. The anterior chamber was flushed with BSS and the eye was inflated to physiologic pressure. 0.65ml of Vigamox was placed in the anterior chamber. The wounds were found to be water tight. The eye was dressed with Combigan. The patient was given protective glasses to wear throughout the day and a shield with which to sleep tonight. The patient was also given drops with which to begin a drop regimen today and will  follow-up with me in one day. Implant Name Type Inv. Item Serial No. Manufacturer Lot No. LRB No. Used Action  LENS II EYHANCE 18.0 - [id number]  LENS II EYHANCE 18.0 [id number] JOHNSON   Right 1 Implanted   Procedure(s): CATARACT EXTRACTION PHACO AND INTRAOCULAR LENS PLACEMENT (IOC) RIGHT 4.30 00:28.6 (Right)  Electronically signed: Birder Robson 02/03/2020 8:20 AM

## 2020-02-03 NOTE — Transfer of Care (Signed)
Immediate Anesthesia Transfer of Care Note  Patient: [name] Kim  Procedure(s) Performed: CATARACT EXTRACTION PHACO AND INTRAOCULAR LENS PLACEMENT (IOC) RIGHT 4.30 00:28.6 (Right Eye)  Patient Location: PACU  Anesthesia Type: MAC  Level of Consciousness: awake, alert  and patient cooperative  Airway and Oxygen Therapy: Patient Spontanous Breathing and Patient connected to supplemental oxygen  Post-op Assessment: Post-op Vital signs reviewed, Patient's Cardiovascular Status Stable, Respiratory Function Stable, Patent Airway and No signs of Nausea or vomiting  Post-op Vital Signs: Reviewed and stable  Complications: No complications documented.

## 2020-02-03 NOTE — Anesthesia Procedure Notes (Signed)
Procedure Name: MAC Date/Time: 02/03/2020 8:07 AM Performed by: Jeannene Patella, CRNA Pre-anesthesia Checklist: Patient identified, Emergency Drugs available, Suction available, Timeout performed and Patient being monitored Patient Re-evaluated:Patient Re-evaluated prior to induction Oxygen Delivery Method: Nasal cannula Placement Confirmation: positive ETCO2

## 2020-02-03 NOTE — H&P (Signed)
All labs reviewed. Abnormal studies sent to patients PCP when indicated.  Previous H&P reviewed, patient examined, there are NO CHANGES.  [name] Squier Porfilio9/14/20217:59 AM

## 2020-02-03 NOTE — Anesthesia Preprocedure Evaluation (Signed)
Anesthesia Evaluation  Patient identified by MRN, date of birth, ID band Patient awake    Reviewed: Allergy & Precautions, NPO status   Airway Mallampati: II  TM Distance: >3 FB     Dental   Pulmonary Current Smoker and Patient abstained from smoking.,    breath sounds clear to auscultation       Cardiovascular + CAD and + CABG (2007)   Rhythm:Regular Rate:Normal     Neuro/Psych Seizures - (last one > 10 yrs ago),     GI/Hepatic GERD  ,  Endo/Other    Renal/GU Renal InsufficiencyRenal disease     Musculoskeletal   Abdominal   Peds  Hematology   Anesthesia Other Findings HIV  Reproductive/Obstetrics                             Anesthesia Physical Anesthesia Plan  ASA: III  Anesthesia Plan: MAC   Post-op Pain Management:    Induction: Intravenous  PONV Risk Score and Plan: TIVA, Midazolam and Treatment may vary due to age or medical condition  Airway Management Planned: Natural Airway and Nasal Cannula  Additional Equipment:   Intra-op Plan:   Post-operative Plan:   Informed Consent: I have reviewed the patients History and Physical, chart, labs and discussed the procedure including the risks, benefits and alternatives for the proposed anesthesia with the patient or authorized representative who has indicated his/her understanding and acceptance.       Plan Discussed with: CRNA  Anesthesia Plan Comments:         Anesthesia Quick Evaluation

## 2020-02-03 NOTE — Anesthesia Postprocedure Evaluation (Signed)
Anesthesia Post Note  Patient: [name] Kim  Procedure(s) Performed: CATARACT EXTRACTION PHACO AND INTRAOCULAR LENS PLACEMENT (IOC) RIGHT 4.30 00:28.6 (Right Eye)     Patient location during evaluation: PACU Anesthesia Type: MAC Level of consciousness: awake Pain management: pain level controlled Vital Signs Assessment: post-procedure vital signs reviewed and stable Respiratory status: respiratory function stable Cardiovascular status: stable Postop Assessment: no signs of nausea or vomiting Anesthetic complications: no   No complications documented.  Veda Canning

## 2020-02-04 ENCOUNTER — Encounter: Payer: Self-pay | Admitting: Ophthalmology

## 2020-02-17 ENCOUNTER — Encounter: Payer: Self-pay | Admitting: Ophthalmology

## 2020-02-17 ENCOUNTER — Other Ambulatory Visit: Payer: Self-pay

## 2020-02-20 ENCOUNTER — Other Ambulatory Visit
Admission: RE | Admit: 2020-02-20 | Discharge: 2020-02-20 | Disposition: A | Discharge status: Home / Self Care | Payer: Medicare HMO | Source: Ambulatory Visit | Attending: Ophthalmology | Admitting: Ophthalmology

## 2020-02-20 ENCOUNTER — Other Ambulatory Visit: Payer: Self-pay

## 2020-02-20 DIAGNOSIS — Z20822 Contact with and (suspected) exposure to covid-19: Secondary | ICD-10-CM | POA: Diagnosis not present

## 2020-02-20 DIAGNOSIS — Z01812 Encounter for preprocedural laboratory examination: Secondary | ICD-10-CM | POA: Insufficient documentation

## 2020-02-20 NOTE — Discharge Instructions (Signed)

## 2020-02-21 LAB — SARS CORONAVIRUS 2 (TAT 6-24 HRS): SARS Coronavirus 2: NEGATIVE

## 2020-02-24 ENCOUNTER — Encounter
Admission: RE | Disposition: A | Discharge status: Home / Self Care | Payer: Self-pay | Source: Home / Self Care | Attending: Ophthalmology

## 2020-02-24 ENCOUNTER — Other Ambulatory Visit: Payer: Self-pay

## 2020-02-24 ENCOUNTER — Ambulatory Visit: Payer: Medicare HMO | Admitting: Anesthesiology

## 2020-02-24 ENCOUNTER — Ambulatory Visit
Admission: RE | Admit: 2020-02-24 | Discharge: 2020-02-24 | Disposition: A | Discharge status: Home / Self Care | Payer: Medicare HMO | Attending: Ophthalmology | Admitting: Ophthalmology

## 2020-02-24 ENCOUNTER — Encounter: Payer: Self-pay | Admitting: Ophthalmology

## 2020-02-24 DIAGNOSIS — I2510 Atherosclerotic heart disease of native coronary artery without angina pectoris: Secondary | ICD-10-CM | POA: Diagnosis not present

## 2020-02-24 DIAGNOSIS — N189 Chronic kidney disease, unspecified: Secondary | ICD-10-CM | POA: Diagnosis not present

## 2020-02-24 DIAGNOSIS — Z9049 Acquired absence of other specified parts of digestive tract: Secondary | ICD-10-CM | POA: Diagnosis not present

## 2020-02-24 DIAGNOSIS — I714 Abdominal aortic aneurysm, without rupture: Secondary | ICD-10-CM | POA: Insufficient documentation

## 2020-02-24 DIAGNOSIS — H2512 Age-related nuclear cataract, left eye: Secondary | ICD-10-CM | POA: Insufficient documentation

## 2020-02-24 DIAGNOSIS — Z79899 Other long term (current) drug therapy: Secondary | ICD-10-CM | POA: Diagnosis not present

## 2020-02-24 DIAGNOSIS — E785 Hyperlipidemia, unspecified: Secondary | ICD-10-CM | POA: Insufficient documentation

## 2020-02-24 DIAGNOSIS — Z951 Presence of aortocoronary bypass graft: Secondary | ICD-10-CM | POA: Diagnosis not present

## 2020-02-24 DIAGNOSIS — Z87891 Personal history of nicotine dependence: Secondary | ICD-10-CM | POA: Diagnosis not present

## 2020-02-24 DIAGNOSIS — E7800 Pure hypercholesterolemia, unspecified: Secondary | ICD-10-CM | POA: Diagnosis not present

## 2020-02-24 DIAGNOSIS — Z7982 Long term (current) use of aspirin: Secondary | ICD-10-CM | POA: Insufficient documentation

## 2020-02-24 DIAGNOSIS — Z21 Asymptomatic human immunodeficiency virus [HIV] infection status: Secondary | ICD-10-CM | POA: Diagnosis not present

## 2020-02-24 DIAGNOSIS — Z8669 Personal history of other diseases of the nervous system and sense organs: Secondary | ICD-10-CM | POA: Insufficient documentation

## 2020-02-24 DIAGNOSIS — Z888 Allergy status to other drugs, medicaments and biological substances status: Secondary | ICD-10-CM | POA: Diagnosis not present

## 2020-02-24 HISTORY — PX: CATARACT EXTRACTION W/PHACO: SHX586

## 2020-02-24 SURGERY — PHACOEMULSIFICATION, CATARACT, WITH IOL INSERTION
Anesthesia: Monitor Anesthesia Care | Site: Eye | Laterality: Left

## 2020-02-24 MED ORDER — EPINEPHRINE PF 1 MG/ML IJ SOLN
INTRAOCULAR | Status: DC | PRN
  Administered 2020-02-24: 42 mL via OPHTHALMIC

## 2020-02-24 MED ORDER — TETRACAINE HCL 0.5 % OP SOLN
1.0000 [drp] | OPHTHALMIC | Status: DC | PRN
  Administered 2020-02-24 (×3): 1 [drp] via OPHTHALMIC

## 2020-02-24 MED ORDER — NA CHONDROIT SULF-NA HYALURON 40-17 MG/ML IO SOLN
INTRAOCULAR | Status: DC | PRN
  Administered 2020-02-24: 1 mL via INTRAOCULAR

## 2020-02-24 MED ORDER — MOXIFLOXACIN HCL 0.5 % OP SOLN
OPHTHALMIC | Status: DC | PRN
  Administered 2020-02-24: 0.2 mL via OPHTHALMIC

## 2020-02-24 MED ORDER — MIDAZOLAM HCL 2 MG/2ML IJ SOLN
INTRAMUSCULAR | Status: DC | PRN
  Administered 2020-02-24 (×2): 1 mg via INTRAVENOUS

## 2020-02-24 MED ORDER — ARMC OPHTHALMIC DILATING DROPS
1.0000 "application " | OPHTHALMIC | Status: DC | PRN
  Administered 2020-02-24 (×3): 1 via OPHTHALMIC

## 2020-02-24 MED ORDER — BRIMONIDINE TARTRATE-TIMOLOL 0.2-0.5 % OP SOLN
OPHTHALMIC | Status: DC | PRN
  Administered 2020-02-24: 1 [drp] via OPHTHALMIC

## 2020-02-24 MED ORDER — FENTANYL CITRATE (PF) 100 MCG/2ML IJ SOLN
INTRAMUSCULAR | Status: DC | PRN
  Administered 2020-02-24 (×2): 50 ug via INTRAVENOUS

## 2020-02-24 MED ORDER — LIDOCAINE HCL (PF) 2 % IJ SOLN
INTRAOCULAR | Status: DC | PRN
  Administered 2020-02-24: 2 mL

## 2020-02-24 SURGICAL SUPPLY — 19 items
CANNULA ANT/CHMB 27G (MISCELLANEOUS) ×2 IMPLANT
CANNULA ANT/CHMB 27GA (MISCELLANEOUS) ×6 IMPLANT
GLOVE SURG LX 8.0 MICRO (GLOVE) ×2
GLOVE SURG LX STRL 8.0 MICRO (GLOVE) ×1 IMPLANT
GLOVE SURG TRIUMPH 8.0 PF LTX (GLOVE) ×3 IMPLANT
GOWN STRL REUS W/ TWL LRG LVL3 (GOWN DISPOSABLE) ×2 IMPLANT
GOWN STRL REUS W/TWL LRG LVL3 (GOWN DISPOSABLE) ×6
LENS IOL TECNIS EYHANCE 17.0 (Intraocular Lens) ×2 IMPLANT
MARKER SKIN DUAL TIP RULER LAB (MISCELLANEOUS) ×3 IMPLANT
NDL FILTER BLUNT 18X1 1/2 (NEEDLE) ×1 IMPLANT
NEEDLE FILTER BLUNT 18X 1/2SAF (NEEDLE) ×2
NEEDLE FILTER BLUNT 18X1 1/2 (NEEDLE) ×1 IMPLANT
PACK EYE AFTER SURG (MISCELLANEOUS) ×3 IMPLANT
PACK OPTHALMIC (MISCELLANEOUS) ×3 IMPLANT
PACK PORFILIO (MISCELLANEOUS) ×3 IMPLANT
SYR 3ML LL SCALE MARK (SYRINGE) ×3 IMPLANT
SYR TB 1ML LUER SLIP (SYRINGE) ×3 IMPLANT
WATER STERILE IRR 250ML POUR (IV SOLUTION) ×3 IMPLANT
WIPE NON LINTING 3.25X3.25 (MISCELLANEOUS) ×3 IMPLANT

## 2020-02-24 NOTE — H&P (Signed)
[place]   Primary Care Physician:  Bernerd Limbo, MD Ophthalmologist: Dr. George Ina  Pre-Procedure History & Physical: HPI:  [name] Kim is a 69 y.o. male here for cataract surgery.   Past Medical History:  Diagnosis Date  . AAA (abdominal aortic aneurysm) ([place])   . CAD (coronary artery disease)   . Chronic kidney disease   . GERD (gastroesophageal reflux disease)   . HIV infection ([place])   . Hypercholesteremia   . Hyperlipidemia   . Seasonal allergies   . Seizures ([place])    last one Oct 2003 petit mal  . Sleep behavior disorder, 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    Past Surgical History:  Procedure Laterality Date  . APPENDECTOMY  1970  . CATARACT EXTRACTION W/PHACO Right 02/03/2020   Procedure: CATARACT EXTRACTION PHACO AND INTRAOCULAR LENS PLACEMENT (IOC) RIGHT 4.30 00:28.6;  Surgeon: Birder Robson, MD;  Location: [facility];  Service: Ophthalmology;  Laterality: Right;  . CHOLECYSTECTOMY    . CORONARY ARTERY BYPASS GRAFT  08-09-2005   LIMA-LAD, SVG-diagonal, SVG-OM  . TONSILECTOMY, ADENOIDECTOMY, BILATERAL MYRINGOTOMY AND TUBES  [id number]  . TONSILLECTOMY      Prior to Admission medications   Medication Sig Start Date End Date Taking? Authorizing Provider  albuterol (PROVENTIL HFA;VENTOLIN HFA) 108 (90 Base) MCG/ACT inhaler Inhale into the lungs every 6 (six) hours as needed for wheezing or shortness of breath.   Yes [provider]  ANORO ELLIPTA 62.5-25 MCG/INH AEPB 1 puff as needed.  03/17/19  Yes [provider]  aspirin 81 MG tablet Take 81 mg by mouth daily.   Yes [provider]  atorvastatin (LIPITOR) 20 MG tablet Take 1 tablet (20 mg total) by mouth daily at 6 PM. 06/23/19  Yes Gollan, Kathlene November, MD  cholecalciferol (VITAMIN D) 1000 units tablet Take 1,000 Units by mouth daily.   Yes [provider]  folic acid (FOLVITE) 858 MCG tablet Take 400 mcg by mouth daily.   Yes [provider]  GENVOYA 150-150-200-10 MG TABS  tablet TAKE 1 TABLET BY MOUTH DAILY WITH BREAKFAST 10/13/19  Yes Michel Bickers, MD  lamoTRIgine (LAMICTAL) 100 MG tablet Take 100 mg by mouth daily.    Yes [provider]  omega-3 acid ethyl esters (LOVAZA) 1 g capsule TAKE 1 CAPSULE BY MOUTH TWICE DAILY 09/16/19  Yes Gollan, Kathlene November, MD  vitamin B-12 (CYANOCOBALAMIN) 500 MCG tablet Take 5,000 mcg by mouth daily.   Yes [provider]    Allergies as of 02/05/2020 - Review Complete 02/03/2020  Allergen Reaction Noted  . Azt [zidovudine] Other (See Comments) 01/23/2012    Family History  Problem Relation Age of Onset  . Cancer Mother   . Cancer Father        leukemia    Social History   Socioeconomic History  . Marital status: Single    Spouse name: Not on file  . Number of children: Not on file  . Years of education: Not on file  . Highest education level: Not on file  Occupational History  . Occupation: Product/process development scientist- retired  Tobacco Use  . Smoking status: Former Smoker    Packs/day: 0.50    Types: Cigarettes    Quit date: 08/09/2005    Years since quitting: 14.5  . Smokeless tobacco: Never Used  Vaping Use  . Vaping Use: Never used  Substance and Sexual Activity  . Alcohol use: Yes    Alcohol/week: 0.0 standard drinks    Comment: about 2  per week  . Drug use: No  . Sexual activity: Yes    Partners: Male    Birth control/protection: Condom    Comment: declined condoms  Other Topics Concern  . Not on file  Social History Narrative   Patient is right handed.   Patient drinks 1 cup of coffee daily.   Social Determinants of Health   Financial Resource Strain:   . Difficulty of Paying Living Expenses: Not on file  Food Insecurity:   . Worried About Charity fundraiser in the Last Year: Not on file  . Ran Out of Food in the Last Year: Not on file  Transportation Needs:   . Lack of Transportation (Medical): Not on file  . Lack of Transportation (Non-Medical): Not on file  Physical  Activity:   . Days of Exercise per Week: Not on file  . Minutes of Exercise per Session: Not on file  Stress:   . Feeling of Stress : Not on file  Social Connections:   . Frequency of Communication with Friends and Family: Not on file  . Frequency of Social Gatherings with Friends and Family: Not on file  . Attends Religious Services: Not on file  . Active Member of Clubs or Organizations: Not on file  . Attends Archivist Meetings: Not on file  . Marital Status: Not on file  Intimate Partner Violence:   . Fear of Current or Ex-Partner: Not on file  . Emotionally Abused: Not on file  . Physically Abused: Not on file  . Sexually Abused: Not on file    Review of Systems: See HPI, otherwise negative ROS  Physical Exam: BP (!) 177/80   Pulse (!) 58   Temp (!) 97.3 F (36.3 C) (Temporal)   Ht 5\' 7"  (1.702 m)   Wt 67.1 kg   SpO2 95%   BMI 23.18 kg/m  General:   Alert,  pleasant and cooperative in NAD Head:  Normocephalic and atraumatic. Lungs:  Clear to auscultation.    Heart:  Regular rate and rhythm.  Impression/Plan: [name] Kim is here for cataract surgery.  Risks, benefits, limitations, and alternatives regarding cataract surgery have been reviewed with the patient.  Questions have been answered.  All parties agreeable.   Birder Robson, MD  02/24/2020, 8:18 AM

## 2020-02-24 NOTE — Op Note (Signed)
PREOPERATIVE DIAGNOSIS:  Nuclear sclerotic cataract of the left eye.   POSTOPERATIVE DIAGNOSIS:  Nuclear sclerotic cataract of the left eye.   OPERATIVE PROCEDURE:@   SURGEON:  Birder Robson, MD.   ANESTHESIA:  Anesthesiologist: Veda Canning, MD CRNA: Cameron Ali, CRNA  1.      Managed anesthesia care. 2.     0.72ml of Shugarcaine was instilled following the paracentesis   COMPLICATIONS:  None.   TECHNIQUE:   Stop and chop   DESCRIPTION OF PROCEDURE:  The patient was examined and consented in the preoperative holding area where the aforementioned topical anesthesia was applied to the left eye and then brought back to the Operating Room where the left eye was prepped and draped in the usual sterile ophthalmic fashion and a lid speculum was placed. A paracentesis was created with the side port blade and the anterior chamber was filled with viscoelastic. A near clear corneal incision was performed with the steel keratome. A continuous curvilinear capsulorrhexis was performed with a cystotome followed by the capsulorrhexis forceps. Hydrodissection and hydrodelineation were carried out with BSS on a blunt cannula. The lens was removed in a stop and chop  technique and the remaining cortical material was removed with the irrigation-aspiration handpiece. The capsular bag was inflated with viscoelastic and the Technis [id number] lens was placed in the capsular bag without complication. The remaining viscoelastic was removed from the eye with the irrigation-aspiration handpiece. The wounds were hydrated. The anterior chamber was flushed with BSS and the eye was inflated to physiologic pressure. 0.39ml Vigamox was placed in the anterior chamber. The wounds were found to be water tight. The eye was dressed with Combigan. The patient was given protective glasses to wear throughout the day and a shield with which to sleep tonight. The patient was also given drops with which to begin a drop regimen today and  will follow-up with me in one day. Implant Name Type Inv. Item Serial No. Manufacturer Lot No. LRB No. Used Action  LENS II EYHANCE 17.0 - [id number] Intraocular Lens LENS II EYHANCE 17.0 [id number] JOHNSON   Left 1 Implanted    Procedure(s): CATARACT EXTRACTION PHACO AND INTRAOCULAR LENS PLACEMENT (IOC) LEFT 5.58 00:32.2 (Left)  Electronically signed: Birder Robson 02/24/2020 8:42 AM

## 2020-02-24 NOTE — Anesthesia Preprocedure Evaluation (Signed)
Anesthesia Evaluation  Patient identified by MRN, date of birth, ID band Patient awake    Reviewed: Allergy & Precautions, NPO status   Airway Mallampati: II  TM Distance: >3 FB     Dental   Pulmonary Current Smoker and Patient abstained from smoking., former smoker,    breath sounds clear to auscultation       Cardiovascular + CAD and + CABG (2007)   Rhythm:Regular Rate:Normal     Neuro/Psych Seizures - (last one > 10 yrs ago),     GI/Hepatic GERD  ,  Endo/Other    Renal/GU Renal InsufficiencyRenal disease     Musculoskeletal   Abdominal   Peds  Hematology   Anesthesia Other Findings HIV  Reproductive/Obstetrics                             Anesthesia Physical  Anesthesia Plan  ASA: III  Anesthesia Plan: MAC   Post-op Pain Management:    Induction: Intravenous  PONV Risk Score and Plan: TIVA, Midazolam and Treatment may vary due to age or medical condition  Airway Management Planned: Natural Airway and Nasal Cannula  Additional Equipment:   Intra-op Plan:   Post-operative Plan:   Informed Consent: I have reviewed the patients History and Physical, chart, labs and discussed the procedure including the risks, benefits and alternatives for the proposed anesthesia with the patient or authorized representative who has indicated his/her understanding and acceptance.       Plan Discussed with: CRNA  Anesthesia Plan Comments:         Anesthesia Quick Evaluation

## 2020-02-24 NOTE — Anesthesia Procedure Notes (Signed)
Procedure Name: MAC Date/Time: 02/24/2020 8:25 AM Performed by: Cameron Ali, CRNA Pre-anesthesia Checklist: Patient identified, Emergency Drugs available, Suction available, Timeout performed and Patient being monitored Patient Re-evaluated:Patient Re-evaluated prior to induction Oxygen Delivery Method: Nasal cannula Placement Confirmation: positive ETCO2

## 2020-02-24 NOTE — Anesthesia Postprocedure Evaluation (Signed)
Anesthesia Post Note  Patient: [name] Kim  Procedure(s) Performed: CATARACT EXTRACTION PHACO AND INTRAOCULAR LENS PLACEMENT (IOC) LEFT 5.58 00:32.2 (Left Eye)     Patient location during evaluation: PACU Anesthesia Type: MAC Level of consciousness: awake Pain management: pain level controlled Vital Signs Assessment: post-procedure vital signs reviewed and stable Respiratory status: respiratory function stable Cardiovascular status: stable Postop Assessment: no apparent nausea or vomiting Anesthetic complications: no   No complications documented.  Veda Canning

## 2020-02-24 NOTE — Transfer of Care (Signed)
Immediate Anesthesia Transfer of Care Note  Patient: [name] Kim  Procedure(s) Performed: CATARACT EXTRACTION PHACO AND INTRAOCULAR LENS PLACEMENT (IOC) LEFT 5.58 00:32.2 (Left Eye)  Patient Location: PACU  Anesthesia Type: MAC  Level of Consciousness: awake, alert  and patient cooperative  Airway and Oxygen Therapy: Patient Spontanous Breathing and Patient connected to supplemental oxygen  Post-op Assessment: Post-op Vital signs reviewed, Patient's Cardiovascular Status Stable, Respiratory Function Stable, Patent Airway and No signs of Nausea or vomiting  Post-op Vital Signs: Reviewed and stable  Complications: No complications documented.

## 2020-02-25 ENCOUNTER — Encounter: Payer: Self-pay | Admitting: Ophthalmology

## 2020-03-17 ENCOUNTER — Other Ambulatory Visit: Payer: Self-pay | Admitting: Internal Medicine

## 2020-03-17 DIAGNOSIS — B20 Human immunodeficiency virus [HIV] disease: Secondary | ICD-10-CM

## 2020-03-21 ENCOUNTER — Other Ambulatory Visit: Payer: Self-pay | Admitting: Internal Medicine

## 2020-03-21 DIAGNOSIS — B20 Human immunodeficiency virus [HIV] disease: Secondary | ICD-10-CM

## 2020-03-25 ENCOUNTER — Other Ambulatory Visit: Payer: Medicare HMO

## 2020-03-25 ENCOUNTER — Other Ambulatory Visit: Payer: Self-pay

## 2020-03-25 DIAGNOSIS — B20 Human immunodeficiency virus [HIV] disease: Secondary | ICD-10-CM

## 2020-03-26 LAB — T-HELPER CELL (CD4) - (RCID CLINIC ONLY)
CD4 % Helper T Cell: 23 % — ABNORMAL LOW (ref 33–65)
CD4 T Cell Abs: 610 /uL (ref 400–1790)

## 2020-03-28 LAB — LIPID PANEL
Cholesterol: 160 mg/dL (ref <200)
HDL: 43 mg/dL (ref >=40)
LDL Cholesterol (Calc): 76 mg/dL (calc)
Non-HDL Cholesterol (Calc): 117 mg/dL (calc) (ref <130)
Total CHOL/HDL Ratio: 3.7 (calc) (ref <5.0)
Triglycerides: 339 mg/dL — ABNORMAL HIGH (ref <150)

## 2020-03-28 LAB — RPR: RPR Ser Ql: NONREACTIVE

## 2020-03-28 LAB — CBC
HCT: 48.5 % (ref 38.5–50.0)
Hemoglobin: 17.2 g/dL — ABNORMAL HIGH (ref 13.2–17.1)
MCH: 36.4 pg — ABNORMAL HIGH (ref 27.0–33.0)
MCHC: 35.5 g/dL (ref 32.0–36.0)
MCV: 102.8 fL — ABNORMAL HIGH (ref 80.0–100.0)
MPV: 10.5 fL (ref 7.5–12.5)
Platelets: 246 10*3/uL (ref 140–400)
RBC: 4.72 10*6/uL (ref 4.20–5.80)
RDW: 11.8 % (ref 11.0–15.0)
WBC: 9.7 10*3/uL (ref 3.8–10.8)

## 2020-03-28 LAB — COMPREHENSIVE METABOLIC PANEL
AG Ratio: 1.8 (calc) (ref 1.0–2.5)
ALT: 14 U/L (ref 9–46)
AST: 15 U/L (ref 10–35)
Albumin: 4.9 g/dL (ref 3.6–5.1)
Alkaline phosphatase (APISO): 70 U/L (ref 35–144)
BUN/Creatinine Ratio: 11 (calc) (ref 6–22)
BUN: 14 mg/dL (ref 7–25)
CO2: 29 mmol/L (ref 20–32)
Calcium: 10.9 mg/dL — ABNORMAL HIGH (ref 8.6–10.3)
Chloride: 101 mmol/L (ref 98–110)
Creat: 1.27 mg/dL — ABNORMAL HIGH (ref 0.70–1.25)
Globulin: 2.7 g/dL (calc) (ref 1.9–3.7)
Glucose, Bld: 87 mg/dL (ref 65–99)
Potassium: 5.2 mmol/L (ref 3.5–5.3)
Sodium: 139 mmol/L (ref 135–146)
Total Bilirubin: 0.6 mg/dL (ref 0.2–1.2)
Total Protein: 7.6 g/dL (ref 6.1–8.1)

## 2020-03-28 LAB — HIV-1 RNA QUANT-NO REFLEX-BLD
HIV 1 RNA Quant: <20 Copies/mL — ABNORMAL HIGH
HIV-1 RNA Quant, Log: <1.3 Log cps/mL — ABNORMAL HIGH

## 2020-04-08 ENCOUNTER — Encounter: Payer: Medicare HMO | Admitting: Internal Medicine

## 2020-04-14 ENCOUNTER — Encounter: Payer: Self-pay | Admitting: Internal Medicine

## 2020-04-14 ENCOUNTER — Other Ambulatory Visit: Payer: Self-pay

## 2020-04-14 ENCOUNTER — Ambulatory Visit (INDEPENDENT_AMBULATORY_CARE_PROVIDER_SITE_OTHER): Payer: Medicare HMO | Admitting: Internal Medicine

## 2020-04-14 DIAGNOSIS — B20 Human immunodeficiency virus [HIV] disease: Secondary | ICD-10-CM | POA: Diagnosis not present

## 2020-04-14 MED ORDER — GENVOYA 150-150-200-10 MG PO TABS: 1.0000 | ORAL_TABLET | Freq: Every day | ORAL | 11 refills | Status: DC

## 2020-04-14 NOTE — Progress Notes (Signed)
Patient Active Problem List   Diagnosis Date Noted  . Human immunodeficiency virus (HIV) disease ([name] Kim) 01/23/2012    Priority: High  . Tubular adenoma of colon 11/13/2018  . Abdominal aortic aneurysm (AAA) without rupture ([name] Kim) 01/17/2018  . Macrocytosis without anemia 08/07/2017  . Smoking hx 12/18/2016  . Seasonal allergies 10/26/2016  . Colon polyposis 03/01/2015  . 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 Seasonal allergic rhinitis due to pollen 03/01/2015  . 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 Acute renal insufficiency 10/31/2012  . Other malaise and fatigue 10/29/2012  . Weight loss, unintentional 10/29/2012  . Dyslipidemia 02/08/2012  . 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 Coronary artery disease 01/23/2012  . Seizure disorder ([name] Kim) 01/23/2012  . Coronary atherosclerosis 01/23/2012    Patient's Medications  New Prescriptions   No medications on file  Previous Medications   ALBUTEROL (PROVENTIL HFA;VENTOLIN HFA) 108 (90 BASE) MCG/ACT INHALER    Inhale into the lungs every 6 (six) hours as needed for wheezing or shortness of breath.   ANORO ELLIPTA 62.5-25 MCG/INH AEPB    1 puff as needed.    ASPIRIN 81 MG TABLET    Take 81 mg by mouth daily.   ATORVASTATIN (LIPITOR) 20 MG TABLET    Take 1 tablet (20 mg total) by mouth daily at 6 PM.   CHOLECALCIFEROL (VITAMIN D) 1000 UNITS TABLET    Take 1,000 Units by mouth daily.   FOLIC ACID (FOLVITE) 845 MCG TABLET    Take 400 mcg by mouth daily.   LAMOTRIGINE (LAMICTAL) 100 MG TABLET    Take 100 mg by mouth daily.    OMEGA-3 ACID ETHYL ESTERS (LOVAZA) 1 G CAPSULE    TAKE 1 CAPSULE BY MOUTH TWICE DAILY   VITAMIN B-12 (CYANOCOBALAMIN) 500 MCG TABLET    Take 5,000 mcg by mouth daily.  Modified Medications   Modified Medication Previous Medication    ELVITEGRAVIR-COBICISTAT-EMTRICITABINE-TENOFOVIR (GENVOYA) 150-150-200-10 MG TABS TABLET GENVOYA 150-150-200-10 MG TABS tablet      Take 1 tablet by mouth daily with breakfast.    TAKE 1 TABLET BY MOUTH DAILY WITH BREAKFAST  Discontinued Medications   No medications on file    Subjective: [name] Kim is in for his routine HIV follow-up visit.  He has not had any problems obtaining, taking or tolerating his Genvoya and never misses doses.  He recently got his Moderna Covid booster vaccine and he has had his influenza vaccination as well.  He recently had successful cataract surgery on both eyes.  He is feeling well.  He gets regular exercise with his partner, [name] Kim.  Review of Systems: Review of Systems  Constitutional: Negative for fever, malaise/fatigue and weight loss.    Past Medical History:  Diagnosis Date  . AAA (abdominal aortic aneurysm) ([name] Kim)   . CAD (coronary artery disease)   . Chronic kidney disease   . GERD (gastroesophageal reflux disease)   . HIV infection ([name] Kim)   . Hypercholesteremia   . Hyperlipidemia   . Seasonal allergies   . Seizures ([name] Kim)    last one Oct 2003 petit mal  . 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 Smoking status: Former Smoker    Packs/day: 0.50    Types: Cigarettes    Quit date: 08/09/2005    Years since quitting: 14.6  .  Smokeless tobacco: Never Used  Vaping Use  . Vaping Use: Never used  Substance Use Topics  . Alcohol use: Yes    Alcohol/week: 0.0 standard drinks    Comment: about 2 per week  . Drug use: No    Family History  Problem Relation Age of Onset  . Cancer Mother   . Cancer Father        leukemia    Allergies  Allergen Reactions  . Azt [[name]] Other (See Comments)    Caused bone marrow problems  2000    Health Maintenance  Topic Date Due  . COLONOSCOPY  Never done  . TETANUS/TDAP  02/03/2028  . INFLUENZA VACCINE  Completed  . COVID-19 Vaccine  Completed  . Hepatitis C  Screening  Completed  . PNA vac Low Risk Adult  Completed    Objective:  Vitals:   04/14/20 1025  BP: (!) 160/74  Pulse: 76  Resp: 16  SpO2: 98%  Weight: 147 lb (66.7 kg)  Height: 5\' 7"  (1.702 m)   Body mass index is 23.02 kg/m.  Physical Exam Constitutional:      Comments: He is in good spirits.  Cardiovascular:     Rate and Rhythm: Normal rate.  Pulmonary:     Effort: Pulmonary effort is normal.  Psychiatric:        Mood and Affect: Mood normal.     Lab Results Lab Results  Component Value Date   WBC 9.7 03/25/2020   HGB 17.2 (H) 03/25/2020   HCT 48.5 03/25/2020   MCV 102.8 (H) 03/25/2020   PLT 246 03/25/2020    Lab Results  Component Value Date   CREATININE 1.27 (H) 03/25/2020   BUN 14 03/25/2020   NA 139 03/25/2020   K 5.2 03/25/2020   CL 101 03/25/2020   CO2 29 03/25/2020    Lab Results  Component Value Date   ALT 14 03/25/2020   AST 15 03/25/2020   ALKPHOS 73 10/12/2016   BILITOT 0.6 03/25/2020    Lab Results  Component Value Date   CHOL 160 03/25/2020   HDL 43 03/25/2020   LDLCALC 76 03/25/2020   LDLDIRECT 126.1 02/19/2013   TRIG 339 (H) 03/25/2020   CHOLHDL 3.7 03/25/2020   Lab Results  Component Value Date   LABRPR NON-REACTIVE 03/25/2020   HIV 1 RNA Quant  Date Value  03/25/2020 <20 Copies/mL (H)  03/11/2019 <20 NOT DETECTED copies/mL  08/22/2018 <20 NOT DETECTED copies/mL   CD4 T Cell Abs (/uL)  Date Value  03/25/2020 610  03/11/2019 563  08/22/2018 450     Problem List Items Addressed This Visit      High   Human immunodeficiency virus (HIV) disease (HCC) (Chronic)    His infection remains under excellent, long-term control.  He will continue Genvoya and follow-up after lab work in 1 year.      Relevant Medications   elvitegravir-cobicistat-emtricitabine-tenofovir (GENVOYA) 150-150-200-10 MG TABS tablet   Other Relevant Orders   CBC   T-helper cell (CD4)- ([facility] clinic only)   Comprehensive metabolic panel    Lipid panel   RPR   HIV-1 RNA quant-no reflex-bld        Michel Bickers, MD [facility] for [place] [phone] pager   [phone] cell 04/14/2020, 10:57 AM

## 2020-04-14 NOTE — Assessment & Plan Note (Signed)
His infection remains under excellent, long-term control.  He will continue Genvoya and follow-up after lab work in 1 year.

## 2020-06-22 ENCOUNTER — Ambulatory Visit: Payer: Medicare HMO | Admitting: Cardiovascular Disease

## 2020-07-19 NOTE — Progress Notes (Signed)
Cardiology Office Note  Date:  07/20/2020   ID:  Burnett, Spray [birth year], MRN [id number]  PCP:  Bernerd Limbo, MD   Chief Complaint  Patient presents with  . Other    12 month f/u no complaints today. Meds reviewed verbally with pt.    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 No regular exercise program Steady trend up in his triglycerides, cholesterol over the past 3 years  120 cholesterol now 160 Triglycerides climbing  Has not been [place] as [place] When weather is good walks outside, Previously had membership to the [facility] but does not go  No chest pain shortness on exertion  Labs reviewed Total chol 160, LDL 84 CR 1.32  EKG personally reviewed by myself on todays visit Sinus bradycardia rate 52 bpm  Other past medical hx reviewed  abdominal aortic ultrasound showing small AAA dated 09/2017 Distal abdominal aorta measuring 3.6 cm in greatest dimension.  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  PSH:    Past Surgical History:  Procedure Laterality Date  . APPENDECTOMY  1970  . CATARACT EXTRACTION Bilateral   . CATARACT EXTRACTION W/PHACO Right 02/03/2020    Procedure: CATARACT EXTRACTION PHACO AND INTRAOCULAR LENS PLACEMENT (IOC) RIGHT 4.30 00:28.6;  Surgeon: Birder Robson, MD;  Location: [place];  Service: Ophthalmology;  Laterality: Right;  . CATARACT EXTRACTION W/PHACO Left 02/24/2020   Procedure: CATARACT EXTRACTION PHACO AND INTRAOCULAR LENS PLACEMENT (IOC) LEFT 5.58 00:32.2;  Surgeon: Birder Robson, MD;  Location: [place];  Service: Ophthalmology;  Laterality: Left;  . CHOLECYSTECTOMY    . CORONARY ARTERY BYPASS GRAFT  08-09-2005   LIMA-LAD, SVG-diagonal, SVG-OM  . TONSILECTOMY, ADENOIDECTOMY, BILATERAL MYRINGOTOMY AND TUBES  [id number]  . TONSILLECTOMY      Current Outpatient Medications  Medication Sig Dispense Refill  . albuterol (PROVENTIL HFA;VENTOLIN HFA) 108 (90 Base) MCG/ACT inhaler Inhale into the lungs every 6 (six) hours as needed for wheezing or shortness of breath.    Jearl Klinefelter ELLIPTA 62.5-25 MCG/INH AEPB 1 puff as needed.     [name] aspirin 81 MG tablet Take 81 mg by mouth daily.    [name] atorvastatin (LIPITOR) 20 MG tablet Take 1 tablet (20 mg total) by mouth daily at 6 PM. 90 tablet 3  . cholecalciferol (VITAMIN D) 1000 units tablet Take 1,000 Units by mouth daily.    [name] elvitegravir-cobicistat-emtricitabine-tenofovir (GENVOYA) 150-150-200-10 MG TABS tablet Take 1 tablet by mouth daily with breakfast. 30 tablet 11  . folic acid (FOLVITE) 993 MCG tablet Take 400 mcg by mouth daily.    [name] lamoTRIgine (LAMICTAL) 100 MG tablet Take 100 mg by mouth daily.     [name] omega-3 acid ethyl esters (LOVAZA) 1 g capsule TAKE 1 CAPSULE BY MOUTH  TWICE DAILY 180 capsule 2  . vitamin B-12 (CYANOCOBALAMIN) 500 MCG tablet Take 5,000 mcg by mouth daily.     Current Facility-Administered Medications  Medication Dose Route Frequency Provider Last Rate Last Admin  . fexofenadine (ALLEGRA) tablet 180 mg  180 mg Oral Daily Michel Bickers, MD         Allergies:   Azt [zidovudine]   Social History:  The patient  reports that he quit  smoking about 14 years ago. His smoking use included cigarettes. He smoked 0.50 packs per day. He has never used smokeless tobacco. He reports current alcohol use. He reports that he does not use drugs.   Family History:   family history includes Cancer in his father and mother.    Review of Systems: Review of Systems  Constitutional: Negative.   Respiratory: Negative.   Cardiovascular: Negative.   Gastrointestinal: Negative.   Musculoskeletal: Negative.   Neurological: Negative.   Psychiatric/Behavioral: Negative.   All other systems reviewed and are negative.   PHYSICAL EXAM: VS:  BP (!) 158/60 (BP Location: Left Arm, Patient Position: Sitting, Cuff Size: Normal)   Pulse (!) 52   Ht 5' 6.5" (1.689 m)   Wt 142 lb 6 oz (64.6 kg)   SpO2 95%   BMI 22.64 kg/m  , BMI Body mass index is 22.64 kg/m. Constitutional:  oriented to person, place, and time. No distress.  HENT:  Head: Grossly normal Eyes:  no discharge. No scleral icterus.  Neck: No JVD, no carotid bruits  Cardiovascular: Regular rate and rhythm, no murmurs appreciated Pulmonary/Chest: Clear to auscultation bilaterally, no wheezes or rails Abdominal: Soft.  no distension.  no tenderness.  Musculoskeletal: Normal range of motion Neurological:  normal muscle tone. Coordination normal. No atrophy Skin: Skin warm and dry Psychiatric: normal affect, pleasant   Recent Labs: 03/25/2020: ALT 14; BUN 14; Creat 1.27; Hemoglobin 17.2; Platelets 246; Potassium 5.2; Sodium 139    Lipid Panel Lab Results  Component Value Date   CHOL 160 03/25/2020   HDL 43 03/25/2020   LDLCALC 76 03/25/2020   TRIG 339 (H) 03/25/2020     Filed Weights   07/20/20 0832  Weight: 142 lb 6 oz (64.6 kg)    ASSESSMENT AND PLAN:  Coronary artery disease involving native coronary artery of native heart without angina pectoris - Weight running high, cholesterol higher, triglycerides higher Stressed importance of aggressive walking program  lifestyle modification We will add Zetia 10 mg daily to his Lipitor 20 goal LDL less than 70, total cholesterol less than 150  Human immunodeficiency virus (HIV) disease ([place]) Managed by ID Reports no setbacks  Dyslipidemia Lifestyle modification recommended, diet restriction, walking program for weight loss, add Zetia  S/P CABG (coronary artery bypass graft) Currently with no symptoms of angina. No further workup at this time. Continue current medication regimen.   Essential hypertension Blood pressure typically always elevated on first arrival to the office, well controlled at home 165 systolic Weight loss recommended to avoid adding new medications Suggest to monitor blood pressure closely at home   Total encounter time more than 25 minutes  Greater than 50% was spent in counseling and coordination of care with the patient    Orders Placed This Encounter  Procedures  . EKG 12-Lead     Signed, Esmond Plants, M.D., Ph.D. 07/20/2020  [place], [place]

## 2020-07-20 ENCOUNTER — Encounter: Payer: Self-pay | Admitting: Cardiovascular Disease

## 2020-07-20 ENCOUNTER — Other Ambulatory Visit: Payer: Self-pay | Admitting: Internal Medicine

## 2020-07-20 ENCOUNTER — Other Ambulatory Visit: Payer: Self-pay

## 2020-07-20 ENCOUNTER — Ambulatory Visit (INDEPENDENT_AMBULATORY_CARE_PROVIDER_SITE_OTHER): Payer: Medicare HMO | Admitting: Cardiovascular Disease

## 2020-07-20 VITALS — BP 158/60 | HR 52 | Ht 66.5 in | Wt 142.4 lb

## 2020-07-20 DIAGNOSIS — I10 Essential (primary) hypertension: Secondary | ICD-10-CM

## 2020-07-20 DIAGNOSIS — Z951 Presence of aortocoronary bypass graft: Secondary | ICD-10-CM | POA: Diagnosis not present

## 2020-07-20 DIAGNOSIS — I25118 Atherosclerotic heart disease of native coronary artery with other forms of angina pectoris: Secondary | ICD-10-CM

## 2020-07-20 DIAGNOSIS — B20 Human immunodeficiency virus [HIV] disease: Secondary | ICD-10-CM

## 2020-07-20 DIAGNOSIS — E785 Hyperlipidemia, unspecified: Secondary | ICD-10-CM

## 2020-07-20 DIAGNOSIS — Z87891 Personal history of nicotine dependence: Secondary | ICD-10-CM

## 2020-07-20 DIAGNOSIS — I714 Abdominal aortic aneurysm, without rupture, unspecified: Secondary | ICD-10-CM

## 2020-07-20 MED ORDER — BICTEGRAVIR-EMTRICITAB-TENOFOV 50-200-25 MG PO TABS: 1.0000 | ORAL_TABLET | Freq: Every day | ORAL | 11 refills | Status: DC

## 2020-07-20 MED ORDER — EZETIMIBE 10 MG PO TABS: 10.0000 mg | ORAL_TABLET | Freq: Every day | ORAL | 3 refills | Status: DC

## 2020-07-20 NOTE — Patient Instructions (Addendum)
Medication Instructions:  Zetia 10 mg daily for cholesterol  If you need a refill on your cardiac medications before your next appointment, please call your pharmacy.    Lab work: No new labs needed   If you have labs (blood work) drawn today and your tests are completely normal, you will receive your results only by: [name] MyChart Message (if you have MyChart) OR . A paper copy in the mail If you have any lab test that is abnormal or we need to change your treatment, we will call you to review the results.   Testing/Procedures: No new testing needed   Follow-Up: At [facility], you and your health needs are our priority.  As part of our continuing mission to provide you with exceptional heart care, we have created designated Provider Care Teams.  These Care Teams include your primary Cardiologist (physician) and Advanced Practice Providers (APPs -  Physician Assistants and Nurse Practitioners) who all work together to provide you with the care you need, when you need it.  . You will need a follow up appointment in 12 months  . Providers on your designated Care Team:   . Murray Hodgkins, NP . Christell Faith, PA-C . Marrianne Mood, PA-C  Any Other Special Instructions Will Be Listed Below (If Applicable).  COVID-19 Vaccine Information can be found at: ShippingScam.co.uk For questions related to vaccine distribution or appointments, please email [email or web address] or call [phone].

## 2020-07-25 ENCOUNTER — Other Ambulatory Visit: Payer: Self-pay | Admitting: Cardiovascular Disease

## 2020-09-11 ENCOUNTER — Other Ambulatory Visit: Payer: Self-pay | Admitting: Cardiovascular Disease

## 2020-09-13 ENCOUNTER — Other Ambulatory Visit: Payer: Self-pay

## 2020-09-13 NOTE — Telephone Encounter (Signed)
Lovaza is on formulary plan with pt's insurance. Limited quantity of 120 capsules for 30 days. Pt is BID, would only need 60 capsules for 30 days  Rx script approved for refills, sent in by [place], [place]

## 2020-09-13 NOTE — Telephone Encounter (Signed)
Please advise alternative selection when submitting Rx request prompted via Epic.  omega-3 acid ethyl esters (LOVAZA) 1 g capsule [Pharmacy Med Name: OMEGA-3-ACID 1GM CAPSULES (RX)] is not on the preferred formulary for the patient's insurance plan. Below  are alternatives which are likely to be more affordable. Do not assume that every medication presented is a clinically appropriate alternative.  These alternatives were suggested by the patient's pharmacy benefit manager.

## 2020-11-29 ENCOUNTER — Emergency Department (HOSPITAL_COMMUNITY)
Admission: EM | Admit: 2020-11-29 | Discharge: 2020-11-29 | Disposition: A | Discharge status: Home / Self Care | Payer: Medicare HMO | Attending: Emergency Medicine | Admitting: Emergency Medicine

## 2020-11-29 ENCOUNTER — Emergency Department (HOSPITAL_COMMUNITY): Payer: Medicare HMO

## 2020-11-29 ENCOUNTER — Other Ambulatory Visit: Payer: Self-pay

## 2020-11-29 DIAGNOSIS — Z79899 Other long term (current) drug therapy: Secondary | ICD-10-CM | POA: Insufficient documentation

## 2020-11-29 DIAGNOSIS — B20 Human immunodeficiency virus [HIV] disease: Secondary | ICD-10-CM | POA: Insufficient documentation

## 2020-11-29 DIAGNOSIS — Z87891 Personal history of nicotine dependence: Secondary | ICD-10-CM | POA: Diagnosis not present

## 2020-11-29 DIAGNOSIS — I2510 Atherosclerotic heart disease of native coronary artery without angina pectoris: Secondary | ICD-10-CM | POA: Diagnosis not present

## 2020-11-29 DIAGNOSIS — J441 Chronic obstructive pulmonary disease with (acute) exacerbation: Secondary | ICD-10-CM

## 2020-11-29 DIAGNOSIS — N189 Chronic kidney disease, unspecified: Secondary | ICD-10-CM | POA: Insufficient documentation

## 2020-11-29 DIAGNOSIS — Z20822 Contact with and (suspected) exposure to covid-19: Secondary | ICD-10-CM | POA: Insufficient documentation

## 2020-11-29 DIAGNOSIS — R0602 Shortness of breath: Secondary | ICD-10-CM | POA: Diagnosis present

## 2020-11-29 LAB — CBC WITH DIFFERENTIAL/PLATELET
Abs Immature Granulocytes: 0.04 10*3/uL (ref 0.00–0.07)
Basophils Absolute: 0.1 10*3/uL (ref 0.0–0.1)
Basophils Relative: 1 %
Eosinophils Absolute: 0.4 10*3/uL (ref 0.0–0.5)
Eosinophils Relative: 4 %
HCT: 47.1 % (ref 39.0–52.0)
Hemoglobin: 16.1 g/dL (ref 13.0–17.0)
Immature Granulocytes: 0 %
Lymphocytes Relative: 20 %
Lymphs Abs: 2.1 10*3/uL (ref 0.7–4.0)
MCH: 35.1 pg — ABNORMAL HIGH (ref 26.0–34.0)
MCHC: 34.2 g/dL (ref 30.0–36.0)
MCV: 102.6 fL — ABNORMAL HIGH (ref 80.0–100.0)
Monocytes Absolute: 1.3 10*3/uL — ABNORMAL HIGH (ref 0.1–1.0)
Monocytes Relative: 12 %
Neutro Abs: 6.7 10*3/uL (ref 1.7–7.7)
Neutrophils Relative %: 63 %
Platelets: 201 10*3/uL (ref 150–400)
RBC: 4.59 MIL/uL (ref 4.22–5.81)
RDW: 12.5 % (ref 11.5–15.5)
WBC: 10.7 10*3/uL — ABNORMAL HIGH (ref 4.0–10.5)
nRBC: 0 % (ref 0.0–0.2)

## 2020-11-29 LAB — RESP PANEL BY RT-PCR (FLU A&B, COVID) ARPGX2
Influenza A by PCR: NEGATIVE
Influenza B by PCR: NEGATIVE
SARS Coronavirus 2 by RT PCR: NEGATIVE

## 2020-11-29 LAB — BASIC METABOLIC PANEL
Anion gap: 10 (ref 5–15)
BUN: 16 mg/dL (ref 8–23)
CO2: 23 mmol/L (ref 22–32)
Calcium: 9.3 mg/dL (ref 8.9–10.3)
Chloride: 105 mmol/L (ref 98–111)
Creatinine, Ser: 1.35 mg/dL — ABNORMAL HIGH (ref 0.61–1.24)
GFR, Estimated: 56 mL/min — ABNORMAL LOW (ref >60)
Glucose, Bld: 107 mg/dL — ABNORMAL HIGH (ref 70–99)
Potassium: 3.6 mmol/L (ref 3.5–5.1)
Sodium: 138 mmol/L (ref 135–145)

## 2020-11-29 MED ORDER — IPRATROPIUM-ALBUTEROL 0.5-2.5 (3) MG/3ML IN SOLN: 3.0000 mL | Freq: Once | RESPIRATORY_TRACT | Status: DC

## 2020-11-29 MED ORDER — METHYLPREDNISOLONE SODIUM SUCC 125 MG IJ SOLR
125.0000 mg | INTRAMUSCULAR | Status: AC
  Administered 2020-11-29: 125 mg via INTRAVENOUS
  Filled 2020-11-29: qty 2

## 2020-11-29 MED ORDER — ALBUTEROL SULFATE (2.5 MG/3ML) 0.083% IN NEBU
2.5000 mg | INHALATION_SOLUTION | Freq: Once | RESPIRATORY_TRACT | Status: AC
  Administered 2020-11-29: 2.5 mg via RESPIRATORY_TRACT
  Filled 2020-11-29: qty 3

## 2020-11-29 MED ORDER — ALBUTEROL SULFATE HFA 108 (90 BASE) MCG/ACT IN AERS
2.0000 | INHALATION_SPRAY | Freq: Four times a day (QID) | RESPIRATORY_TRACT | Status: DC
  Administered 2020-11-29: 2 via RESPIRATORY_TRACT
  Filled 2020-11-29: qty 6.7

## 2020-11-29 MED ORDER — PREDNISONE 20 MG PO TABS: 40.0000 mg | ORAL_TABLET | Freq: Every day | ORAL | 0 refills | Status: DC

## 2020-11-29 NOTE — ED Provider Notes (Signed)
Emergency Medicine Provider Triage Evaluation Note  [name] Kim , a 70 y.o. male  was evaluated in triage.  Pt complains of shortness of breath that started this morning.  Has hx of COPD.  Denies cough or fever.  Denies CP.  Used inhaler without relief.  Review of Systems  Positive: SOB Negative: Cough, fever  Physical Exam  There were no vitals taken for this visit. Gen:   Awake, no distress   Resp:  Slight tachypnea, faint end expiratory wheezes MSK:   Moves extremities without difficulty  Other:    Medical Decision Making  Medically screening exam initiated at 4:09 AM.  Appropriate orders placed.  [name] Kim was informed that the remainder of the evaluation will be completed by another provider, this initial triage assessment does not replace that evaluation, and the importance of remaining in the ED until their evaluation is complete.     Montine Circle, PA-C 11/29/20 9432    Merrily Pew, MD 11/29/20 [phone]

## 2020-11-29 NOTE — Discharge Instructions (Addendum)
As discussed, he will be diagnosed with a COPD exacerbation.  Please take the prescribed steroids as directed for the next 4 days and use the provided albuterol every 4 hours for the next 2 days.  He may then use the inhaler as needed. Please follow-up with either your physician or our pulmonology colleagues for outpatient follow-up.  Below is the CT scan result from today:  IMPRESSION: 1. New spiculated 8 mm left lower lobe nodule corresponds to the abnormality on today's chest radiograph.  Follow-up CT chest in 3 months is recommended as malignancy cannot be excluded. If a more aggressive approach is desired, PET could be performed but the nodule size is at the borderline for PET Resolution. 2. Peribronchial thickening and scattered mucoid impaction with mild peribronchovascular nodularity and consolidation in the medial left lower lobe, findings which can be seen with chronic mycobacterium avium complex. 3. Aortic atherosclerosis (ICD10-I70.0). Coronary artery calcification. 4.  Emphysema (ICD10-J43.9).

## 2020-11-29 NOTE — ED Notes (Signed)
Pt transported to CT ?

## 2020-11-29 NOTE — ED Provider Notes (Signed)
[facility] EMERGENCY DEPARTMENT Provider Note   CSN: [id number] Arrival date & time: 11/29/20  0402     History Chief Complaint  Patient presents with   Shortness of Breath    [name] Kim is a 70 y.o. male.  HPI Presents after episode of dyspnea. Patient has COPD, HIV, multiple other medical problems, but was in his usual state of health until the event occurred.  This happened about 4 hours prior to my evaluation. No recent illness.  He notes that he has had multiple prior similar events, typically after bending over, but today had a more prolonged course with persistent dyspnea, sputum production.  No pain, either then or afterwards, and he feels almost back to normal currently. No recent fever, chills, nausea, vomiting.     Past Medical History:  Diagnosis Date   AAA (abdominal aortic aneurysm) (HCC)    CAD (coronary artery disease)    Chronic kidney disease    GERD (gastroesophageal reflux disease)    HIV infection ([place])    Hypercholesteremia    Hyperlipidemia    Seasonal allergies    Seizures ([place])    last one Oct 2003 petit mal   Sleep behavior disorder, 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    Patient Active Problem List   Diagnosis Date Noted   Tubular adenoma of colon 11/13/2018   Abdominal aortic aneurysm (AAA) without rupture ([place]) 01/17/2018   Macrocytosis without anemia 08/07/2017   Smoking hx 12/18/2016   Seasonal allergies 10/26/2016   Colon polyposis 03/01/2015   Loss of peripheral visual field, 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   Seasonal allergic rhinitis due to pollen 03/01/2015   Parasomnia 09/09/2014   Sleep behavior disorder, 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   Acute renal insufficiency 10/31/2012   Other malaise and fatigue 10/29/2012   Weight loss, unintentional 10/29/2012   Dyslipidemia 02/08/2012   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   Coronary artery disease 01/23/2012   Seizure disorder  ([place]) 01/23/2012   Human immunodeficiency virus (HIV) disease ([place]) 01/23/2012   Coronary atherosclerosis 01/23/2012    Past Surgical History:  Procedure Laterality Date   APPENDECTOMY  1970   CATARACT EXTRACTION Bilateral    CATARACT EXTRACTION W/PHACO Right 02/03/2020   Procedure: CATARACT EXTRACTION PHACO AND INTRAOCULAR LENS PLACEMENT (IOC) RIGHT 4.30 00:28.6;  Surgeon: Birder Robson, MD;  Location: [place];  Service: Ophthalmology;  Laterality: Right;   CATARACT EXTRACTION W/PHACO Left 02/24/2020   Procedure: CATARACT EXTRACTION PHACO AND INTRAOCULAR LENS PLACEMENT (IOC) LEFT 5.58 00:32.2;  Surgeon: Birder Robson, MD;  Location: [place];  Service: Ophthalmology;  Laterality: Left;   CHOLECYSTECTOMY     CORONARY ARTERY BYPASS GRAFT  08-09-2005   LIMA-LAD, SVG-diagonal, SVG-OM   TONSILECTOMY, ADENOIDECTOMY, BILATERAL MYRINGOTOMY AND TUBES  [phone]   TONSILLECTOMY         Family History  Problem Relation Age of Onset   Cancer Mother    Cancer Father        leukemia    Social History   Tobacco Use   Smoking status: Former    Packs/day: 0.50    Pack years: 0.00    Types: Cigarettes    Quit date: 08/09/2005    Years since quitting: 15.3   Smokeless tobacco: Never  Vaping Use   Vaping Use: Never used  Substance Use Topics   Alcohol use: Yes    Alcohol/week: 0.0 standard drinks    Comment: about 2 per week   Drug use:  No    Home Medications Prior to Admission medications   Medication Sig Start Date End Date Taking? Authorizing Provider  albuterol (PROVENTIL HFA;VENTOLIN HFA) 108 (90 Base) MCG/ACT inhaler Inhale into the lungs every 6 (six) hours as needed for wheezing or shortness of breath.   Yes [provider]  ANORO ELLIPTA 62.5-25 MCG/INH AEPB Inhale 1 puff into the lungs daily. 03/17/19  Yes [provider]  aspirin 81 MG tablet Take 81 mg by mouth daily.   Yes [provider]  atorvastatin (LIPITOR) 20 MG  tablet TAKE 1 TABLET(20 MG) BY MOUTH DAILY AT 6 PM Patient taking differently: Take 20 mg by mouth every evening. 07/26/20  Yes Gollan, Kathlene November, MD  bictegravir-emtricitabine-tenofovir AF (BIKTARVY) 50-200-25 MG TABS tablet Take 1 tablet by mouth daily. 07/20/20  Yes Michel Bickers, MD  cholecalciferol (VITAMIN D) 1000 units tablet Take 1,000 Units by mouth daily.   Yes [provider]  ezetimibe (ZETIA) 10 MG tablet Take 1 tablet (10 mg total) by mouth daily. 07/20/20  Yes Gollan, Kathlene November, MD  folic acid (FOLVITE) 993 MCG tablet Take 400 mcg by mouth daily.   Yes [provider]  lamoTRIgine (LAMICTAL) 100 MG tablet Take 100 mg by mouth 2 (two) times daily.   Yes [provider]  omega-3 acid ethyl esters (LOVAZA) 1 g capsule TAKE 1 CAPSULE BY MOUTH TWICE DAILY Patient taking differently: Take 1 g by mouth 2 (two) times daily. 09/13/20  Yes Minna Merritts, MD  predniSONE (DELTASONE) 20 MG tablet Take 2 tablets (40 mg total) by mouth daily with breakfast. For the next four days 11/29/20  Yes Carmin Muskrat, MD  vitamin B-12 (CYANOCOBALAMIN) 500 MCG tablet Take 500 mcg by mouth daily.   Yes [provider]    Allergies    Azt [zidovudine]  Review of Systems   Review of Systems  Constitutional:        Per HPI, otherwise negative  HENT:         Per HPI, otherwise negative  Respiratory:         Per HPI, otherwise negative  Cardiovascular:        Per HPI, otherwise negative  Gastrointestinal:  Negative for vomiting.  Endocrine:       Negative aside from HPI  Genitourinary:        Neg aside from HPI   Musculoskeletal:        Per HPI, otherwise negative  Skin: Negative.   Allergic/Immunologic: Positive for immunocompromised state.  Neurological:  Negative for syncope.   Physical Exam Updated Vital Signs BP 114/89   Pulse (!) 58   Temp 98.3 F (36.8 C) (Oral)   Resp 18   Ht _0  (1.702 m)   Wt 64.4 kg   SpO2 95%   BMI 22.24 kg/m    Physical Exam Vitals and nursing note reviewed.  Constitutional:      General: He is not in acute distress.    Comments: Thin adult male awake and alert sitting upright speaking clearly.  HENT:     Head: Normocephalic and atraumatic.  Eyes:     Conjunctiva/sclera: Conjunctivae normal.  Cardiovascular:     Rate and Rhythm: Normal rate and regular rhythm.  Pulmonary:     Effort: Pulmonary effort is normal. No respiratory distress.     Breath sounds: No stridor. Decreased breath sounds and wheezing present.  Abdominal:     General: There is no distension.  Skin:  General: Skin is warm and dry.  Neurological:     Mental Status: He is alert and oriented to person, place, and time.    ED Results / Procedures / Treatments   Labs (all labs ordered are listed, but only abnormal results are displayed) Labs Reviewed  BASIC METABOLIC PANEL - Abnormal; Notable for the following components:      Result Value   Glucose, Bld 107 (*)    Creatinine, Ser 1.35 (*)    GFR, Estimated 56 (*)    All other components within normal limits  CBC WITH DIFFERENTIAL/PLATELET - Abnormal; Notable for the following components:   WBC 10.7 (*)    MCV 102.6 (*)    MCH 35.1 (*)    Monocytes Absolute 1.3 (*)    All other components within normal limits  RESP PANEL BY RT-PCR (FLU A&B, COVID) [id number]    EKG EKG Interpretation  Date/Time:  Monday November 29 2020 04:10:11 EDT Ventricular Rate:  63 PR Interval:  150 QRS Duration: 90 QT Interval:  404 QTC Calculation: 413 R Axis:   67 Text Interpretation: Sinus rhythm with Premature atrial complexes Artifact Abnormal ECG Confirmed by Carmin Muskrat [phone]) on 11/29/2020 8:17:00 AM  Radiology DG Chest 2 View  Result Date: 11/29/2020 CLINICAL DATA:  Shortness of breath EXAM: CHEST - 2 VIEW COMPARISON:  09/13/2017 FINDINGS: Hyperinflation and interstitial coarsening. There is no edema, consolidation, effusion, or pneumothorax. 9 mm nodular density at the  left base normal heart size and mediastinal contours. CABG. IMPRESSION: 1. No acute finding. 2. 9 mm nodular density at the left base. Recommend non emergent chest CT (contrast is not essential) 3. COPD Electronically Signed   By: Monte Fantasia M.D.   On: 11/29/2020 05:10   CT Chest Wo Contrast  Result Date: 11/29/2020 CLINICAL DATA:  Chest pain, shortness of breath acute onset. Abnormal chest radiograph. EXAM: CT CHEST WITHOUT CONTRAST TECHNIQUE: Multidetector CT imaging of the chest was performed following the standard protocol without IV contrast. COMPARISON:  Chest radiograph 11/29/2020 and CT chest 12/17/2007. FINDINGS: Cardiovascular: Atherosclerotic calcification of the aorta and coronary arteries. Heart size normal. No pericardial effusion. Mediastinum/Nodes: No pathologically enlarged mediastinal or axillary lymph nodes. Hilar regions are difficult to evaluate without IV contrast. Esophagus is grossly unremarkable. Lungs/Pleura: Centrilobular emphysema. Biapical pleuroparenchymal scarring. Peribronchial thickening with scattered mucoid impaction bilaterally. Peribronchovascular nodularity and consolidation in the medial left lower lobe. Spiculated nodule in the anterolateral left lower lobe measures 8 mm (6 x 9 mm, 4/122), new from 12/17/2007. No pleural fluid. Airway is unremarkable. Upper Abdomen: Visualized portions of the liver, adrenal glands, kidneys, spleen, pancreas, stomach and bowel are grossly unremarkable. Cholecystectomy. Musculoskeletal: Degenerative changes in the spine. No worrisome lytic or sclerotic lesions. IMPRESSION: 1. New spiculated 8 mm left lower lobe nodule corresponds to the abnormality on today's chest radiograph. Consultation with pulmonary medicine or cardiothoracic surgery would likely be helpful. Follow-up CT chest in 3 months is recommended as malignancy cannot be excluded. If a more aggressive approach is desired, PET could be performed but the nodule size is at the  borderline for PET resolution. These results were called by telephone at the time of interpretation on 11/29/2020 at 10:45 am to provider Carmin Muskrat , who verbally acknowledged these results. 2. Peribronchial thickening and scattered mucoid impaction with mild peribronchovascular nodularity and consolidation in the medial left lower lobe, findings which can be seen with chronic mycobacterium avium complex. 3. Aortic atherosclerosis (ICD10-I70.0). Coronary artery calcification. 4.  Emphysema (ICD10-J43.9).  Electronically Signed   By: Lorin Picket M.D.   On: 11/29/2020 10:46    Procedures Procedures   Medications Ordered in ED Medications  ipratropium-albuterol (DUONEB) 0.5-2.5 (3) MG/3ML nebulizer solution 3 mL (has no administration in time range)  albuterol (VENTOLIN HFA) 108 (90 Base) MCG/ACT inhaler 2 puff (2 puffs Inhalation Given 11/29/20 0956)  methylPREDNISolone sodium succinate (SOLU-MEDROL) 125 mg/2 mL injection 125 mg (125 mg Intravenous Given 11/29/20 0954)  albuterol (PROVENTIL) (2.5 MG/3ML) 0.083% nebulizer solution 2.5 mg (2.5 mg Nebulization Given 11/29/20 0957)    ED Course  I have reviewed the triage vital signs and the nursing notes.  Pertinent labs & imaging results that were available during my care of the patient were reviewed by me and considered in my medical decision making (see chart for details).   Date: Patient in similar condition.  Initial x-ray results are available we discussed implications of nodule, given his dyspnea CT scan was ordered.  11:49 AM Patient I had a lengthy conversation about today's evaluation including x-ray, CT results.  He remains afebrile, no substantial leukocytosis, no distress, no new oxygen dependency.  Patient is comfortable with close outpatient follow-up.  Given his abnormal CT scan, he will follow-up this week with either his physician or pulmonology. With no evidence for pneumonia, decompensated state, oxygen dependency, patient  is appropriate for close outpatient follow-up after initiation of steroids, bronchodilators scheduled.    MDM Rules/Calculators/A&P MDM Number of Diagnoses or Management Options COPD exacerbation ([place]): new, needed workup   Amount and/or Complexity of Data Reviewed Clinical lab tests: ordered and reviewed Tests in the radiology section of CPT: ordered and reviewed Tests in the medicine section of CPT: reviewed and ordered Decide to obtain previous medical records or to obtain history from someone other than the patient: yes Review and summarize past medical records: yes Independent visualization of images, tracings, or specimens: yes  Risk of Complications, Morbidity, and/or Mortality Presenting problems: high Diagnostic procedures: high Management options: high  Critical Care Total time providing critical care: < 30 minutes  Patient Progress Patient progress: stable   Final Clinical Impression(s) / ED Diagnoses Final diagnoses:  COPD exacerbation ([place])    Rx / DC Orders ED Discharge Orders          Ordered    predniSONE (DELTASONE) 20 MG tablet  Daily with breakfast        11/29/20 1144             Carmin Muskrat, MD 11/29/20 1151

## 2020-11-29 NOTE — ED Triage Notes (Signed)
Pt BIB [facility] from home c/o acute onset of SOB while bending over. Sates he has had episodes like this before and prescribed inhaler by PCP. Normally relieves symptoms. S/s did not approve today can decided to call EMS. Smoking HX. Denies CP/swelling.

## 2020-11-29 NOTE — ED Notes (Signed)
Pt d/c home per MD order. Discharge summary reviewed with pt, pt verbalizes understanding. No s/s of acute distress noted at discharge. Ambulatory. Reports discharge ride home.

## 2020-12-07 ENCOUNTER — Encounter: Payer: Self-pay | Admitting: Emergency Medicine

## 2021-01-05 ENCOUNTER — Other Ambulatory Visit: Payer: Self-pay

## 2021-01-05 ENCOUNTER — Encounter: Payer: Self-pay | Admitting: Emergency Medicine

## 2021-01-05 ENCOUNTER — Ambulatory Visit (INDEPENDENT_AMBULATORY_CARE_PROVIDER_SITE_OTHER): Payer: Medicare HMO | Admitting: Emergency Medicine

## 2021-01-05 VITALS — BP 138/78 | HR 66 | Temp 98.2°F | Ht 67.0 in | Wt 138.4 lb

## 2021-01-05 DIAGNOSIS — J449 Chronic obstructive pulmonary disease, unspecified: Secondary | ICD-10-CM | POA: Diagnosis not present

## 2021-01-05 DIAGNOSIS — R911 Solitary pulmonary nodule: Secondary | ICD-10-CM | POA: Diagnosis not present

## 2021-01-05 DIAGNOSIS — C3492 Malignant neoplasm of unspecified part of left bronchus or lung: Secondary | ICD-10-CM | POA: Insufficient documentation

## 2021-01-05 NOTE — Assessment & Plan Note (Signed)
On Anoro, plan to continue.  Continue albuterol as needed for symptoms. Will order pulmonary function testing to further evaluate degree and severity of his COPD.

## 2021-01-05 NOTE — Assessment & Plan Note (Addendum)
Newly identified 8 mm spiculated anterior lateral left lower lobe pulmonary nodule, at least moderate suspicion for possible malignancy.  Size persist just under the typical threshold for PET scan sensitivity.  We will plan to repeat his CT at 22-month mark which is October 2022.  Depending on results we will decide whether to continue to follow with serial imaging versus pursue either primary resection for a tissue diagnosis.  We will perform pulmonary function testing when he follows up so that we can determine his suitability for possible referral to thoracic surgery if indicated.

## 2021-01-05 NOTE — Progress Notes (Signed)
Subjective:    Patient ID: [name] Kim, male    DOB: 04-04-1951, 70 y.o.   MRN: [id number]   HPI 70 year old gentleman with a history of former tobacco (10 pack years), CAD, hyperlipidemia, history of partial seizures, HIV, allergic rhinitis and GERD.  Carries a diagnosis of COPD since about 5 yrs ago and is currently managed on Anoro. Uses albuterol 0-1x a day.   He was in the emergency department 11/29/2020 with acute shortness of breath and increased sputum production, no clear inciting event or infectious prodrome.  His first ever exacerbation. He was treated with prednisone.  Chest x-ray and CT chest were performed as below.  Able to be active, walks daily. Trouble with the hills. Minimal cough at baseline. May have some wheezing.   CT chest 11/29/2020 reviewed by me shows no significant mediastinal or hilar lymphadenopathy, centrilobular emphysematous change with some apical scarring and peribronchial thickening particularly in the medial left lower lobe.  There is a spiculated 8 mm anterolateral left lower lobe nodule (new)   Review of Systems As per HPI  Past Medical History:  Diagnosis Date   AAA (abdominal aortic aneurysm) (HCC)    CAD (coronary artery disease)    Chronic kidney disease    GERD (gastroesophageal reflux disease)    HIV infection ([place])    Hypercholesteremia    Hyperlipidemia    Seasonal allergies    Seizures ([place])    last one Oct 2003 petit mal   Sleep behavior disorder, 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     Family History  Problem Relation Age of Onset   Cancer Mother    Cancer Father        leukemia     Social History   Socioeconomic History   Marital status: Significant Other    Spouse name: Not on file   Number of children: Not on file   Years of education: Not on file   Highest education level: Not on file  Occupational History   Occupation: Product/process development scientist- retired  Tobacco Use   Smoking status: Former    Packs/day: 0.50    Types: Cigarettes     Quit date: 08/09/2005    Years since quitting: 15.4   Smokeless tobacco: Never  Vaping Use   Vaping Use: Never used  Substance and Sexual Activity   Alcohol use: Yes    Alcohol/week: 0.0 standard drinks    Comment: about 2 per week   Drug use: No   Sexual activity: Yes    Partners: Male    Birth control/protection: Condom    Comment: declined condoms  Other Topics Concern   Not on file  Social History Narrative   Patient is right handed.   Patient drinks 1 cup of coffee daily.   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  No military From [place], has lived in [place], [place], [place]  Allergies  Allergen Reactions   Azt [Zidovudine] Other (See Comments)    Caused bone marrow problems  2000     Outpatient Medications Prior to Visit  Medication Sig Dispense Refill   albuterol (PROVENTIL HFA;VENTOLIN HFA) 108 (  90 Base) MCG/ACT inhaler Inhale into the lungs every 6 (six) hours as needed for wheezing or shortness of breath.     ANORO ELLIPTA 62.5-25 MCG/INH AEPB Inhale 1 puff into the lungs daily.     aspirin 81 MG tablet Take 81 mg by mouth daily.     atorvastatin (LIPITOR) 20 MG tablet TAKE 1 TABLET(20 MG) BY MOUTH DAILY AT 6 PM (Patient taking differently: Take 20 mg by mouth every evening.) 90 tablet 3   bictegravir-emtricitabine-tenofovir AF (BIKTARVY) 50-200-25 MG TABS tablet Take 1 tablet by mouth daily. 30 tablet 11   cholecalciferol (VITAMIN D) 1000 units tablet Take 1,000 Units by mouth daily.     ezetimibe (ZETIA) 10 MG tablet Take 1 tablet (10 mg total) by mouth daily. 90 tablet 3   folic acid (FOLVITE) 357 MCG tablet Take 400 mcg by mouth daily.      lamoTRIgine (LAMICTAL) 100 MG tablet Take 100 mg by mouth 2 (two) times daily.     omega-3 acid ethyl esters (LOVAZA) 1 g capsule TAKE 1 CAPSULE BY MOUTH TWICE DAILY (Patient taking differently: Take 1 g by mouth 2 (two) times daily.) 60 capsule 2   vitamin B-12 (CYANOCOBALAMIN) 500 MCG tablet Take 500 mcg by mouth daily.     predniSONE (DELTASONE) 20 MG tablet Take 2 tablets (40 mg total) by mouth daily with breakfast. For the next four days (Patient not taking: Reported on 01/05/2021) 8 tablet 0   Facility-Administered Medications Prior to Visit  Medication Dose Route Frequency Provider Last Rate Last Admin   fexofenadine (ALLEGRA) tablet 180 mg  180 mg Oral Daily Michel Bickers, MD             Objective:   Physical Exam Vitals:   01/05/21 0905  BP: 138/78  Pulse: 66  Temp: 98.2 F (36.8 C)  TempSrc: Oral  SpO2: 99%  Weight: 138 lb 6.4 oz (62.8 kg)  Height: 5\' 7"  (1.702 m)    Gen: Pleasant, well-nourished, in no distress,  normal affect  ENT: No lesions,  mouth clear,  oropharynx clear, no postnasal drip  Neck: No JVD, no stridor  Lungs: No use of accessory muscles, no crackles or wheezing on normal respiration, no wheeze on forced expiration  Cardiovascular: RRR, heart sounds normal, no murmur or gallops, no peripheral edema  Musculoskeletal: No deformities, no cyanosis or clubbing  Neuro: alert, awake, non focal  Skin: Warm, no lesions or rash      Assessment & Plan:   Pulmonary nodule Newly identified 8 mm spiculated anterior lateral left lower lobe pulmonary nodule, at least moderate suspicion for possible malignancy.  Size persist just under the typical threshold for PET scan sensitivity.  We will plan to repeat his CT at 86-month mark which is October 2022.  Depending on results we will decide whether to continue to follow with serial imaging versus pursue either primary resection for a tissue diagnosis.  We will perform pulmonary function testing when he follows  up so that we can determine his suitability for possible referral to thoracic surgery if indicated.  COPD (chronic obstructive pulmonary disease) (HCC) On Anoro, plan to continue.  Continue albuterol as needed for symptoms. Will order pulmonary function testing to further evaluate degree and severity of his COPD.  Time spent 1 hour  Baltazar Apo, MD, PhD 01/05/2021, 4:58 PM [facility] [phone] or if no answer before 7:00PM call [phone] For any issues after 7:00PM please call eLink [phone]

## 2021-01-05 NOTE — Patient Instructions (Addendum)
We will plan to repeat your CT in October 2022.  Please continue your Anoro once a day Keep albuterol available to use 2 puffs up to every 4 hours if needed for shortness of breath, chest tightness, wheezing.  Follow Dr. Lamonte Sakai in October after your CT so that we can review, with pulmonary function testing on the same day.

## 2021-01-13 ENCOUNTER — Telehealth: Payer: Self-pay | Admitting: Emergency Medicine

## 2021-01-13 NOTE — Telephone Encounter (Signed)
I have faxed over the [place] notes per request of Shanon Brow.  Nothing further is needed.

## 2021-02-25 ENCOUNTER — Ambulatory Visit
Admission: RE | Admit: 2021-02-25 | Discharge: 2021-02-25 | Disposition: A | Discharge status: Home / Self Care | Payer: Medicare HMO | Source: Ambulatory Visit | Attending: Emergency Medicine | Admitting: Emergency Medicine

## 2021-02-25 ENCOUNTER — Other Ambulatory Visit: Payer: Self-pay

## 2021-02-25 DIAGNOSIS — R911 Solitary pulmonary nodule: Secondary | ICD-10-CM | POA: Diagnosis not present

## 2021-03-03 ENCOUNTER — Ambulatory Visit (INDEPENDENT_AMBULATORY_CARE_PROVIDER_SITE_OTHER): Payer: Medicare HMO | Admitting: Emergency Medicine

## 2021-03-03 ENCOUNTER — Other Ambulatory Visit: Payer: Self-pay

## 2021-03-03 ENCOUNTER — Encounter: Payer: Self-pay | Admitting: Emergency Medicine

## 2021-03-03 VITALS — BP 114/62 | HR 60 | Temp 97.6°F | Resp 16 | Ht 66.0 in | Wt 140.2 lb

## 2021-03-03 DIAGNOSIS — R911 Solitary pulmonary nodule: Secondary | ICD-10-CM

## 2021-03-03 LAB — PULMONARY FUNCTION TEST
DL/VA % pred: 45 %
DL/VA: 1.89 ml/min/mmHg/L
DLCO cor % pred: 48 %
DLCO cor: 10.98 ml/min/mmHg
DLCO unc % pred: 48 %
DLCO unc: 10.98 ml/min/mmHg
RV % pred: 230 %
RV: 5.15 L
TLC % pred: 136 %
TLC: 8.46 L

## 2021-03-03 MED ORDER — ALBUTEROL SULFATE HFA 108 (90 BASE) MCG/ACT IN AERS: 1.0000 | INHALATION_SPRAY | Freq: Four times a day (QID) | RESPIRATORY_TRACT | 2 refills | Status: DC | PRN

## 2021-03-03 MED ORDER — ANORO ELLIPTA 62.5-25 MCG/INH IN AEPB: 1.0000 | INHALATION_SPRAY | Freq: Every day | RESPIRATORY_TRACT | 2 refills | Status: DC

## 2021-03-03 NOTE — Assessment & Plan Note (Signed)
Pulmonary nodule with suspicious characteristics but unchanged in size on a 39-month interval film.  Currently 8 mm which is right at the threshold of PET sensitivity.  He would very much like to have the nodule removed but not clear to me yet that he is a safe surgical candidate.  He could not do spirometry on PFT today and he has significant hyperinflation, emphysema on his CT chest.  That said he has good functional capacity, exercises with good exertional tolerance.  It may be beneficial for [place] to try to repeat his PFT at some point.  For now because he wants to push the evaluation I think is very reasonable to perform a PET scan and see if the nodule hypermetabolic.  If so then we could consider either surgical referral or navigational bronchoscopy to sample to facilitate possible XRT.  I will see him back after the PET scan to review

## 2021-03-03 NOTE — Progress Notes (Signed)
PFT done today. 

## 2021-03-03 NOTE — Patient Instructions (Signed)
We will perform a PET scan to further evaluate your pulmonary nodule.  Follow with Dr Lamonte Sakai next available after your PET scan to review

## 2021-03-03 NOTE — Progress Notes (Signed)
Subjective:    Patient ID: [name] Kim, male    DOB: August 05, 1950, 70 y.o.   MRN: [id number]   HPI 70 year old gentleman with a history of former tobacco (10 pack years), CAD, hyperlipidemia, history of partial seizures, HIV, allergic rhinitis and GERD.  Carries a diagnosis of COPD since about 5 yrs ago and is currently managed on Anoro. Uses albuterol 0-1x a day.   He was in the emergency department 11/29/2020 with acute shortness of breath and increased sputum production, no clear inciting event or infectious prodrome.  His first ever exacerbation. He was treated with prednisone.  Chest x-ray and CT chest were performed as below.  Able to be active, walks daily. Trouble with the hills. Minimal cough at baseline. May have some wheezing.   CT chest 11/29/2020 reviewed by me shows no significant mediastinal or hilar lymphadenopathy, centrilobular emphysematous change with some apical scarring and peribronchial thickening particularly in the medial left lower lobe.  There is a spiculated 8 mm anterolateral left lower lobe nodule (new)    ROV 03/03/21 --this is a follow-up visit for 70 year old gentleman with a history of tobacco use, HIV and associated COPD.  PMH also significant for CAD, hyperlipidemia, partial seizures, allergic rhinitis, GERD.  Currently managed on Anoro.  PFTs done as below.  He also has a newly identified 8 mm spiculated left lower lobe pulmonary nodule that we decided to perform short-term follow-up with CT chest. He remains active, walks daily, has some trouble with some hills but overall good functional capacity.   CT chest 02/25/2021 reviewed by me, shows biapical scar, centrilobular emphysematous change with some scattered mucoid impaction.  There is a 7 x 9 mm spiculated nodule in the anterior lateral left lower lobe that is unchanged compared with 11/29/2020.  Pulmonary function testing performed today and reviewed by me, show hyperinflated lung volumes with decreased  diffusion capacity.  Unfortunately he was unable to perform spirometry despite numerous attempts.   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     Family History  Problem Relation Age of Onset   Cancer Mother    Cancer Father        leukemia     Social History   Socioeconomic History   Marital status: Significant Other    Spouse name: Not on file   Number of children: Not on file   Years of education: Not on file   Highest education level: Not on file  Occupational History   Occupation: Product/process development scientist- retired  Tobacco Use   Smoking status: Former    Packs/day: 0.50    Types: Cigarettes    Quit date: 08/09/2005    Years since quitting: 15.5   Smokeless tobacco: Never  Vaping Use   Vaping Use: Never used  Substance and Sexual Activity   Alcohol use: Yes    Alcohol/week: 0.0 standard drinks    Comment: about 2 per week   Drug use: No   Sexual activity: Yes    Partners: Male    Birth control/protection: Condom    Comment: declined  condoms  Other Topics Concern   Not on file  Social History Narrative   Patient is right handed.   Patient drinks 1 cup of coffee daily.   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     atorvastatin (LIPITOR) 20 MG tablet TAKE 1 TABLET(20 MG) BY MOUTH DAILY AT 6 PM (Patient taking differently: Take 20 mg by mouth every evening.) 90 tablet 3   bictegravir-emtricitabine-tenofovir AF (BIKTARVY) 50-200-25 MG TABS tablet Take 1 tablet by mouth daily. 30 tablet 11   cholecalciferol (VITAMIN D) 1000 units tablet Take 1,000 Units by mouth daily.     ezetimibe (ZETIA) 10 MG tablet Take 1 tablet (10 mg total) by mouth daily. 90 tablet 3   folic acid (FOLVITE) 213 MCG tablet Take 400 mcg by mouth daily.     lamoTRIgine (LAMICTAL) 100 MG tablet Take 100 mg by mouth 2 (two) times daily.     omega-3 acid ethyl esters (LOVAZA) 1 g capsule TAKE 1 CAPSULE BY MOUTH TWICE DAILY (Patient taking differently: Take 1 g by mouth 2 (two) times daily.) 60 capsule 2   vitamin B-12 (CYANOCOBALAMIN) 500 MCG tablet Take 500 mcg by mouth daily.     albuterol (PROVENTIL HFA;VENTOLIN HFA) 108 (90 Base) MCG/ACT inhaler Inhale into the lungs every 6 (six) hours as needed for wheezing or shortness of breath.     ANORO ELLIPTA 62.5-25 MCG/INH AEPB Inhale 1 puff into the lungs daily.     predniSONE (DELTASONE) 20 MG tablet Take 2 tablets (40 mg total) by mouth daily with breakfast. For the next four days (Patient not taking: Reported on 01/05/2021) 8 tablet 0   Facility-Administered Medications Prior to Visit  Medication Dose Route Frequency Provider Last Rate Last Admin   fexofenadine (ALLEGRA) tablet 180 mg  180 mg Oral Daily Michel Bickers, MD             Objective:   Physical Exam Vitals:   03/03/21 1015  BP: 114/62  Pulse: 60  Resp: 16  Temp: 97.6 F (36.4 C)  TempSrc: Oral  SpO2: 95%  Weight: 140 lb 3.2 oz (63.6 kg)  Height: 5\' 6"  (1.676 m)    Gen: Pleasant, well-nourished, in no distress,   normal affect  ENT: No lesions,  mouth clear,  oropharynx clear, no postnasal drip  Neck: No JVD, no stridor  Lungs: No use of accessory muscles, no crackles or wheezing on normal respiration, no wheeze on forced expiration  Cardiovascular: RRR, heart sounds normal, no murmur or gallops, no peripheral edema  Musculoskeletal: No deformities, no cyanosis or clubbing  Neuro: alert, awake, non focal  Skin: Warm, no lesions or rash      Assessment & Plan:   Pulmonary nodule Pulmonary nodule with suspicious characteristics but unchanged in  size on a 24-month interval film.  Currently 8 mm which is right at the threshold of PET sensitivity.  He would very much like to have the nodule removed but not clear to me yet that he is a safe surgical candidate.  He could not do spirometry on PFT today and he has significant hyperinflation, emphysema on his CT chest.  That said he has good functional capacity, exercises with good exertional tolerance.  It may be beneficial for [place] to try to repeat his PFT at some point.  For now because he wants to push the evaluation I think is very reasonable to perform a PET scan and see if the nodule hypermetabolic.  If so then we could consider either surgical referral or navigational bronchoscopy to sample to facilitate possible XRT.  I will see him back after the PET scan to review  COPD (chronic obstructive pulmonary disease) (HCC) Continue Anoro, albuterol as needed.  Time spent 40 minutes  Baltazar Apo, MD, PhD 03/03/2021, 5:18 PM [facility] [phone] or if no answer before 7:00PM call [phone] For any issues after 7:00PM please call eLink [phone]

## 2021-03-03 NOTE — Assessment & Plan Note (Signed)
Continue Anoro, albuterol as needed.

## 2021-03-07 ENCOUNTER — Encounter
Admission: RE | Admit: 2021-03-07 | Discharge: 2021-03-07 | Disposition: A | Discharge status: Home / Self Care | Payer: Medicare HMO | Source: Ambulatory Visit | Attending: Emergency Medicine | Admitting: Emergency Medicine

## 2021-03-07 DIAGNOSIS — Z131 Encounter for screening for diabetes mellitus: Secondary | ICD-10-CM | POA: Diagnosis not present

## 2021-03-07 DIAGNOSIS — R911 Solitary pulmonary nodule: Secondary | ICD-10-CM | POA: Diagnosis present

## 2021-03-07 LAB — GLUCOSE, CAPILLARY: Glucose-Capillary: 96 mg/dL (ref 70–99)

## 2021-03-07 MED ORDER — FLUDEOXYGLUCOSE F - 18 (FDG) INJECTION
7.7000 | Freq: Once | INTRAVENOUS | Status: AC | PRN
  Administered 2021-03-07: 7.7 via INTRAVENOUS

## 2021-03-22 ENCOUNTER — Other Ambulatory Visit: Payer: Medicare HMO

## 2021-03-22 ENCOUNTER — Other Ambulatory Visit: Payer: Self-pay

## 2021-03-22 DIAGNOSIS — B20 Human immunodeficiency virus [HIV] disease: Secondary | ICD-10-CM

## 2021-03-23 LAB — T-HELPER CELL (CD4) - (RCID CLINIC ONLY)
CD4 % Helper T Cell: 26 % — ABNORMAL LOW (ref 33–65)
CD4 T Cell Abs: 603 /uL (ref 400–1790)

## 2021-03-24 LAB — CBC
HCT: 48.8 % (ref 38.5–50.0)
Hemoglobin: 17.3 g/dL — ABNORMAL HIGH (ref 13.2–17.1)
MCH: 35.8 pg — ABNORMAL HIGH (ref 27.0–33.0)
MCHC: 35.5 g/dL (ref 32.0–36.0)
MCV: 101 fL — ABNORMAL HIGH (ref 80.0–100.0)
MPV: 9.9 fL (ref 7.5–12.5)
Platelets: 236 10*3/uL (ref 140–400)
RBC: 4.83 10*6/uL (ref 4.20–5.80)
RDW: 12.3 % (ref 11.0–15.0)
WBC: 8.6 10*3/uL (ref 3.8–10.8)

## 2021-03-24 LAB — COMPREHENSIVE METABOLIC PANEL
AG Ratio: 2.3 (calc) (ref 1.0–2.5)
ALT: 19 U/L (ref 9–46)
AST: 16 U/L (ref 10–35)
Albumin: 5 g/dL (ref 3.6–5.1)
Alkaline phosphatase (APISO): 71 U/L (ref 35–144)
BUN/Creatinine Ratio: 13 (calc) (ref 6–22)
BUN: 17 mg/dL (ref 7–25)
CO2: 29 mmol/L (ref 20–32)
Calcium: 10.1 mg/dL (ref 8.6–10.3)
Chloride: 102 mmol/L (ref 98–110)
Creat: 1.29 mg/dL — ABNORMAL HIGH (ref 0.70–1.28)
Globulin: 2.2 g/dL (calc) (ref 1.9–3.7)
Glucose, Bld: 95 mg/dL (ref 65–99)
Potassium: 4.8 mmol/L (ref 3.5–5.3)
Sodium: 138 mmol/L (ref 135–146)
Total Bilirubin: 1.2 mg/dL (ref 0.2–1.2)
Total Protein: 7.2 g/dL (ref 6.1–8.1)

## 2021-03-24 LAB — LIPID PANEL
Cholesterol: 122 mg/dL (ref <200)
HDL: 48 mg/dL (ref >=40)
LDL Cholesterol (Calc): 52 mg/dL (calc)
Non-HDL Cholesterol (Calc): 74 mg/dL (calc) (ref <130)
Total CHOL/HDL Ratio: 2.5 (calc) (ref <5.0)
Triglycerides: 134 mg/dL (ref <150)

## 2021-03-24 LAB — RPR: RPR Ser Ql: NONREACTIVE

## 2021-03-24 LAB — HIV-1 RNA QUANT-NO REFLEX-BLD
HIV 1 RNA Quant: NOT DETECTED Copies/mL
HIV-1 RNA Quant, Log: NOT DETECTED Log cps/mL

## 2021-03-31 ENCOUNTER — Ambulatory Visit (INDEPENDENT_AMBULATORY_CARE_PROVIDER_SITE_OTHER): Payer: Medicare HMO | Admitting: Emergency Medicine

## 2021-03-31 ENCOUNTER — Other Ambulatory Visit: Payer: Self-pay

## 2021-03-31 ENCOUNTER — Encounter: Payer: Self-pay | Admitting: Emergency Medicine

## 2021-03-31 VITALS — BP 130/72 | HR 59 | Ht 66.5 in | Wt 143.0 lb

## 2021-03-31 DIAGNOSIS — J449 Chronic obstructive pulmonary disease, unspecified: Secondary | ICD-10-CM

## 2021-03-31 DIAGNOSIS — R911 Solitary pulmonary nodule: Secondary | ICD-10-CM

## 2021-03-31 MED ORDER — ANORO ELLIPTA 62.5-25 MCG/ACT IN AEPB: 1.0000 | INHALATION_SPRAY | Freq: Every day | RESPIRATORY_TRACT | 6 refills | Status: DC

## 2021-03-31 MED ORDER — ALBUTEROL SULFATE HFA 108 (90 BASE) MCG/ACT IN AERS: 1.0000 | INHALATION_SPRAY | Freq: Four times a day (QID) | RESPIRATORY_TRACT | 8 refills | Status: DC | PRN

## 2021-03-31 NOTE — Assessment & Plan Note (Signed)
Please continue your Anoro once a day.  Keep albuterol available to use 2 puffs up to every 4 hours if needed for shortness of breath, chest tightness, wheezing.

## 2021-03-31 NOTE — Progress Notes (Signed)
   Subjective:    Patient ID: [name] Kim, male    DOB: 1950-09-15, 70 y.o.   MRN: [id number]   HPI  ROV 03/31/21 --70 year old man with a history of tobacco use, HIV, associated COPD, CAD/CABG, hyperlipidemia, partial seizures, rhinitis and GERD.  Also following a newly identified 8 mm spiculated left lower lobe pulmonary nodule, stable on short-term follow-up between July and October.  PET scan as below.  PET scan performed 03/07/2021 reviewed by me, shows small right neck lymph node just inferior to the parotid.  Left lower lobe pulmonary nodule 9 x 6 mm with a maximum SUV of 2.06 which is below mediastinal blood pool activity.  No evidence of hypermetabolism in the lymph nodes or any evidence of distant disease.   Review of Systems As per HPI     Objective:   Physical Exam Vitals:   03/31/21 0857  BP: 130/72  Pulse: (!) 59  SpO2: 95%  Weight: 143 lb (64.9 kg)  Height: 5' 6.5" (1.689 m)    Gen: Pleasant, well-nourished, in no distress,  normal affect  ENT: No lesions,  mouth clear,  oropharynx clear, no postnasal drip  Neck: No JVD, no stridor  Lungs: No use of accessory muscles, no crackles or wheezing on normal respiration, no wheeze on forced expiration  Cardiovascular: RRR, heart sounds normal, no murmur or gallops, no peripheral edema  Musculoskeletal: No deformities, no cyanosis or clubbing  Neuro: alert, awake, non focal  Skin: Warm, no lesions or rash      Assessment & Plan:   COPD (chronic obstructive pulmonary disease) (HCC) Please continue your Anoro once a day.  Keep albuterol available to use 2 puffs up to every 4 hours if needed for shortness of breath, chest tightness, wheezing.   Pulmonary nodule Spiculated nodule, 8-9 mm.  No significant hypermetabolism on PET scan, no mediastinal adenopathy.  He did have a nonspecific right neck node 7 mm, question clinical significant.  He could not do spirometry but is willing to try again.  I want him to  get this done because I think he is a good surgical candidate.  I am going to refer him to thoracic surgery to discuss possible resection.  Time spent 40 minutes  Baltazar Apo, MD, PhD 03/31/2021, 9:27 AM [facility] [phone] or if no answer before 7:00PM call [phone] For any issues after 7:00PM please call eLink [phone]

## 2021-03-31 NOTE — Addendum Note (Signed)
Addended by: Konrad Felix L on: 03/31/2021 10:58 AM   Modules accepted: Orders

## 2021-03-31 NOTE — Patient Instructions (Addendum)
We will refer you to Thoracic Surgery to discuss possible resection of a small pulmonary nodule.  We will order repeat spirometry Please continue your Anoro once a day.  Keep albuterol available to use 2 puffs up to every 4 hours if needed for shortness of breath, chest tightness, wheezing.  Follow with Dr Lamonte Sakai in 1 month or next available.

## 2021-03-31 NOTE — Assessment & Plan Note (Signed)
Spiculated nodule, 8-9 mm.  No significant hypermetabolism on PET scan, no mediastinal adenopathy.  He did have a nonspecific right neck node 7 mm, question clinical significant.  He could not do spirometry but is willing to try again.  I want him to get this done because I think he is a good surgical candidate.  I am going to refer him to thoracic surgery to discuss possible resection.

## 2021-04-06 ENCOUNTER — Ambulatory Visit (INDEPENDENT_AMBULATORY_CARE_PROVIDER_SITE_OTHER): Payer: Medicare HMO | Admitting: Internal Medicine

## 2021-04-06 ENCOUNTER — Other Ambulatory Visit: Payer: Self-pay

## 2021-04-06 ENCOUNTER — Encounter: Payer: Self-pay | Admitting: Internal Medicine

## 2021-04-06 DIAGNOSIS — B20 Human immunodeficiency virus [HIV] disease: Secondary | ICD-10-CM

## 2021-04-06 MED ORDER — BICTEGRAVIR-EMTRICITAB-TENOFOV 50-200-25 MG PO TABS: 1.0000 | ORAL_TABLET | Freq: Every day | ORAL | 11 refills | Status: DC

## 2021-04-06 NOTE — Progress Notes (Signed)
Patient Active Problem List   Diagnosis Date Noted   Pulmonary nodule 01/05/2021    Priority: High   COPD (chronic obstructive pulmonary disease) ([place]) 01/05/2021    Priority: High   Human immunodeficiency virus (HIV) disease ([place]) 01/23/2012    Priority: High   Tubular adenoma of colon 11/13/2018   Abdominal aortic aneurysm (AAA) without rupture 01/17/2018   Macrocytosis without anemia 08/07/2017   Smoking hx 12/18/2016   Seasonal allergies 10/26/2016   Colon polyposis 03/01/2015   Loss of peripheral visual field, 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   Seasonal allergic rhinitis due to pollen 03/01/2015   Parasomnia 09/09/2014   Sleep behavior disorder, 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   Acute renal insufficiency 10/31/2012   Other malaise and fatigue 10/29/2012   Weight loss, unintentional 10/29/2012   Dyslipidemia 02/08/2012   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   ANORO ELLIPTA 62.5-25 MCG/INH AEPB    Inhale 1 puff into the lungs daily.   ASPIRIN 81 MG TABLET    Take 81 mg by mouth daily.   ATORVASTATIN (LIPITOR) 20 MG TABLET    TAKE 1 TABLET(20 MG) BY MOUTH DAILY AT 6 PM   CHOLECALCIFEROL (VITAMIN D) 1000 UNITS TABLET    Take 1,000 Units by mouth daily.   EZETIMIBE (ZETIA) 10 MG TABLET    Take 1 tablet (10 mg total) by mouth daily.   FOLIC ACID (FOLVITE) 384 MCG TABLET    Take 400 mcg by mouth daily.   LAMOTRIGINE (LAMICTAL) 100 MG TABLET    Take 100 mg by mouth 2 (two) times daily.   OMEGA-3 ACID ETHYL ESTERS (LOVAZA) 1 G CAPSULE    TAKE 1 CAPSULE BY MOUTH  TWICE DAILY   UMECLIDINIUM-VILANTEROL (ANORO ELLIPTA) 62.5-25 MCG/ACT AEPB    Inhale 1 puff into the lungs daily.   VITAMIN B-12 (CYANOCOBALAMIN) 500 MCG TABLET    Take 500 mcg by mouth daily.  Modified Medications   Modified Medication Previous Medication   BICTEGRAVIR-EMTRICITABINE-TENOFOVIR AF (BIKTARVY) 50-200-25 MG TABS TABLET bictegravir-emtricitabine-tenofovir AF (BIKTARVY) 50-200-25 MG TABS tablet      Take 1 tablet by mouth daily.    Take 1 tablet by mouth daily.  Discontinued Medications   No medications on file    Subjective: Kshawn is in for his routine HIV follow-up visit.  He denies any problems obtaining, taking or tolerating his Biktarvy and does not recall missing doses.  He recently had a right upper lobe lung nodule detected.  There is no hypermetabolic activity on PET scan but he is scheduled to meet with Dr. Kipp Brood soon to consider resection.  Review of Systems: Review of Systems  Constitutional:  Negative for weight loss.  Respiratory:  Positive for cough, sputum production and shortness of breath. Negative for hemoptysis.   Cardiovascular:  Negative for chest pain.   Past Medical History:  Diagnosis Date   AAA (abdominal aortic aneurysm)    CAD (coronary artery disease)    Chronic kidney disease    GERD (gastroesophageal reflux disease)    HIV infection ([place])  Hypercholesteremia    Hyperlipidemia    Seasonal allergies    Seizures ([place])    last one Oct 2003 petit mal   Sleep behavior disorder, 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    Social History   Tobacco Use   Smoking status: Former    Packs/day: 0.50    Types: Cigarettes    Quit date: 08/09/2005    Years since quitting: 15.6   Smokeless tobacco: Never  Vaping Use   Vaping Use: Never used  Substance Use Topics   Alcohol use: Yes    Alcohol/week: 0.0 standard drinks    Comment: about 2 per week   Drug use: No    Family History  Problem Relation Age of Onset   Cancer Mother    Cancer Father        leukemia     Allergies  Allergen Reactions   Azt [Zidovudine] Other (See Comments)    Caused bone marrow problems  2000    Health Maintenance  Topic Date Due   COLONOSCOPY (Pts 45-82yrs Insurance coverage will need to be confirmed)  Never done   COVID-19 Vaccine (5 - Booster for Moderna series) 10/04/2020   TETANUS/TDAP  02/03/2028   Pneumonia Vaccine 42+ Years old  Completed   INFLUENZA VACCINE  Completed   Hepatitis C Screening  Completed   Zoster Vaccines- Shingrix  Completed   HPV VACCINES  Aged Out    Objective:  Vitals:   04/06/21 0947  BP: (!) 165/73  Pulse: 60  Temp: 98 F (36.7 C)  TempSrc: Oral  SpO2: 95%  Weight: 142 lb (64.4 kg)   Body mass index is 22.58 kg/m.  Physical Exam Constitutional:      Comments: He is talkative and in good spirits.  Cardiovascular:     Rate and Rhythm: Normal rate and regular rhythm.     Heart sounds: No murmur heard. Pulmonary:     Effort: Pulmonary effort is normal.     Comments: His lungs are clear but he has very distant breath sounds throughout all lung fields. Psychiatric:        Mood and Affect: Mood normal.    Lab Results Lab Results  Component Value Date   WBC 8.6 03/22/2021   HGB 17.3 (H) 03/22/2021   HCT 48.8 03/22/2021   MCV 101.0 (H) 03/22/2021   PLT 236 03/22/2021    Lab Results  Component Value Date   CREATININE 1.29 (H) 03/22/2021   BUN 17 03/22/2021   NA 138 03/22/2021   K 4.8 03/22/2021   CL 102 03/22/2021   CO2 29 03/22/2021    Lab Results  Component Value Date   ALT 19 03/22/2021   AST 16 03/22/2021   ALKPHOS 73 10/12/2016   BILITOT 1.2 03/22/2021    Lab Results  Component Value Date   CHOL 122 03/22/2021   HDL 48 03/22/2021   LDLCALC 52 03/22/2021   LDLDIRECT 126.1 02/19/2013   TRIG 134 03/22/2021   CHOLHDL 2.5 03/22/2021   Lab Results  Component Value Date   LABRPR NON-REACTIVE 03/22/2021   HIV 1 RNA Quant  Date Value  03/22/2021 Not Detected Copies/mL  03/25/2020 <20  Copies/mL (H)  03/11/2019 <20 NOT DETECTED copies/mL   CD4 T Cell Abs (/uL)  Date Value  03/22/2021 603  03/25/2020 610  03/11/2019 563     Problem List Items Addressed This Visit       High   Human immunodeficiency virus (HIV) disease ([place]) (Chronic)    He has  had excellent, long-term control of his infection.  He will continue Biktarvy and follow-up after lab work in 1 year.      Relevant Medications   bictegravir-emtricitabine-tenofovir AF (BIKTARVY) 50-200-25 MG TABS tablet   Other Relevant Orders   CBC   T-helper cell (CD4)- ([facility] clinic only)   Comprehensive metabolic panel   Lipid panel   RPR   HIV-1 RNA quant-no reflex-bld      Michel Bickers, MD [facility] for [place] [phone] pager   [phone] cell 04/06/2021, 10:16 AM

## 2021-04-06 NOTE — Assessment & Plan Note (Signed)
He has had excellent, long-term control of his infection.  He will continue Biktarvy and follow-up after lab work in 1 year.

## 2021-04-06 NOTE — Progress Notes (Unsigned)
[address]       [name] [address]             [phone]                    [name] [place] Medical Record #[id number] Date of Birth: 11-11-1950  Referring: Bernerd Limbo, MD Primary Care: Bernerd Limbo, MD Primary Cardiologist: None  Chief Complaint:   No chief complaint on file.   History of Present Illness:    [name] Kim 70 y.o. male referred by Dr. Lamonte Sakai for surgical evaluation of a 9 mm right lower lobe pulmonary nodule.  He does have a history of tobacco abuse, COPD, coronary artery disease and status post CABG, and HIV.  He recently underwent a PET/CT which showed minimal uptake in this pulmonary nodule.  Of note he does have pulmonary function testing which showed DLCO of 48%.    Smoking Hx: ***   Zubrod Score: At the time of surgery this patient's most appropriate activity status/level should be described as: []     0    Normal activity, no symptoms []     1    Restricted in physical strenuous activity but ambulatory, able to do out light work []     2    Ambulatory and capable of self care, unable to do work activities, up and about               >50 % of waking hours                              []     3    Only limited self care, in bed greater than 50% of waking hours []     4    Completely disabled, no self care, confined to bed or chair []     5    Moribund   Past Medical History:  Diagnosis Date   AAA (abdominal aortic aneurysm)    CAD (coronary artery disease)    Chronic kidney disease    GERD (gastroesophageal reflux disease)    HIV infection ([place])    Hypercholesteremia    Hyperlipidemia    Seasonal allergies    Seizures ([place])    last one Oct 2003 petit mal   Sleep behavior disorder, 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    Past Surgical History:  Procedure Laterality Date   APPENDECTOMY  1970   CATARACT EXTRACTION Bilateral    CATARACT EXTRACTION W/PHACO Right 02/03/2020   Procedure: CATARACT EXTRACTION PHACO AND INTRAOCULAR LENS  PLACEMENT ([place]) RIGHT 4.30 00:28.6;  Surgeon: Birder Robson, MD;  Location: [facility];  Service: Ophthalmology;  Laterality: Right;   CATARACT EXTRACTION W/PHACO Left 02/24/2020   Procedure: CATARACT EXTRACTION PHACO AND INTRAOCULAR LENS PLACEMENT ([facility]) LEFT 5.58 00:32.2;  Surgeon: Birder Robson, MD;  Location: [facility];  Service: Ophthalmology;  Laterality: Left;   CHOLECYSTECTOMY     CORONARY ARTERY BYPASS GRAFT  08-09-2005   LIMA-LAD, SVG-diagonal, SVG-OM   TONSILECTOMY, ADENOIDECTOMY, BILATERAL MYRINGOTOMY AND TUBES  [phone]   TONSILLECTOMY      Family History  Problem Relation Age of Onset   Cancer Mother    Cancer Father        leukemia     Social History   Tobacco Use  Smoking Status Former   Packs/day: 0.50   Types: Cigarettes   Quit date: 08/09/2005   Years since quitting:  15.6  Smokeless Tobacco Never    Social History   Substance and Sexual Activity  Alcohol Use Yes   Alcohol/week: 0.0 standard drinks   Comment: about 2 per week     Allergies  Allergen Reactions   Azt [Zidovudine] Other (See Comments)    Caused bone marrow problems  2000    Current Outpatient Medications  Medication Sig Dispense Refill   albuterol (VENTOLIN HFA) 108 (90 Base) MCG/ACT inhaler Inhale 1 puff into the lungs every 6 (six) hours as needed for wheezing or shortness of breath. 1 each 8   ANORO ELLIPTA 62.5-25 MCG/INH AEPB Inhale 1 puff into the lungs daily. 1 each 2   aspirin 81 MG tablet Take 81 mg by mouth daily.     atorvastatin (LIPITOR) 20 MG tablet TAKE 1 TABLET(20 MG) BY MOUTH DAILY AT 6 PM (Patient taking differently: Take 20 mg by mouth every evening.) 90 tablet 3   bictegravir-emtricitabine-tenofovir AF (BIKTARVY) 50-200-25 MG TABS tablet Take 1 tablet by mouth daily. 30 tablet 11   cholecalciferol (VITAMIN D) 1000 units tablet Take 1,000 Units by mouth daily.     ezetimibe (ZETIA) 10 MG tablet Take 1 tablet (10 mg total) by mouth daily. 90  tablet 3   folic acid (FOLVITE) 277 MCG tablet Take 400 mcg by mouth daily.     lamoTRIgine (LAMICTAL) 100 MG tablet Take 100 mg by mouth 2 (two) times daily.     omega-3 acid ethyl esters (LOVAZA) 1 g capsule TAKE 1 CAPSULE BY MOUTH TWICE DAILY (Patient taking differently: Take 1 g by mouth 2 (two) times daily.) 60 capsule 2   umeclidinium-vilanterol (ANORO ELLIPTA) 62.5-25 MCG/ACT AEPB Inhale 1 puff into the lungs daily. 60 each 6   vitamin B-12 (CYANOCOBALAMIN) 500 MCG tablet Take 500 mcg by mouth daily.     Current Facility-Administered Medications  Medication Dose Route Frequency Provider Last Rate Last Admin   fexofenadine (ALLEGRA) tablet 180 mg  180 mg Oral Daily Michel Bickers, MD        ROS   PHYSICAL EXAMINATION: There were no vitals taken for this visit. Physical Exam  Diagnostic Studies & Laboratory data:     Recent Radiology Findings:   No results found.     I have independently reviewed the above radiology studies  and reviewed the findings with the patient.   Recent Lab Findings: Lab Results  Component Value Date   WBC 8.6 03/22/2021   HGB 17.3 (H) 03/22/2021   HCT 48.8 03/22/2021   PLT 236 03/22/2021   GLUCOSE 95 03/22/2021   CHOL 122 03/22/2021   TRIG 134 03/22/2021   HDL 48 03/22/2021   LDLDIRECT 126.1 02/19/2013   LDLCALC 52 03/22/2021   ALT 19 03/22/2021   AST 16 03/22/2021   NA 138 03/22/2021   K 4.8 03/22/2021   CL 102 03/22/2021   CREATININE 1.29 (H) 03/22/2021   BUN 17 03/22/2021   CO2 29 03/22/2021     PFTs: Pulmonary Functions Testing Results:  TLC  Date Value Ref Range Status  03/03/2021 8.46 L Final   Full set pending, but DLCO was 48%.  - FVC: ***% - FEV1: ***% -DLCO: ***%  Problem List: 9 mm left lower lobe pulmonary nodule with minimal avidity COPD with a DLCO 40% History of coronary artery disease status post CABG. HIV  Assessment / Plan:   This is a 70 year old gentleman with a 9 mm left lower lobe pulmonary  nodule.  Review is partial pulmonary  function testing his DLCO was 40%.  He does have a history of COPD.  If he is amenable to a navigational bronchoscopy this likely would be his best option in the beginning based off of his DLCO not sure if he would be a good surgical candidate for a left lower lobectomy this appears to be primary lung cancer.    Patient will require full set of pulmonary function test prior to his clinic appointment.     I  spent {CHL ONC TIME VISIT - ESPQZ:[id number]} with  the patient face to face in counseling and coordination of care.    Lajuana Matte 04/06/2021 3:45 PM

## 2021-04-07 ENCOUNTER — Other Ambulatory Visit: Payer: Self-pay | Admitting: *Deleted

## 2021-04-07 DIAGNOSIS — Z01818 Encounter for other preprocedural examination: Secondary | ICD-10-CM

## 2021-04-07 DIAGNOSIS — R911 Solitary pulmonary nodule: Secondary | ICD-10-CM

## 2021-04-08 ENCOUNTER — Other Ambulatory Visit: Payer: Self-pay | Admitting: Thoracic Surgery (Cardiothoracic Vascular Surgery)

## 2021-04-08 ENCOUNTER — Institutional Professional Consult (permissible substitution): Payer: Medicare HMO | Admitting: Thoracic Surgery (Cardiothoracic Vascular Surgery)

## 2021-04-08 DIAGNOSIS — R911 Solitary pulmonary nodule: Secondary | ICD-10-CM

## 2021-04-08 LAB — SARS CORONAVIRUS 2 (TAT 6-24 HRS): SARS Coronavirus 2: NEGATIVE

## 2021-04-11 ENCOUNTER — Ambulatory Visit (HOSPITAL_COMMUNITY)
Admission: RE | Admit: 2021-04-11 | Discharge: 2021-04-11 | Disposition: A | Discharge status: Home / Self Care | Payer: Medicare HMO | Source: Ambulatory Visit | Attending: Thoracic Surgery (Cardiothoracic Vascular Surgery) | Admitting: Thoracic Surgery (Cardiothoracic Vascular Surgery)

## 2021-04-11 ENCOUNTER — Other Ambulatory Visit: Payer: Self-pay

## 2021-04-11 ENCOUNTER — Institutional Professional Consult (permissible substitution) (INDEPENDENT_AMBULATORY_CARE_PROVIDER_SITE_OTHER): Payer: Medicare HMO | Admitting: Thoracic Surgery (Cardiothoracic Vascular Surgery)

## 2021-04-11 VITALS — BP 172/76 | HR 65 | Resp 20 | Ht 66.0 in | Wt 138.0 lb

## 2021-04-11 DIAGNOSIS — Z01818 Encounter for other preprocedural examination: Secondary | ICD-10-CM | POA: Insufficient documentation

## 2021-04-11 DIAGNOSIS — R911 Solitary pulmonary nodule: Secondary | ICD-10-CM | POA: Diagnosis present

## 2021-04-11 DIAGNOSIS — R0600 Dyspnea, unspecified: Secondary | ICD-10-CM | POA: Insufficient documentation

## 2021-04-11 LAB — PULMONARY FUNCTION TEST
DL/VA % pred: 43 %
DL/VA: 1.8 ml/min/mmHg/L
DLCO unc % pred: 47 %
DLCO unc: 10.69 ml/min/mmHg
FEF 25-75 Post: 0.73 L/sec
FEF 25-75 Pre: 0.36 L/sec
FEF2575-%Change-Post: 100 %
FEF2575-%Pred-Post: 35 %
FEF2575-%Pred-Pre: 17 %
FEV1-%Change-Post: 21 %
FEV1-%Pred-Post: 62 %
FEV1-%Pred-Pre: 50 %
FEV1-Post: 1.69 L
FEV1-Pre: 1.39 L
FEV1FVC-%Change-Post: 6 %
FEV1FVC-%Pred-Pre: 57 %
FEV6-%Change-Post: 21 %
FEV6-%Pred-Post: 92 %
FEV6-%Pred-Pre: 76 %
FEV6-Post: 3.25 L
FEV6-Pre: 2.67 L
FEV6FVC-%Change-Post: 6 %
FEV6FVC-%Pred-Post: 92 %
FEV6FVC-%Pred-Pre: 86 %
FVC-%Change-Post: 14 %
FVC-%Pred-Post: 100 %
FVC-%Pred-Pre: 88 %
FVC-Post: 3.76 L
FVC-Pre: 3.3 L
Post FEV1/FVC ratio: 45 %
Post FEV6/FVC ratio: 86 %
Pre FEV1/FVC ratio: 42 %
Pre FEV6/FVC Ratio: 81 %
RV % pred: 168 %
RV: 3.79 L
TLC % pred: 126 %
TLC: 7.87 L

## 2021-04-11 MED ORDER — ALBUTEROL SULFATE (2.5 MG/3ML) 0.083% IN NEBU
2.5000 mg | INHALATION_SOLUTION | Freq: Once | RESPIRATORY_TRACT | Status: AC
  Administered 2021-04-11: 2.5 mg via RESPIRATORY_TRACT

## 2021-04-11 NOTE — Progress Notes (Signed)
[address]       [name] [address]             [phone]                    [name] Kim  Medical Record #[id number] Date of Birth: 08-Mar-1951  Referring: [name] Gobble, MD Primary Care: [name] Limbo, MD Primary Cardiologist: None  Chief Complaint:    Chief Complaint  Patient presents with   Lung Lesion    Initial surgical consult, PET 10/17, CT 10/7    History of Present Illness:    [name] Kim 70 y.o. male referred by Dr. Lamonte Kim for surgical evaluation of a 9 mm left lower lobe pulmonary nodule.  He does have a history of tobacco abuse, COPD, coronary artery disease and status post CABG, and HIV.  He recently underwent a PET/CT which showed minimal uptake in this pulmonary nodule.  Of note he does have pulmonary function testing which showed DLCO of 48%.  This was found incidentally the pulmonary nodules on incidentally when he presented to the emergency department for shortness of breath.  Prior to this he was very active and able to walk about 3 miles without any difficulty, however he does admit to being short of breath at the end of his walk.      [name] Kim: At the time of surgery this patient's most appropriate activity status/level should be described as: [x]     0    Normal activity, no symptoms []     1    Restricted in physical strenuous activity but ambulatory, able to do out light work []     2    Ambulatory and capable of self care, unable to do work activities, up and about               >50 % of waking hours                              []     3    Only limited self care, in bed greater than 50% of waking hours []     4    Completely disabled, no self care, confined to bed or chair []     5    Moribund   Past Medical History:  Diagnosis Date   AAA (abdominal aortic aneurysm)    CAD (coronary artery disease)    Chronic kidney disease    GERD (gastroesophageal reflux disease)    HIV infection ([place])     Hypercholesteremia    Hyperlipidemia    Seasonal allergies    Seizures ([place])    last one Oct 2003 petit mal   Sleep behavior disorder, 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    Past Surgical History:  Procedure Laterality Date   APPENDECTOMY  1970   CATARACT EXTRACTION Bilateral    CATARACT EXTRACTION W/PHACO Right 02/03/2020   Procedure: CATARACT EXTRACTION PHACO AND INTRAOCULAR LENS PLACEMENT ([place]) RIGHT 4.30 00:28.6;  Surgeon: [name] Robson, MD;  Location: [facility];  Service: Ophthalmology;  Laterality: Right;   CATARACT EXTRACTION W/PHACO Left 02/24/2020   Procedure: CATARACT EXTRACTION PHACO AND INTRAOCULAR LENS PLACEMENT ([facility]) LEFT 5.58 00:32.2;  Surgeon: [name] Robson, MD;  Location: [facility];  Service: Ophthalmology;  Laterality: Left;   CHOLECYSTECTOMY     CORONARY ARTERY BYPASS GRAFT  08-09-2005   LIMA-LAD, SVG-diagonal, SVG-OM   TONSILECTOMY, ADENOIDECTOMY, BILATERAL MYRINGOTOMY  AND TUBES  [phone]   TONSILLECTOMY      Family History  Problem Relation Age of Onset   Cancer Mother    Cancer Father        leukemia     Social History   Tobacco Use  Smoking Status Former   Packs/day: 0.50   Types: Cigarettes   Quit date: 08/09/2005   Years since quitting: 15.6  Smokeless Tobacco Never    Social History   Substance and Sexual Activity  Alcohol Use Yes   Alcohol/week: 0.0 standard drinks   Comment: about 2 per week     Allergies  Allergen Reactions   Azt [Zidovudine] Other (See Comments)    Caused bone marrow problems  2000    Current Outpatient Medications  Medication Sig Dispense Refill   albuterol (VENTOLIN HFA) 108 (90 Base) MCG/ACT inhaler Inhale 1 puff into the lungs every 6 (six) hours as needed for wheezing or shortness of breath. 1 each 8   ANORO ELLIPTA 62.5-25 MCG/INH AEPB Inhale 1 puff into the lungs daily. 1 each 2   aspirin 81 MG tablet Take 81 mg by mouth daily.     atorvastatin (LIPITOR) 20 MG tablet TAKE 1 TABLET(20 MG) BY MOUTH DAILY  AT 6 PM (Patient taking differently: Take 20 mg by mouth every evening.) 90 tablet 3   bictegravir-emtricitabine-tenofovir AF (BIKTARVY) 50-200-25 MG TABS tablet Take 1 tablet by mouth daily. 30 tablet 11   cholecalciferol (VITAMIN D) 1000 units tablet Take 1,000 Units by mouth daily.     ezetimibe (ZETIA) 10 MG tablet Take 1 tablet (10 mg total) by mouth daily. 90 tablet 3   folic acid (FOLVITE) 295 MCG tablet Take 400 mcg by mouth daily.     lamoTRIgine (LAMICTAL) 100 MG tablet Take 100 mg by mouth 2 (two) times daily.     omega-3 acid ethyl esters (LOVAZA) 1 g capsule TAKE 1 CAPSULE BY MOUTH TWICE DAILY (Patient taking differently: Take 1 g by mouth 2 (two) times daily.) 60 capsule 2   umeclidinium-vilanterol (ANORO ELLIPTA) 62.5-25 MCG/ACT AEPB Inhale 1 puff into the lungs daily. 60 each 6   vitamin B-12 (CYANOCOBALAMIN) 500 MCG tablet Take 500 mcg by mouth daily.     Current Facility-Administered Medications  Medication Dose Route Frequency Provider Last Rate Last Admin   fexofenadine (ALLEGRA) tablet 180 mg  180 mg Oral Daily [name] Bickers, MD        Review of Systems  Constitutional: Negative.   Respiratory:  Positive for cough and sputum production.   Gastrointestinal: Negative.     PHYSICAL EXAMINATION: BP (!) 172/76 (BP Location: Right Arm, Patient Position: Sitting)   Pulse 65   Resp 20   Ht 5\' 6"  (1.676 m)   Wt 138 lb (62.6 kg)   SpO2 94% Comment: RA  BMI 22.27 kg/m  Physical Exam Constitutional:      General: He is not in acute distress.    Appearance: He is not ill-appearing.  HENT:     Head: Normocephalic and atraumatic.  Cardiovascular:     Rate and Rhythm: Normal rate.  Pulmonary:     Effort: Pulmonary effort is normal. No respiratory distress.     Comments: Wet cough Abdominal:     General: Abdomen is flat. There is no distension.  Musculoskeletal:        General: Normal range of motion.     Cervical back: Normal range of motion.  Neurological:      General: No  focal deficit present.     Mental Status: He is alert and oriented to person, place, and time.    Diagnostic Studies & Laboratory data:     Recent Radiology Findings:   No results found.     I have independently reviewed the above radiology studies  and reviewed the findings with the patient.   Recent Lab Findings: Lab Results  Component Value Date   WBC 8.6 03/22/2021   HGB 17.3 (H) 03/22/2021   HCT 48.8 03/22/2021   PLT 236 03/22/2021   GLUCOSE 95 03/22/2021   CHOL 122 03/22/2021   TRIG 134 03/22/2021   HDL 48 03/22/2021   LDLDIRECT 126.1 02/19/2013   LDLCALC 52 03/22/2021   ALT 19 03/22/2021   AST 16 03/22/2021   NA 138 03/22/2021   K 4.8 03/22/2021   CL 102 03/22/2021   CREATININE 1.29 (H) 03/22/2021   BUN 17 03/22/2021   CO2 29 03/22/2021     PFTs:  - FVC: 88% - FEV1: 50% -DLCO: 47%  Problem List: 9 mm left lower lobe pulmonary nodule with minimal avidity COPD with a DLCO 47% History of coronary artery disease status post CABG. HIV  Assessment / Plan:   This is a 70 year old gentleman with a 9 mm left lower lobe pulmonary nodule.  He does have a history of COPD and on most recent pulmonary function testing his DLCO was 47%.  We discussed several options for management of this pulmonary nodule which included SBRT and surgical resection.  I explained to him that given his current lung function he is at very high risk of requiring lifelong oxygen.  I think the priority is to obtain a tissue sample, and will discuss this with Dr. Lamonte Kim.  If this proves to be a primary lung cancer then we could potentially revisit surgical resection with him understanding the need of oxygen following this.  I have recommended that he strongly consider radiation since the nodule is very small and peripheral.  I  spent 40 minutes with  the patient face to face in counseling and coordination of care.    Lajuana Matte 04/11/2021 5:10 PM

## 2021-04-21 ENCOUNTER — Other Ambulatory Visit: Payer: Self-pay | Admitting: Cardiovascular Disease

## 2021-05-05 ENCOUNTER — Encounter: Payer: Self-pay | Admitting: Emergency Medicine

## 2021-05-05 ENCOUNTER — Other Ambulatory Visit: Payer: Self-pay

## 2021-05-05 ENCOUNTER — Ambulatory Visit (INDEPENDENT_AMBULATORY_CARE_PROVIDER_SITE_OTHER): Payer: Medicare HMO | Admitting: Emergency Medicine

## 2021-05-05 VITALS — BP 144/80 | HR 62 | Temp 97.5°F | Ht 66.5 in | Wt 141.8 lb

## 2021-05-05 DIAGNOSIS — R911 Solitary pulmonary nodule: Secondary | ICD-10-CM

## 2021-05-05 DIAGNOSIS — J449 Chronic obstructive pulmonary disease, unspecified: Secondary | ICD-10-CM | POA: Diagnosis not present

## 2021-05-05 NOTE — Assessment & Plan Note (Signed)
Left lower lobe pulmonary nodule, no significant hypermetabolism.  Discussed the options, watchful waiting versus proceeding to get a tissue diagnosis.  We will go ahead and try to set up navigational bronchoscopy to facilitate SBRT if tissue diagnosis established.  We will work on setting up navigational bronchoscopy to evaluate your small left lower lobe pulmonary nodule.  This will be done under general anesthesia as an outpatient at [facility] endoscopy.  You will need a designated driver.  We will try to get this set up for the second week of January.  You will need to stop your aspirin 2 days prior to the scheduled procedure.  You will be contacted to discuss how to take your other medications. Follow with Dr Lamonte Sakai in 1 month

## 2021-05-05 NOTE — Patient Instructions (Addendum)
Please continue Anoro once daily. Keep your albuterol available to use 2 puffs when needed for shortness of breath, chest tightness, wheezing. We reviewed your pulmonary function testing and your scans today. We will work on setting up navigational bronchoscopy to evaluate your small left lower lobe pulmonary nodule.  This will be done under general anesthesia as an outpatient at [facility] endoscopy.  You will need a designated driver.  We will try to get this set up for the second week of January.  You will need to stop your aspirin 2 days prior to the scheduled procedure.  You will be contacted to discuss how to take your other medications. Follow with Dr Lamonte Sakai in 1 month

## 2021-05-05 NOTE — H&P (View-Only) (Signed)
° °  Subjective:    Patient ID: [name] Kim, male    DOB: Feb 01, 1951, 70 y.o.   MRN: [id number]   HPI  ROV 03/31/21 --70 year old man with a history of tobacco use, HIV, associated COPD, CAD/CABG, hyperlipidemia, partial seizures, rhinitis and GERD.  Also following a newly identified 8 mm spiculated left lower lobe pulmonary nodule, stable on short-term follow-up between July and October.  PET scan as below.  PET scan performed 03/07/2021 reviewed by me, shows small right neck lymph node just inferior to the parotid.  Left lower lobe pulmonary nodule 9 x 6 mm with a maximum SUV of 2.06 which is below mediastinal blood pool activity.  No evidence of hypermetabolism in the lymph nodes or any evidence of distant disease.  ROV 05/05/21 --[name] Kim is 70 and has a history of tobacco use with associated COPD, HIV, CAD/CABG, hyperlipidemia, partial seizures.  Also rhinitis and GERD.  We are following a 9 x 6 mm left lower lobe pulmonary nodule first identified 11/29/2020.  He saw Dr. Kipp Brood 04/11/2021 and it was recommended that he would likely benefit most from SBRT if we believe the lesion is a malignancy. He is on Anoro, using albuterol about once a day, with exertion and yard work.   PFT done 04/11/21 reviewed by me show severe obstruction with FEV1 50% predicted.    Review of Systems As per HPI     Objective:   Physical Exam Vitals:   05/05/21 0929  BP: (!) 144/80  Pulse: 62  Temp: (!) 97.5 F (36.4 C)  TempSrc: Oral  SpO2: 100%  Weight: 141 lb 12.8 oz (64.3 kg)  Height: 5' 6.5" (1.689 m)    Gen: Pleasant, well-nourished, in no distress,  normal affect  ENT: No lesions,  mouth clear,  oropharynx clear, no postnasal drip  Neck: No JVD, no stridor  Lungs: No use of accessory muscles, no crackles or wheezing on normal respiration, no wheeze on forced expiration  Cardiovascular: RRR, heart sounds normal, no murmur or gallops, no peripheral edema  Musculoskeletal: No deformities, no  cyanosis or clubbing  Neuro: alert, awake, non focal  Skin: Warm, no lesions or rash      Assessment & Plan:   COPD (chronic obstructive pulmonary disease) (HCC) Please continue Anoro once daily. Keep your albuterol available to use 2 puffs when needed for shortness of breath, chest tightness, wheezing. We reviewed your pulmonary function testing and your scans today.   Pulmonary nodule Left lower lobe pulmonary nodule, no significant hypermetabolism.  Discussed the options, watchful waiting versus proceeding to get a tissue diagnosis.  We will go ahead and try to set up navigational bronchoscopy to facilitate SBRT if tissue diagnosis established.  We will work on setting up navigational bronchoscopy to evaluate your small left lower lobe pulmonary nodule.  This will be done under general anesthesia as an outpatient at [facility] endoscopy.  You will need a designated driver.  We will try to get this set up for the second week of January.  You will need to stop your aspirin 2 days prior to the scheduled procedure.  You will be contacted to discuss how to take your other medications. Follow with Dr Lamonte Sakai in 1 month  Time spent 41 minutes  Baltazar Apo, MD, PhD 05/05/2021, 10:05 AM  Pulmonary and Critical Care [phone] or if no answer before 7:00PM call [phone] For any issues after 7:00PM please call eLink [phone]

## 2021-05-05 NOTE — Progress Notes (Signed)
° °  Subjective:    Patient ID: [name] Kim, male    DOB: [birth year], 70 y.o.   MRN: [id number]   HPI  ROV 03/31/21 --70 year old man with a history of tobacco use, HIV, associated COPD, CAD/CABG, hyperlipidemia, partial seizures, rhinitis and GERD.  Also following a newly identified 8 mm spiculated left lower lobe pulmonary nodule, stable on short-term follow-up between July and October.  PET scan as below.  PET scan performed 03/07/2021 reviewed by me, shows small right neck lymph node just inferior to the parotid.  Left lower lobe pulmonary nodule 9 x 6 mm with a maximum SUV of 2.06 which is below mediastinal blood pool activity.  No evidence of hypermetabolism in the lymph nodes or any evidence of distant disease.  ROV 05/05/21 --[name] Kim is 74 and has a history of tobacco use with associated COPD, HIV, CAD/CABG, hyperlipidemia, partial seizures.  Also rhinitis and GERD.  We are following a 9 x 6 mm left lower lobe pulmonary nodule first identified 11/29/2020.  He saw Dr. Kipp Brood 04/11/2021 and it was recommended that he would likely benefit most from SBRT if we believe the lesion is a malignancy. He is on Anoro, using albuterol about once a day, with exertion and yard work.   PFT done 04/11/21 reviewed by me show severe obstruction with FEV1 50% predicted.    Review of Systems As per HPI     Objective:   Physical Exam Vitals:   05/05/21 0929  BP: (!) 144/80  Pulse: 62  Temp: (!) 97.5 F (36.4 C)  TempSrc: Oral  SpO2: 100%  Weight: 141 lb 12.8 oz (64.3 kg)  Height: 5' 6.5" (1.689 m)    Gen: Pleasant, well-nourished, in no distress,  normal affect  ENT: No lesions,  mouth clear,  oropharynx clear, no postnasal drip  Neck: No JVD, no stridor  Lungs: No use of accessory muscles, no crackles or wheezing on normal respiration, no wheeze on forced expiration  Cardiovascular: RRR, heart sounds normal, no murmur or gallops, no peripheral edema  Musculoskeletal: No deformities, no  cyanosis or clubbing  Neuro: alert, awake, non focal  Skin: Warm, no lesions or rash      Assessment & Plan:   COPD (chronic obstructive pulmonary disease) (HCC) Please continue Anoro once daily. Keep your albuterol available to use 2 puffs when needed for shortness of breath, chest tightness, wheezing. We reviewed your pulmonary function testing and your scans today.   Pulmonary nodule Left lower lobe pulmonary nodule, no significant hypermetabolism.  Discussed the options, watchful waiting versus proceeding to get a tissue diagnosis.  We will go ahead and try to set up navigational bronchoscopy to facilitate SBRT if tissue diagnosis established.  We will work on setting up navigational bronchoscopy to evaluate your small left lower lobe pulmonary nodule.  This will be done under general anesthesia as an outpatient at [facility] endoscopy.  You will need a designated driver.  We will try to get this set up for the second week of January.  You will need to stop your aspirin 2 days prior to the scheduled procedure.  You will be contacted to discuss how to take your other medications. Follow with Dr Lamonte Sakai in 1 month  Time spent 41 minutes  Baltazar Apo, MD, PhD 05/05/2021, 10:05 AM [facility] [phone] or if no answer before 7:00PM call [phone] For any issues after 7:00PM please call eLink [phone]

## 2021-05-05 NOTE — Assessment & Plan Note (Signed)
Please continue Anoro once daily. Keep your albuterol available to use 2 puffs when needed for shortness of breath, chest tightness, wheezing. We reviewed your pulmonary function testing and your scans today.

## 2021-05-27 ENCOUNTER — Other Ambulatory Visit
Admission: RE | Admit: 2021-05-27 | Discharge: 2021-05-27 | Disposition: A | Discharge status: Home / Self Care | Payer: Medicare HMO | Source: Ambulatory Visit | Attending: Emergency Medicine | Admitting: Emergency Medicine

## 2021-05-27 ENCOUNTER — Encounter (HOSPITAL_COMMUNITY): Payer: Self-pay | Admitting: Emergency Medicine

## 2021-05-27 ENCOUNTER — Other Ambulatory Visit: Payer: Self-pay

## 2021-05-27 DIAGNOSIS — Z20822 Contact with and (suspected) exposure to covid-19: Secondary | ICD-10-CM | POA: Insufficient documentation

## 2021-05-27 DIAGNOSIS — Z01812 Encounter for preprocedural laboratory examination: Secondary | ICD-10-CM | POA: Diagnosis present

## 2021-05-27 NOTE — Anesthesia Preprocedure Evaluation (Addendum)
Anesthesia Evaluation  Patient identified by MRN, date of birth, ID band Patient awake    Reviewed: Allergy & Precautions, H&P , NPO status , Patient's Chart, lab work & pertinent test results  Airway Mallampati: II   Neck ROM: full    Dental   Pulmonary COPD, former smoker,  Left lower lobe nodule   breath sounds clear to auscultation       Cardiovascular + CAD and + CABG   Rhythm:regular Rate:Normal     Neuro/Psych Seizures -,     GI/Hepatic GERD  ,  Endo/Other    Renal/GU Renal InsufficiencyRenal disease     Musculoskeletal   Abdominal   Peds  Hematology  (+) HIV,   Anesthesia Other Findings   Reproductive/Obstetrics                            Anesthesia Physical Anesthesia Plan  ASA: 3  Anesthesia Plan: General   Post-op Pain Management:    Induction: Intravenous  PONV Risk Score and Plan: 2 and Ondansetron, Dexamethasone and Treatment may vary due to age or medical condition  Airway Management Planned: Oral ETT  Additional Equipment:   Intra-op Plan:   Post-operative Plan: Extubation in OR  Informed Consent: I have reviewed the patients History and Physical, chart, labs and discussed the procedure including the risks, benefits and alternatives for the proposed anesthesia with the patient or authorized representative who has indicated his/her understanding and acceptance.     Dental advisory given  Plan Discussed with: CRNA, Anesthesiologist and Surgeon  Anesthesia Plan Comments: (PAT note written 05/27/2021 by Myra Gianotti, PA-C. )       Anesthesia Quick Evaluation

## 2021-05-27 NOTE — Progress Notes (Signed)
DUE TO COVID-19 ONLY ONE VISITOR IS ALLOWED TO COME WITH YOU AND STAY IN THE WAITING ROOM ONLY DURING PRE OP AND PROCEDURE DAY OF SURGERY.   PCP - Dr Bernerd Limbo Cardiologist - Dr Ida Rogue Gastrology - Dr Carol Ada Infectious Diseases - Dr Michel Bickers  CT Chest x-ray - 02/25/21 EKG - 11/29/20 Stress Test - 06/08/10 ECHO - 07/24/13 Cardiac Cath - n/a  ICD Pacemaker/Loop - n/a  Sleep Study -  n/a CPAP - none  Aspirin Instructions: Follow your surgeon's instructions on when to stop aspirin prior to surgery,  If no instructions were given by your surgeon then you will need to call the office for those instructions.  Anesthesia review: Yes  STOP now taking any Aspirin (unless otherwise instructed by your surgeon), Aleve, Naproxen, Ibuprofen, Motrin, Advil, Goody's, BC's, all herbal medications, fish oil, and all vitamins.   Coronavirus Screening Covid test is scheduled on 05/27/21 Do you have any of the following symptoms:  Cough yes/no: No Fever (>100.79F)  yes/no: No Runny nose yes/no: No Sore throat yes/no: No Difficulty breathing/shortness of breath  yes/no: No  Have you traveled in the last 14 days and where? yes/no: No  Patient verbalized understanding of instructions that were given via phone.

## 2021-05-27 NOTE — Progress Notes (Signed)
Anesthesia Chart Review: [name] Kim   Case: [id number] Date/Time: 05/31/21 0730   Procedure: ROBOTIC ASSISTED NAVIGATIONAL BRONCHOSCOPY   Anesthesia type: General   Pre-op diagnosis: LEFT LOWER LOBE PULMONARY NODULE   Location: [facility] END[facility]ARDIOLOG[facility] ROOM 3 / [facility]   Surgeons: Collene Gobble, MD       DISCUSSION: Patient is a 71 year old male scheduled for the above procedure.  History includes former smoker (quit 08/09/05), COPD, CAD (s/p off pump CABG x3 08/09/05: LIMA-LAD, SVG-D2, SVG-OM1), HLD, HIV, CKD, seizures (brain biopsy negative for PML infection, had residual encephalomalacia right occipital area, developed seizures 2001-2003), REM sleep behavior disorder, GERD, anemia, AAA (3.2 cm 03/07/21 PET scan).   No cardiac testing recommended at 07/20/20 visit with Dr. Rockey Situ.  05/27/21 COVID-19 test in process. He is a same day work-up, so labs as indicated and anesthesia team evaluation on the day of surgery.  Dr. Lamonte Sakai has asked him to hold aspirin for 2 days prior to procedure.   VS: Ht 5' 6.5" (1.689 m)    Wt 63.5 kg    BMI 22.26 kg/m  BP Readings from Last 3 Encounters:  05/05/21 (!) 144/80  04/11/21 (!) 172/76  04/06/21 (!) 165/73   Pulse Readings from Last 3 Encounters:  05/05/21 62  04/11/21 65  04/06/21 60     PROVIDERS: Bernerd Limbo, MD is PCP  - Ida Rogue, MD is cardiologist. Last visit 07/20/20.  No anginal symptoms.  No further cardiac work-up recommended at that time.  1 year follow-up this scheduled for 07/20/21. Michel Bickers, MD is ID. Last visit 04/06/21.  HIV has been well controlled on Biktarvy.  1 year follow-up planned. Carol Ada, MD is GI - Baltazar Apo, MD is pulmonologist - Melodie Bouillon, MD is CT surgeon.  If lung biopsy is positive for lung cancer, then he may be considered for radiation therapy versus surgical resection with understanding that he may need postoperative oxygen. Appears SBRT would be favored given his lung  function.   LABS: For day of surgery as indicated. Last results include: Lab Results  Component Value Date   WBC 8.6 03/22/2021   HGB 17.3 (H) 03/22/2021   HCT 48.8 03/22/2021   PLT 236 03/22/2021   GLUCOSE 95 03/22/2021   ALT 19 03/22/2021   AST 16 03/22/2021   NA 138 03/22/2021   K 4.8 03/22/2021   CL 102 03/22/2021   CREATININE 1.29 (H) 03/22/2021   BUN 17 03/22/2021   CO2 29 03/22/2021     PFT 11/212/22: FVC 3.30 (88%), post 3.76 (100%) FEV1 1.39 (50%), post 1.69 (62%). DLCO 10.69 (47%) "severe obstruction with FEV1 50% predicted"   IMAGES: PET Scan 03/07/21: IMPRESSION: - LEFT lower lobe nodule with mild increased FDG uptake, less than cardiac blood pool, this remains suspicious for bronchogenic neoplasm. Low level uptake is noted in this small nodule with lower lobe location. - No signs of metastatic disease to the neck, chest, abdomen or pelvis. - Small lymph node in the RIGHT neck with mildly increased metabolic activity more likely reactive. Consider correlation with physical exam or focused ultrasound and follow-up as warranted. - Signs of infrarenal abdominal aortic aneurysm measuring 3.2 cm greatest dimension. Recommend follow-up every 2 years. This recommendation follows ACR consensus guidelines: White Paper of the ACR Incidental Findings Committee II on Vascular Findings. J Am Coll Radiol 2013; 10:789-794. - Aortic Atherosclerosis (ICD10-I70.0) and Emphysema (ICD10-J43.9).   CT Super D Chest 02/25/21: IMPRESSION: 1. Spiculated left lower  lobe nodule is unchanged and highly worrisome for primary bronchogenic carcinoma. 2.  Aortic atherosclerosis (ICD10-I70.0). 3.  Emphysema (ICD10-J43.9).    EKG: 12/03/20: Sinus rhythm with Premature atrial complexes Artifact Abnormal ECG Confirmed by Carmin Muskrat [phone]) on 11/29/2020 8:17:00 AM   CV: Exercise Stress Echo 07/24/13: Impressions:  - Normal study after maximal exercise. Normal stress  echo.  Last cath noted was from 07/26/05 prior to CABG. LVEF 60%.    Past Medical History:  Diagnosis Date   AAA (abdominal aortic aneurysm)    Anemia    CAD (coronary artery disease)    Chronic kidney disease    COPD (chronic obstructive pulmonary disease) (HCC)    GERD (gastroesophageal reflux disease)    History of blood transfusion    x 3   HIV infection ([place])    Hypercholesteremia    Hyperlipidemia    Seasonal allergies    Seizures ([place])    last one Oct 2003 petit mal   Sleep behavior disorder, 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    Past Surgical History:  Procedure Laterality Date   APPENDECTOMY  1970   CATARACT EXTRACTION Bilateral    CATARACT EXTRACTION W/PHACO Right 02/03/2020   Procedure: CATARACT EXTRACTION PHACO AND INTRAOCULAR LENS PLACEMENT (IOC) RIGHT 4.30 00:28.6;  Surgeon: Birder Robson, MD;  Location: [place];  Service: Ophthalmology;  Laterality: Right;   CATARACT EXTRACTION W/PHACO Left 02/24/2020   Procedure: CATARACT EXTRACTION PHACO AND INTRAOCULAR LENS PLACEMENT (IOC) LEFT 5.58 00:32.2;  Surgeon: Birder Robson, MD;  Location: [place];  Service: Ophthalmology;  Laterality: Left;   CHOLECYSTECTOMY     COLONOSCOPY     CORONARY ARTERY BYPASS GRAFT  08/09/2005   LIMA-LAD, SVG-diagonal, SVG-OM   TONSILECTOMY, ADENOIDECTOMY, BILATERAL MYRINGOTOMY AND TUBES  08/1964    MEDICATIONS:  fexofenadine (ALLEGRA) tablet 180 mg    acetaminophen (TYLENOL) 500 MG tablet   albuterol (VENTOLIN HFA) 108 (90 Base) MCG/ACT inhaler   aspirin 81 MG tablet   atorvastatin (LIPITOR) 20 MG tablet   bictegravir-emtricitabine-tenofovir AF (BIKTARVY) 50-200-25 MG TABS tablet   cholecalciferol (VITAMIN D) 1000 units tablet   Coenzyme Q10 (COQ10 PO)   ezetimibe (ZETIA) 10 MG tablet   folic acid (FOLVITE) 425 MCG tablet   lamoTRIgine (LAMICTAL) 100 MG tablet   omega-3 acid ethyl esters (LOVAZA) 1 g capsule   umeclidinium-vilanterol (ANORO ELLIPTA) 62.5-25 MCG/ACT  AEPB   vitamin B-12 (CYANOCOBALAMIN) 500 MCG tablet    Myra Gianotti, PA-C Surgical Short Stay/Anesthesiology [facility] Phone [phone] [facility] Phone [phone] 05/27/2021 11:52 AM

## 2021-05-28 LAB — SARS CORONAVIRUS 2 (TAT 6-24 HRS): SARS Coronavirus 2: NEGATIVE

## 2021-05-31 ENCOUNTER — Ambulatory Visit (HOSPITAL_COMMUNITY): Payer: Medicare HMO | Admitting: Vascular Surgery

## 2021-05-31 ENCOUNTER — Encounter (HOSPITAL_COMMUNITY)
Admission: RE | Disposition: A | Discharge status: Home / Self Care | Payer: Self-pay | Source: Home / Self Care | Attending: Emergency Medicine

## 2021-05-31 ENCOUNTER — Ambulatory Visit (HOSPITAL_COMMUNITY): Payer: Medicare HMO

## 2021-05-31 ENCOUNTER — Encounter (HOSPITAL_COMMUNITY): Payer: Self-pay | Admitting: Emergency Medicine

## 2021-05-31 ENCOUNTER — Ambulatory Visit (HOSPITAL_COMMUNITY)
Admission: RE | Admit: 2021-05-31 | Discharge: 2021-05-31 | Disposition: A | Discharge status: Home / Self Care | Payer: Medicare HMO | Attending: Emergency Medicine | Admitting: Emergency Medicine

## 2021-05-31 ENCOUNTER — Other Ambulatory Visit: Payer: Self-pay

## 2021-05-31 DIAGNOSIS — C3432 Malignant neoplasm of lower lobe, left bronchus or lung: Secondary | ICD-10-CM | POA: Insufficient documentation

## 2021-05-31 DIAGNOSIS — K219 Gastro-esophageal reflux disease without esophagitis: Secondary | ICD-10-CM | POA: Insufficient documentation

## 2021-05-31 DIAGNOSIS — E785 Hyperlipidemia, unspecified: Secondary | ICD-10-CM | POA: Diagnosis not present

## 2021-05-31 DIAGNOSIS — I2510 Atherosclerotic heart disease of native coronary artery without angina pectoris: Secondary | ICD-10-CM | POA: Insufficient documentation

## 2021-05-31 DIAGNOSIS — B20 Human immunodeficiency virus [HIV] disease: Secondary | ICD-10-CM | POA: Insufficient documentation

## 2021-05-31 DIAGNOSIS — C3492 Malignant neoplasm of unspecified part of left bronchus or lung: Secondary | ICD-10-CM | POA: Diagnosis present

## 2021-05-31 DIAGNOSIS — Z419 Encounter for procedure for purposes other than remedying health state, unspecified: Secondary | ICD-10-CM

## 2021-05-31 DIAGNOSIS — J439 Emphysema, unspecified: Secondary | ICD-10-CM | POA: Diagnosis not present

## 2021-05-31 DIAGNOSIS — R911 Solitary pulmonary nodule: Secondary | ICD-10-CM | POA: Diagnosis present

## 2021-05-31 DIAGNOSIS — I700 Atherosclerosis of aorta: Secondary | ICD-10-CM | POA: Insufficient documentation

## 2021-05-31 DIAGNOSIS — Z951 Presence of aortocoronary bypass graft: Secondary | ICD-10-CM | POA: Insufficient documentation

## 2021-05-31 DIAGNOSIS — Z87891 Personal history of nicotine dependence: Secondary | ICD-10-CM | POA: Insufficient documentation

## 2021-05-31 DIAGNOSIS — Z9889 Other specified postprocedural states: Secondary | ICD-10-CM

## 2021-05-31 HISTORY — PX: VIDEO BRONCHOSCOPY WITH RADIAL ENDOBRONCHIAL ULTRASOUND: SHX6849

## 2021-05-31 HISTORY — PX: BRONCHIAL BIOPSY: SHX5109

## 2021-05-31 HISTORY — PX: BRONCHIAL NEEDLE ASPIRATION BIOPSY: SHX5106

## 2021-05-31 HISTORY — DX: Personal history of other medical treatment: Z92.89

## 2021-05-31 HISTORY — PX: FIDUCIAL MARKER PLACEMENT: SHX6858

## 2021-05-31 HISTORY — DX: Anemia, unspecified: D64.9

## 2021-05-31 HISTORY — PX: BRONCHIAL BRUSHINGS: SHX5108

## 2021-05-31 HISTORY — DX: Chronic obstructive pulmonary disease, unspecified: J44.9

## 2021-05-31 LAB — COMPREHENSIVE METABOLIC PANEL
ALT: 20 U/L (ref 0–44)
AST: 18 U/L (ref 15–41)
Albumin: 4.3 g/dL (ref 3.5–5.0)
Alkaline Phosphatase: 73 U/L (ref 38–126)
Anion gap: 8 (ref 5–15)
BUN: 20 mg/dL (ref 8–23)
CO2: 26 mmol/L (ref 22–32)
Calcium: 9.2 mg/dL (ref 8.9–10.3)
Chloride: 103 mmol/L (ref 98–111)
Creatinine, Ser: 1.51 mg/dL — ABNORMAL HIGH (ref 0.61–1.24)
GFR, Estimated: 49 mL/min — ABNORMAL LOW (ref >60)
Glucose, Bld: 103 mg/dL — ABNORMAL HIGH (ref 70–99)
Potassium: 4 mmol/L (ref 3.5–5.1)
Sodium: 137 mmol/L (ref 135–145)
Total Bilirubin: 1.1 mg/dL (ref 0.3–1.2)
Total Protein: 7.2 g/dL (ref 6.5–8.1)

## 2021-05-31 LAB — CBC
HCT: 49.1 % (ref 39.0–52.0)
Hemoglobin: 16.9 g/dL (ref 13.0–17.0)
MCH: 35.7 pg — ABNORMAL HIGH (ref 26.0–34.0)
MCHC: 34.4 g/dL (ref 30.0–36.0)
MCV: 103.6 fL — ABNORMAL HIGH (ref 80.0–100.0)
Platelets: 219 10*3/uL (ref 150–400)
RBC: 4.74 MIL/uL (ref 4.22–5.81)
RDW: 12.1 % (ref 11.5–15.5)
WBC: 10.7 10*3/uL — ABNORMAL HIGH (ref 4.0–10.5)
nRBC: 0 % (ref 0.0–0.2)

## 2021-05-31 SURGERY — BRONCHOSCOPY, WITH BIOPSY USING ELECTROMAGNETIC NAVIGATION
Anesthesia: General

## 2021-05-31 MED ORDER — ONDANSETRON HCL 4 MG/2ML IJ SOLN: 4.0000 mg | Freq: Four times a day (QID) | INTRAMUSCULAR | Status: DC | PRN

## 2021-05-31 MED ORDER — CHLORHEXIDINE GLUCONATE 0.12 % MT SOLN
OROMUCOSAL | Status: AC
  Administered 2021-05-31: 15 mL via OROMUCOSAL
  Filled 2021-05-31: qty 15

## 2021-05-31 MED ORDER — EPHEDRINE SULFATE-NACL 50-0.9 MG/10ML-% IV SOSY
PREFILLED_SYRINGE | INTRAVENOUS | Status: DC | PRN
  Administered 2021-05-31: 10 mg via INTRAVENOUS

## 2021-05-31 MED ORDER — LACTATED RINGERS IV SOLN: INTRAVENOUS | Status: DC

## 2021-05-31 MED ORDER — ROCURONIUM BROMIDE 10 MG/ML (PF) SYRINGE
PREFILLED_SYRINGE | INTRAVENOUS | Status: DC | PRN
  Administered 2021-05-31: 50 mg via INTRAVENOUS

## 2021-05-31 MED ORDER — PHENYLEPHRINE HCL-NACL 20-0.9 MG/250ML-% IV SOLN
INTRAVENOUS | Status: DC | PRN
  Administered 2021-05-31: 30 ug/min via INTRAVENOUS

## 2021-05-31 MED ORDER — ONDANSETRON HCL 4 MG/2ML IJ SOLN
INTRAMUSCULAR | Status: DC | PRN
  Administered 2021-05-31: 4 mg via INTRAVENOUS

## 2021-05-31 MED ORDER — FENTANYL CITRATE (PF) 100 MCG/2ML IJ SOLN: 25.0000 ug | INTRAMUSCULAR | Status: DC | PRN

## 2021-05-31 MED ORDER — PROPOFOL 10 MG/ML IV BOLUS
INTRAVENOUS | Status: DC | PRN
  Administered 2021-05-31: 160 mg via INTRAVENOUS

## 2021-05-31 MED ORDER — FENTANYL CITRATE (PF) 100 MCG/2ML IJ SOLN
INTRAMUSCULAR | Status: DC | PRN
Start: 2021-05-31 — End: 2021-05-31
  Administered 2021-05-31: 50 ug via INTRAVENOUS

## 2021-05-31 MED ORDER — DEXAMETHASONE SODIUM PHOSPHATE 10 MG/ML IJ SOLN
INTRAMUSCULAR | Status: DC | PRN
  Administered 2021-05-31: 5 mg via INTRAVENOUS

## 2021-05-31 MED ORDER — MIDAZOLAM HCL 2 MG/2ML IJ SOLN
INTRAMUSCULAR | Status: DC | PRN
  Administered 2021-05-31: 2 mg via INTRAVENOUS

## 2021-05-31 MED ORDER — SUGAMMADEX SODIUM 200 MG/2ML IV SOLN
INTRAVENOUS | Status: DC | PRN
  Administered 2021-05-31: 200 mg via INTRAVENOUS

## 2021-05-31 MED ORDER — GLYCOPYRROLATE 0.2 MG/ML IJ SOLN
INTRAMUSCULAR | Status: DC | PRN
  Administered 2021-05-31: .2 mg via INTRAVENOUS

## 2021-05-31 MED ORDER — CHLORHEXIDINE GLUCONATE 0.12 % MT SOLN
15.0000 mL | Freq: Once | OROMUCOSAL | Status: AC
Start: 2021-05-31 — End: 2021-05-31
  Filled 2021-05-31: qty 15

## 2021-05-31 MED ORDER — LIDOCAINE 2% (20 MG/ML) 5 ML SYRINGE
INTRAMUSCULAR | Status: DC | PRN
  Administered 2021-05-31: 60 mg via INTRAVENOUS

## 2021-05-31 MED ORDER — OXYCODONE HCL 5 MG PO TABS: 5.0000 mg | ORAL_TABLET | Freq: Once | ORAL | Status: DC | PRN

## 2021-05-31 MED ORDER — OXYCODONE HCL 5 MG/5ML PO SOLN: 5.0000 mg | Freq: Once | ORAL | Status: DC | PRN

## 2021-05-31 SURGICAL SUPPLY — 1 items: fiducial marker ×1 IMPLANT

## 2021-05-31 NOTE — Discharge Instructions (Signed)
Flexible Bronchoscopy, Care After This sheet gives you information about how to care for yourself after your test. Your doctor may also give you more specific instructions. If you have problems or questions, contact your doctor. Follow these instructions at home: Eating and drinking Do not eat or drink anything (not even water) for 2 hours after your test, or until your numbing medicine (local anesthetic) wears off. When your numbness is gone and your cough and gag reflexes have come back, you may: Eat only soft foods. Slowly drink liquids. The day after the test, go back to your normal diet. Driving Do not drive for 24 hours if you were given a medicine to help you relax (sedative). Do not drive or use heavy machinery while taking prescription pain medicine. General instructions  Take over-the-counter and prescription medicines only as told by your doctor. Return to your normal activities as told. Ask what activities are safe for you. Do not use any products that have nicotine or tobacco in them. This includes cigarettes and e-cigarettes. If you need help quitting, ask your doctor. Keep all follow-up visits as told by your doctor. This is important. It is very important if you had a tissue sample (biopsy) taken. Get help right away if: You have shortness of breath that gets worse. You get light-headed. You feel like you are going to pass out (faint). You have chest pain. You cough up: More than a little blood. More blood than before. Summary Do not eat or drink anything (not even water) for 2 hours after your test, or until your numbing medicine wears off. Do not use cigarettes. Do not use e-cigarettes. Get help right away if you have chest pain.  Please call our office for any questions or concerns.  [phone].  This information is not intended to replace advice given to you by your health care provider. Make sure you discuss any questions you have with your health care  provider. Document Released: 03/05/2009 Document Revised: 04/20/2017 Document Reviewed: 05/26/2016 Elsevier Patient Education  2020 [employer].

## 2021-05-31 NOTE — Interval H&P Note (Signed)
History and Physical Interval Note:  05/31/2021 7:26 AM  [name] Kim  has presented today for surgery, with the diagnosis of LEFT LOWER LOBE PULMONARY NODULE.  The various methods of treatment have been discussed with the patient and family. After consideration of risks, benefits and other options for treatment, the patient has consented to  Procedure(s): ROBOTIC ASSISTED NAVIGATIONAL BRONCHOSCOPY (N/A) as a surgical intervention.  The patient's history has been reviewed, patient examined, no change in status, stable for surgery.  I have reviewed the patient's chart and labs.  Questions were answered to the patient's satisfaction.     Collene Gobble

## 2021-05-31 NOTE — Transfer of Care (Signed)
Immediate Anesthesia Transfer of Care Note  Patient: [name] Kim  Procedure(s) Performed: ROBOTIC ASSISTED NAVIGATIONAL BRONCHOSCOPY VIDEO BRONCHOSCOPY WITH RADIAL ENDOBRONCHIAL ULTRASOUND BRONCHIAL BRUSHINGS BRONCHIAL NEEDLE ASPIRATION BIOPSIES BRONCHIAL BIOPSIES FIDUCIAL MARKER PLACEMENT  Patient Location: PACU  Anesthesia Type:General  Level of Consciousness: drowsy  Airway & Oxygen Therapy: Patient Spontanous Breathing and Patient connected to face mask oxygen  Post-op Assessment: Report given to RN and Post -op Vital signs reviewed and stable  Post vital signs: Reviewed and stable  Last Vitals:  Vitals Value Taken Time  BP 134/62 05/31/21 0845  Temp    Pulse 63 05/31/21 0846  Resp 14 05/31/21 0846  SpO2 97 % 05/31/21 0846  Vitals shown include unvalidated device data.  Last Pain:  Vitals:   05/31/21 0635  TempSrc:   PainSc: 0-No pain         Complications: No notable events documented.

## 2021-05-31 NOTE — Op Note (Signed)
Video Bronchoscopy with Robotic Assisted Bronchoscopic Navigation   Date of Operation: 05/31/2021   Pre-op Diagnosis: Left lower lobe pulmonary nodule  Post-op Diagnosis: Same  Surgeon: Baltazar Apo  Assistants: None  Anesthesia: General endotracheal anesthesia  Operation: Flexible video fiberoptic bronchoscopy with robotic assistance and biopsies.  Estimated Blood Loss: Minimal  Complications: None  Indications and History: [name] Kim is a 71 y.o. male with history of History of tobacco use, COPD, CAD.  He was found to have an 8 mm left lower lobe spiculated pulmonary nodule, hypermetabolic on PET scan.  Recommendation was made to achieve a tissue diagnosis via robotic assisted navigational bronchoscopy with biopsies. The risks, benefits, complications, treatment options and expected outcomes were discussed with the patient.  The possibilities of pneumothorax, pneumonia, reaction to medication, pulmonary aspiration, perforation of a viscus, bleeding, failure to diagnose a condition and creating a complication requiring transfusion or operation were discussed with the patient who freely signed the consent.    Description of Procedure: The patient was seen in the Preoperative Area, was examined and was deemed appropriate to proceed.  The patient was taken to [facility] endoscopy room [place] endoscopy room 3, identified as [name] Kim and the procedure verified as Flexible Video Fiberoptic Bronchoscopy.  A Time Out was held and the above information confirmed.   Prior to the date of the procedure a high-resolution CT scan of the chest was performed. Utilizing ION software program a virtual tracheobronchial tree was generated to allow the creation of distinct navigation pathways to the patient's parenchymal abnormalities. After being taken to the operating room general anesthesia was initiated and the patient  was orally intubated. The video fiberoptic bronchoscope was introduced via the  endotracheal tube and a general inspection was performed which showed normal right and left lung anatomy, aspiration of the bilateral mainstems was completed to remove any remaining secretions. Robotic catheter inserted into patient's endotracheal tube.   Target #1 left lower lobe pulmonary nodule: The distinct navigation pathways prepared prior to this procedure were then utilized to navigate to patient's lesion identified on CT scan. The robotic catheter was secured into place and the vision probe was withdrawn.  Lesion location was approximated using fluoroscopy and radial endobronchial ultrasound for peripheral targeting.  Local registration and targeting was performed using Cios three-dimensional imaging.  Under fluoroscopic guidance transbronchial needle brushings, transbronchial needle biopsies, and transbronchial forceps biopsies were performed to be sent for cytology and pathology.  Under fluoroscopic guidance a single fiducial marker was placed adjacent to the pulmonary nodule.  At the end of the procedure a general airway inspection was performed and there was no evidence of active bleeding. The bronchoscope was removed.  The patient tolerated the procedure well. There was no significant blood loss and there were no obvious complications. A post-procedural chest x-ray is pending.  Samples Target #1: 1. Transbronchial needle brushings from left lower lobe pulmonary nodule 2. Transbronchial Wang needle biopsies from left lower lobe pulmonary nodule 3. Transbronchial forceps biopsies from left lower lobe pulmonary nodule.  Plans:  The patient will be discharged from the PACU to home when recovered from anesthesia and after chest x-ray is reviewed. We will review the cytology, pathology and microbiology results with the patient when they become available. Outpatient followup will be with Dr Lamonte Sakai.   Baltazar Apo, MD, PhD 05/31/2021, 8:40 AM [facility] [phone] or  if no answer before 7:00PM call [phone] For any issues after 7:00PM please call eLink [phone]

## 2021-05-31 NOTE — Anesthesia Procedure Notes (Cosign Needed)
Procedure Name: Intubation Date/Time: 05/31/2021 7:42 AM Performed by: Daryel Gerald, RN Pre-anesthesia Checklist: Patient identified, Emergency Drugs available, Suction available and Patient being monitored Patient Re-evaluated:Patient Re-evaluated prior to induction Oxygen Delivery Method: Circle system utilized Preoxygenation: Pre-oxygenation with 100% oxygen Induction Type: IV induction Ventilation: Mask ventilation without difficulty Laryngoscope Size: Mac and 4 Grade View: Grade I Tube size: 8.5 mm Number of attempts: 1 Airway Equipment and Method: Stylet Placement Confirmation: ETT inserted through vocal cords under direct vision, positive ETCO2 and breath sounds checked- equal and bilateral Secured at: 22 cm Tube secured with: Tape Dental Injury: Teeth and Oropharynx as per pre-operative assessment

## 2021-06-01 LAB — CYTOLOGY - NON PAP

## 2021-06-01 NOTE — Anesthesia Postprocedure Evaluation (Signed)
Anesthesia Post Note  Patient: [name] Kim  Procedure(s) Performed: ROBOTIC ASSISTED NAVIGATIONAL BRONCHOSCOPY VIDEO BRONCHOSCOPY WITH RADIAL ENDOBRONCHIAL ULTRASOUND BRONCHIAL BRUSHINGS BRONCHIAL NEEDLE ASPIRATION BIOPSIES BRONCHIAL BIOPSIES FIDUCIAL MARKER PLACEMENT     Patient location during evaluation: PACU Anesthesia Type: General Level of consciousness: awake and alert Pain management: pain level controlled Vital Signs Assessment: post-procedure vital signs reviewed and stable Respiratory status: spontaneous breathing, nonlabored ventilation, respiratory function stable and patient connected to nasal cannula oxygen Cardiovascular status: blood pressure returned to baseline and stable Postop Assessment: no apparent nausea or vomiting Anesthetic complications: no   No notable events documented.  Last Vitals:  Vitals:   05/31/21 0900 05/31/21 0915  BP: (!) 130/53 123/61  Pulse: 61 (!) 59  Resp: 13 14  Temp:  36.7 C  SpO2: 94% 95%    Last Pain:  Vitals:   05/31/21 0845  TempSrc:   PainSc: 0-No pain                 Barnie Sopko S

## 2021-06-02 ENCOUNTER — Encounter (HOSPITAL_COMMUNITY): Payer: Self-pay | Admitting: Emergency Medicine

## 2021-06-02 ENCOUNTER — Telehealth: Payer: Self-pay | Admitting: Emergency Medicine

## 2021-06-02 DIAGNOSIS — C3492 Malignant neoplasm of unspecified part of left bronchus or lung: Secondary | ICD-10-CM

## 2021-06-02 NOTE — Telephone Encounter (Signed)
PCCs are unable to place orders.Marland KitchenMarland KitchenMarland Kitchen

## 2021-06-02 NOTE — Telephone Encounter (Signed)
Discussed results with the patient > squamous cell lung CA, stage I  His sister works at [employer] and he would like to be seen by Thoracic Oncology at [facility]. I explained that his options are SBRT vs surgery. He is already leaning towards SBRT based on his consultation with Dr Kipp Brood here in [place].    Please refer him as above - I was unable to find an order that made a referral to [facility]

## 2021-06-02 NOTE — Telephone Encounter (Signed)
Order for thoracic oncology clinic has been placed.

## 2021-06-15 ENCOUNTER — Other Ambulatory Visit: Payer: Self-pay | Admitting: Internal Medicine

## 2021-06-15 DIAGNOSIS — C3402 Malignant neoplasm of left main bronchus: Secondary | ICD-10-CM

## 2021-06-15 DIAGNOSIS — C3490 Malignant neoplasm of unspecified part of unspecified bronchus or lung: Secondary | ICD-10-CM

## 2021-06-28 ENCOUNTER — Ambulatory Visit: Payer: Medicare HMO

## 2021-07-19 ENCOUNTER — Other Ambulatory Visit: Payer: Self-pay | Admitting: Cardiovascular Disease

## 2021-07-20 ENCOUNTER — Ambulatory Visit: Payer: Medicare HMO | Admitting: Cardiovascular Disease

## 2021-07-26 ENCOUNTER — Other Ambulatory Visit: Payer: Self-pay | Admitting: Cardiovascular Disease

## 2021-08-01 ENCOUNTER — Ambulatory Visit: Payer: Medicare HMO | Admitting: Cardiovascular Disease

## 2021-08-08 NOTE — Progress Notes (Signed)
Cardiology Office Note ? ?Date:  08/09/2021  ? ?ID:  [name] Kim, DOB 1951/05/20, MRN [id number] ? ?PCP:  Bernerd Limbo, MD  ? ?Chief Complaint  ?Patient presents with  ? 12 month follow up   ?  "Doing well." Patient recently had a malignant neoplasm of lower lobe of left lung removed. Medications reviewed by the patient verbally.     ? ? ?HPI:  ?[name] Kim is a pleasant 71 year-old gentleman with a history of  ?coronary artery disease,  ?bypass surgery in 2007,  ?chronic HIV infection on antibiotic therapy, ?Long history of smoking, cement floors corticosteroid pulse ?mild COPD ?Prior stress test in 2012 showing no ischemia  ?Small AAA ?who presents for routine follow-up of his coronary artery disease, CABG ? ?Recent surgery 07/20/21  for 8 mm nodule ?Left - THORACOSCOPY, SURGICAL; WITH REMOVAL OF A SINGLE LUNG SEGMENT (SEGMENTECTOMY) ?Date 07/20/2021  ? ?Has recovered, walking ?Denies chest pain concerning for angina ?Had some mild shortness of breath after discharge from the hospital but this has improved ?Was discharged on oxygen, does not think he needs it anymore ? ?Asymptomatic bradycardia, not on beta-blockers or AV nodal blocking agents ? ?LDL in the 50s, on statin and zetia ?CR 1.4 ? ?EKG personally reviewed by myself on todays visit ?Sinus bradycardia rate 49 bpm no ST or T wave changes ? ? ?Other past medical hx reviewed ? abdominal aortic ultrasound showing small AAA dated 09/2017 ?Distal abdominal aorta measuring 3.6 cm in greatest dimension. ? ?[place] 2.5 mile loop ?Previously reported symptoms concerning for Raynaud's, not an issue on today's visit ?  ? 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 ? ?PSH:    ?Past Surgical History:  ?Procedure Laterality Date  ? APPENDECTOMY  1970  ? BRONCHIAL BIOPSY  05/31/2021  ? Procedure: BRONCHIAL BIOPSIES;  Surgeon: Collene Gobble, MD;  Location: [facility] ENDOSCOPY;  Service: Pulmonary;;  ? BRONCHIAL BRUSHINGS  05/31/2021  ? Procedure: BRONCHIAL BRUSHINGS;  Surgeon: Collene Gobble, MD;  Location: [facility] ENDOSCOPY;  Service: Pulmonary;;  ? BRONCHIAL NEEDLE ASPIRATION BIOPSY  05/31/2021  ? Procedure: BRONCHIAL NEEDLE ASPIRATION BIOPSIES;  Surgeon: Collene Gobble, MD;  Location: [facility] ENDOSCOPY;  Service: Pulmonary;;  ? CATARACT EXTRACTION Bilateral   ? CATARACT EXTRACTION W/PHACO Right 02/03/2020  ? Procedure: CATARACT EXTRACTION PHACO AND INTRAOCULAR LENS PLACEMENT (IOC) RIGHT 4.30 00:28.6;  Surgeon: Birder Robson, MD;  Location: [place];  Service: Ophthalmology;  Laterality: Right;  ? CATARACT EXTRACTION W/PHACO Left 02/24/2020  ? Procedure: CATARACT EXTRACTION PHACO AND INTRAOCULAR LENS PLACEMENT (IOC) LEFT 5.58 00:32.2;  Surgeon: Birder Robson, MD;  Location: [place];  Service: Ophthalmology;  Laterality: Left;  ? CHOLECYSTECTOMY    ? COLONOSCOPY    ? CORONARY ARTERY BYPASS GRAFT  08/09/2005  ? LIMA-LAD, SVG-diagonal, SVG-OM  ? FIDUCIAL MARKER PLACEMENT  05/31/2021  ? Procedure: FIDUCIAL MARKER PLACEMENT;  Surgeon: Collene Gobble, MD;  Location: [facility] ENDOSCOPY;  Service: Pulmonary;;  ? TONSILECTOMY, ADENOIDECTOMY, BILATERAL MYRINGOTOMY AND TUBES  08/1964  ? VIDEO BRONCHOSCOPY WITH RADIAL ENDOBRONCHIAL ULTRASOUND  05/31/2021  ? Procedure: VIDEO BRONCHOSCOPY WITH RADIAL ENDOBRONCHIAL ULTRASOUND;  Surgeon: Collene Gobble, MD;  Location: [facility] ENDOSCOPY;  Service: Pulmonary;;  ? ? ?  Current Outpatient Medications  ?Medication Sig Dispense Refill  ? acetaminophen (TYLENOL) 500 MG tablet Take 250 mg by mouth every 6 (six) hours as needed (pain).    ? albuterol (VENTOLIN HFA) 108 (90 Base)  MCG/ACT inhaler Inhale 1 puff into the lungs every 6 (six) hours as needed for wheezing or shortness of breath. 1 each 8  ? aspirin 81 MG tablet Take 81 mg by mouth daily.    ? atorvastatin (LIPITOR) 20 MG tablet TAKE 1 TABLET(20 MG) BY MOUTH DAILY AT 6 PM 90 tablet 0  ? bictegravir-emtricitabine-tenofovir AF (BIKTARVY) 50-200-25 MG TABS tablet Take 1 tablet by mouth daily. 30 tablet 11  ? Coenzyme Q10 (COQ10 PO) Take 1 capsule by mouth in the morning.    ? ezetimibe (ZETIA) 10 MG tablet TAKE 1 TABLET(10 MG) BY MOUTH DAILY 90 tablet 0  ? folic acid (FOLVITE) 921 MCG tablet Take 400 mcg by mouth in the morning.    ? lamoTRIgine (LAMICTAL) 100 MG tablet Take 100 mg by mouth 2 (two) times daily.    ? omega-3 acid ethyl esters (LOVAZA) 1 g capsule TAKE 1 CAPSULE BY MOUTH TWICE DAILY 60 capsule 0  ? umeclidinium-vilanterol (ANORO ELLIPTA) 62.5-25 MCG/ACT AEPB Inhale 1 puff into the lungs daily. 60 each 6  ? vitamin B-12 (CYANOCOBALAMIN) 500 MCG tablet Take 500 mcg by mouth in the morning.    ? ?No current facility-administered medications for this visit.  ? ? ? ?Allergies:   Zidovudine  ? ?Social History:  The patient  reports that he quit smoking about 16 years ago. His smoking use included cigarettes. He smoked an average of .5 packs per day. He has never used smokeless tobacco. He reports current alcohol use. He reports that he does not use drugs.  ? ?Family History:   family history includes Cancer in his father and mother.  ? ? ?Review of Systems: ?Review of Systems  ?Constitutional: Negative.   ?Respiratory: Negative.    ?Cardiovascular: Negative.   ?Gastrointestinal: Negative.   ?Musculoskeletal: Negative.   ?Neurological: Negative.   ?Psychiatric/Behavioral: Negative.    ?All other systems reviewed and are negative. ? ?PHYSICAL EXAM: ?VS:  BP (!) 140/58 (BP Location: Left Arm, Patient Position: Sitting, Cuff Size: Normal)   Pulse (!) 49   Ht 5\' 6"  (1.676 m)   Wt 143 lb 6 oz (65 kg)   SpO2 96%   BMI 23.14  kg/m?  , BMI Body mass index is 23.14 kg/m?[name] ?Constitutional:  oriented to person, place, and time. No distress.  ?HENT:  ?Head: Grossly normal ?Eyes:  no discharge. No scleral icterus.  ?Neck: No JVD, no carotid bruits  ?Cardiovascular: Regular rate and rhythm, no murmurs appreciated ?Pulmonary/Chest: Clear to auscultation bilaterally, no wheezes or rails ?Abdominal: Soft.  no distension.  no tenderness.  ?Musculoskeletal: Normal range of motion ?Neurological:  normal muscle tone. Coordination normal. No atrophy ?Skin: Skin warm and dry ?Psychiatric: normal affect, pleasant ? ? ?Recent Labs: ?05/31/2021: ALT 20; BUN 20; Creatinine, Ser 1.51; Hemoglobin 16.9; Platelets 219; Potassium 4.0; Sodium 137  ? ? ?Lipid Panel ?Lab Results  ?Component Value Date  ? CHOL 122 03/22/2021  ? HDL 48 03/22/2021  ? [place] 52 03/22/2021  ? TRIG 134 03/22/2021  ? ?  ?Filed Weights  ? 08/09/21 0836  ?Weight: 143 lb 6 oz (65 kg)  ? ? ?ASSESSMENT AND PLAN: ? ?Coronary artery disease involving native coronary artery of native heart without angina pectoris - ?Currently with no symptoms of  angina. No further workup at this time. Continue current medication regimen. ?On Zetia 10 mg , Lipitor 20 goal LDL less than 70,  ? ?Human immunodeficiency virus (HIV) disease ([place]) ?Managed by ID ? ?Dyslipidemia ?Cholesterol is at goal on the current lipid regimen. No changes to the medications were made. ? ?S/P CABG (coronary artery bypass graft) ?Currently with no symptoms of angina. No further workup at this time. Continue current medication regimen. ? ?Essential hypertension ?Blood pressure typically always elevated on first arrival  ?No med changes made ? ? Total encounter time more than 30 minutes ? Greater than 50% was spent in counseling and coordination of care with the patient ? ? ? ?No orders of the defined types were placed in this encounter. ? ? ? ?Signed, ?Esmond Plants, M.D., Ph.D. ?08/09/2021  ?[place] ?[phone] ? ?

## 2021-08-09 ENCOUNTER — Ambulatory Visit (INDEPENDENT_AMBULATORY_CARE_PROVIDER_SITE_OTHER): Payer: Medicare HMO | Admitting: Cardiovascular Disease

## 2021-08-09 ENCOUNTER — Other Ambulatory Visit: Payer: Self-pay

## 2021-08-09 ENCOUNTER — Encounter: Payer: Self-pay | Admitting: Cardiovascular Disease

## 2021-08-09 VITALS — BP 140/58 | HR 49 | Ht 66.0 in | Wt 143.4 lb

## 2021-08-09 DIAGNOSIS — Z951 Presence of aortocoronary bypass graft: Secondary | ICD-10-CM | POA: Diagnosis not present

## 2021-08-09 DIAGNOSIS — Z87891 Personal history of nicotine dependence: Secondary | ICD-10-CM

## 2021-08-09 DIAGNOSIS — I25118 Atherosclerotic heart disease of native coronary artery with other forms of angina pectoris: Secondary | ICD-10-CM | POA: Diagnosis not present

## 2021-08-09 DIAGNOSIS — B20 Human immunodeficiency virus [HIV] disease: Secondary | ICD-10-CM

## 2021-08-09 DIAGNOSIS — I10 Essential (primary) hypertension: Secondary | ICD-10-CM | POA: Diagnosis not present

## 2021-08-09 DIAGNOSIS — E785 Hyperlipidemia, unspecified: Secondary | ICD-10-CM

## 2021-08-09 MED ORDER — EZETIMIBE 10 MG PO TABS: ORAL_TABLET | ORAL | 3 refills | Status: DC

## 2021-08-09 MED ORDER — ATORVASTATIN CALCIUM 20 MG PO TABS: ORAL_TABLET | ORAL | 3 refills | Status: DC

## 2021-08-09 MED ORDER — OMEGA-3-ACID ETHYL ESTERS 1 G PO CAPS: 1.0000 | ORAL_CAPSULE | Freq: Two times a day (BID) | ORAL | 3 refills | Status: DC

## 2021-08-09 NOTE — Patient Instructions (Signed)
Medication Instructions:  No changes  If you need a refill on your cardiac medications before your next appointment, please call your pharmacy.   Lab work: No new labs needed  Testing/Procedures: No new testing needed  Follow-Up: At [facility], you and your health needs are our priority.  As part of our continuing mission to provide you with exceptional heart care, we have created designated Provider Care Teams.  These Care Teams include your primary Cardiologist (physician) and Advanced Practice Providers (APPs -  Physician Assistants and Nurse Practitioners) who all work together to provide you with the care you need, when you need it.  You will need a follow up appointment in 12 months  Providers on your designated Care Team:   Christopher Berge, NP Ryan Dunn, PA-C Cadence Furth, PA-C  COVID-19 Vaccine Information can be found at: [email or web address] For questions related to vaccine distribution or appointments, please email [email or web address] or call [phone].   

## 2021-08-19 ENCOUNTER — Other Ambulatory Visit: Payer: Self-pay | Admitting: Cardiovascular Disease

## 2022-02-14 ENCOUNTER — Ambulatory Visit (INDEPENDENT_AMBULATORY_CARE_PROVIDER_SITE_OTHER): Payer: Medicare HMO | Admitting: Emergency Medicine

## 2022-02-14 ENCOUNTER — Encounter: Payer: Self-pay | Admitting: Emergency Medicine

## 2022-02-14 DIAGNOSIS — J449 Chronic obstructive pulmonary disease, unspecified: Secondary | ICD-10-CM

## 2022-02-14 DIAGNOSIS — C3492 Malignant neoplasm of unspecified part of left bronchus or lung: Secondary | ICD-10-CM

## 2022-02-14 NOTE — Assessment & Plan Note (Signed)
Continue Anoro.  Albuterol as needed.

## 2022-02-14 NOTE — Progress Notes (Signed)
   Subjective:    Patient ID: [name] Kim, male    DOB: 01/22/1951, 71 y.o.   MRN: [id number]   HPI   ROV 05/05/21 --[name] Kim is 68 and has a history of tobacco use with associated COPD, HIV, CAD/CABG, hyperlipidemia, partial seizures.  Also rhinitis and GERD.  We are following a 9 x 6 mm left lower lobe pulmonary nodule first identified 11/29/2020.  He saw Dr. Kipp Brood 04/11/2021 and it was recommended that he would likely benefit most from SBRT if we believe the lesion is a malignancy. He is on Anoro, using albuterol about once a day, with exertion and yard work.   PFT done 04/11/21 reviewed by me show severe obstruction with FEV1 50% predicted.    ROV 02/14/2022 --follow-up visit 71 year old gentleman with history tobacco use, COPD, HIV, CAD/CABG, hyperlipidemia, partial seizures.  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  He reports that he feels great Has been managed on Anoro, uses Ventolin approximately few times a week, usually with heavy exertion, working outdoors.  CT chest 02/06/2022 reviewed by me shows that his left lower lobe pulmonary nodule is partially resolved, now with associated scar tenting the fissure.  There is a right upper lobe medial groundglass/hazy opacity of unclear significance, question pneumonitis.  New compared with my prior available scans.   Review of Systems As per HPI     Objective:   Physical Exam Vitals:   02/14/22 1056  BP: 122/72  Pulse: 66  Temp: 98 F (36.7 C)  TempSrc: Oral  SpO2: 97%  Weight: 137 lb 6.4 oz (62.3 kg)  Height: 5' 6.5" (1.689 m)    Gen: Pleasant, well-nourished, in no distress,  normal affect  ENT: No lesions,  mouth clear,  oropharynx clear, no postnasal drip  Neck: No JVD, no stridor  Lungs: No use of accessory muscles, no crackles or wheezing on normal respiration, no wheeze on forced expiration  Cardiovascular: RRR, heart sounds normal, no murmur or  gallops, no peripheral edema  Musculoskeletal: No deformities, no cyanosis or clubbing  Neuro: alert, awake, non focal  Skin: Warm, no lesions or rash      Assessment & Plan:   Squamous cell lung cancer, left (HCC) Stage I squamous cell lung cancer left lower lobe treated with XRT.  His repeat CT chest done in the [facility] system was reviewed.  The nodule appears to be mostly obliterated with some residual scarring tenting of the fissure.  There is a new nonspecific medial right upper lobe hazy opacity.  I will plan to repeat his CT scan of the chest at [place] in about 3 months.  He requests whether we could do this in January and we will arrange for this.  COPD (chronic obstructive pulmonary disease) (HCC) Continue Anoro.  Albuterol as needed.    Baltazar Apo, MD, PhD 02/14/2022, 11:22 AM [facility] [phone] or if no answer before 7:00PM call [phone] For any issues after 7:00PM please call eLink [phone]

## 2022-02-14 NOTE — Patient Instructions (Signed)
We reviewed your CT scan of the chest today. We will plan to repeat your CT chest without contrast in January 2024.  This will be done at [facility]. Continue your Anoro once daily. Keep your Ventolin available to use 2 puffs when needed for shortness of breath, chest tightness, wheezing. Follow Dr. Lamonte Sakai in January after your CT so we can review the results together.

## 2022-02-14 NOTE — Addendum Note (Signed)
Addended by: Gavin Potters R on: 02/14/2022 11:39 AM   Modules accepted: Orders

## 2022-02-14 NOTE — Assessment & Plan Note (Signed)
Stage I squamous cell lung cancer left lower lobe treated with XRT.  His repeat CT chest done in the [facility] system was reviewed.  The nodule appears to be mostly obliterated with some residual scarring tenting of the fissure.  There is a new nonspecific medial right upper lobe hazy opacity.  I will plan to repeat his CT scan of the chest at [place] in about 3 months.  He requests whether we could do this in January and we will arrange for this.

## 2022-02-15 ENCOUNTER — Telehealth: Payer: Self-pay | Admitting: Emergency Medicine

## 2022-02-27 NOTE — Telephone Encounter (Signed)
Patient called back and wants to make sure this is changed- informed patient we would give him a call when the location is changed.

## 2022-02-27 NOTE — Telephone Encounter (Signed)
Please advise if this can be changed for the patient. Thanks!

## 2022-03-03 NOTE — Telephone Encounter (Signed)
CT for 06/12/22 has been set up for [facility], however, pt wants it changed to be done at [facility]  Please let us know when we can let him know it's changed Thanks!

## 2022-03-05 ENCOUNTER — Other Ambulatory Visit: Payer: Self-pay | Admitting: Emergency Medicine

## 2022-03-06 ENCOUNTER — Other Ambulatory Visit: Payer: Self-pay | Admitting: Emergency Medicine

## 2022-03-22 ENCOUNTER — Other Ambulatory Visit: Payer: Medicare HMO

## 2022-03-22 ENCOUNTER — Other Ambulatory Visit: Payer: Self-pay

## 2022-03-22 DIAGNOSIS — B20 Human immunodeficiency virus [HIV] disease: Secondary | ICD-10-CM

## 2022-03-23 LAB — T-HELPER CELL (CD4) - (RCID CLINIC ONLY)
CD4 % Helper T Cell: 26 % — ABNORMAL LOW (ref 33–65)
CD4 T Cell Abs: 560 /uL (ref 400–1790)

## 2022-03-24 LAB — COMPREHENSIVE METABOLIC PANEL
AG Ratio: 1.8 (calc) (ref 1.0–2.5)
ALT: 16 U/L (ref 9–46)
AST: 17 U/L (ref 10–35)
Albumin: 4.8 g/dL (ref 3.6–5.1)
Alkaline phosphatase (APISO): 80 U/L (ref 35–144)
BUN/Creatinine Ratio: 10 (calc) (ref 6–22)
BUN: 13 mg/dL (ref 7–25)
CO2: 28 mmol/L (ref 20–32)
Calcium: 9.9 mg/dL (ref 8.6–10.3)
Chloride: 102 mmol/L (ref 98–110)
Creat: 1.31 mg/dL — ABNORMAL HIGH (ref 0.70–1.28)
Globulin: 2.6 g/dL (calc) (ref 1.9–3.7)
Glucose, Bld: 86 mg/dL (ref 65–99)
Potassium: 4.6 mmol/L (ref 3.5–5.3)
Sodium: 138 mmol/L (ref 135–146)
Total Bilirubin: 0.7 mg/dL (ref 0.2–1.2)
Total Protein: 7.4 g/dL (ref 6.1–8.1)

## 2022-03-24 LAB — CBC
HCT: 50.3 % — ABNORMAL HIGH (ref 38.5–50.0)
Hemoglobin: 17.9 g/dL — ABNORMAL HIGH (ref 13.2–17.1)
MCH: 36.1 pg — ABNORMAL HIGH (ref 27.0–33.0)
MCHC: 35.6 g/dL (ref 32.0–36.0)
MCV: 101.4 fL — ABNORMAL HIGH (ref 80.0–100.0)
MPV: 9.7 fL (ref 7.5–12.5)
Platelets: 236 10*3/uL (ref 140–400)
RBC: 4.96 10*6/uL (ref 4.20–5.80)
RDW: 11.9 % (ref 11.0–15.0)
WBC: 7.9 10*3/uL (ref 3.8–10.8)

## 2022-03-24 LAB — RPR: RPR Ser Ql: NONREACTIVE

## 2022-03-24 LAB — HIV-1 RNA QUANT-NO REFLEX-BLD
HIV 1 RNA Quant: NOT DETECTED Copies/mL
HIV-1 RNA Quant, Log: NOT DETECTED Log cps/mL

## 2022-03-24 LAB — LIPID PANEL
Cholesterol: 105 mg/dL (ref <200)
HDL: 45 mg/dL (ref >=40)
LDL Cholesterol (Calc): 42 mg/dL (calc)
Non-HDL Cholesterol (Calc): 60 mg/dL (calc) (ref <130)
Total CHOL/HDL Ratio: 2.3 (calc) (ref <5.0)
Triglycerides: 98 mg/dL (ref <150)

## 2022-03-30 ENCOUNTER — Other Ambulatory Visit: Payer: Self-pay | Admitting: Internal Medicine

## 2022-03-30 DIAGNOSIS — B20 Human immunodeficiency virus [HIV] disease: Secondary | ICD-10-CM

## 2022-03-30 NOTE — Telephone Encounter (Signed)
Pt has appt on 11/15

## 2022-04-02 ENCOUNTER — Other Ambulatory Visit: Payer: Self-pay | Admitting: Emergency Medicine

## 2022-04-05 ENCOUNTER — Other Ambulatory Visit: Payer: Self-pay

## 2022-04-05 ENCOUNTER — Ambulatory Visit (INDEPENDENT_AMBULATORY_CARE_PROVIDER_SITE_OTHER): Payer: Medicare HMO | Admitting: Internal Medicine

## 2022-04-05 ENCOUNTER — Encounter: Payer: Self-pay | Admitting: Internal Medicine

## 2022-04-05 VITALS — BP 153/69 | HR 53 | Resp 16 | Ht 66.5 in | Wt 137.0 lb

## 2022-04-05 DIAGNOSIS — B20 Human immunodeficiency virus [HIV] disease: Secondary | ICD-10-CM

## 2022-04-05 DIAGNOSIS — J449 Chronic obstructive pulmonary disease, unspecified: Secondary | ICD-10-CM | POA: Diagnosis not present

## 2022-04-05 DIAGNOSIS — N189 Chronic kidney disease, unspecified: Secondary | ICD-10-CM | POA: Diagnosis not present

## 2022-04-05 MED ORDER — BIKTARVY 50-200-25 MG PO TABS: 1.0000 | ORAL_TABLET | Freq: Every day | ORAL | 11 refills | Status: DC

## 2022-04-05 NOTE — Progress Notes (Signed)
Patient Active Problem List   Diagnosis Date Noted   Squamous cell lung cancer, left ([place]) 01/05/2021    Priority: High   COPD (chronic obstructive pulmonary disease) ([place]) 01/05/2021    Priority: High   Human immunodeficiency virus (HIV) disease ([place]) 01/23/2012    Priority: High   Tubular adenoma of colon 11/13/2018   Abdominal aortic aneurysm (AAA) without rupture ([place]) 01/17/2018   Macrocytosis without anemia 08/07/2017   Smoking hx 12/18/2016   Seasonal allergies 10/26/2016   Colon polyposis 03/01/2015   Loss of peripheral visual field, 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   Seasonal allergic rhinitis due to pollen 03/01/2015   Parasomnia 09/09/2014   Sleep behavior disorder, 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   Chronic renal insufficiency 10/31/2012   Other malaise and fatigue 10/29/2012   Weight loss, unintentional 10/29/2012   Dyslipidemia 02/08/2012   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   ALBUTEROL (VENTOLIN HFA) 108 (90 BASE) MCG/ACT INHALER    INHALE 1 PUFF INTO THE LUNGS EVERY 6 HOURS AS NEEDED FOR WHEEZING OR SHORTNESS OF BREATH   ANORO ELLIPTA 62.5-25 MCG/ACT AEPB    INHALE 1 PUFF INTO THE LUNGS DAILY   ASPIRIN 81 MG TABLET    Take 81 mg by mouth daily.   ATORVASTATIN (LIPITOR) 20 MG TABLET    TAKE 1 TABLET(20 MG) BY MOUTH DAILY AT 6 PM   COENZYME Q10 (COQ10 PO)    Take 1 capsule by mouth in the morning.   EZETIMIBE (ZETIA) 10 MG TABLET    TAKE 1 TABLET(10 MG) BY MOUTH DAILY   FOLIC ACID (FOLVITE) 671 MCG TABLET    Take 400 mcg by mouth in the morning.   LAMOTRIGINE (LAMICTAL) 100 MG TABLET    Take 100 mg  by mouth 2 (two) times daily.   OMEGA-3 ACID ETHYL ESTERS (LOVAZA) 1 G CAPSULE    TAKE 1 CAPSULE BY MOUTH TWICE DAILY   VITAMIN B-12 (CYANOCOBALAMIN) 500 MCG TABLET    Take 500 mcg by mouth in the morning.  Modified Medications   Modified Medication Previous Medication   BICTEGRAVIR-EMTRICITABINE-TENOFOVIR AF (BIKTARVY) 50-200-25 MG TABS TABLET bictegravir-emtricitabine-tenofovir AF (BIKTARVY) 50-200-25 MG TABS tablet      Take 1 tablet by mouth daily.    TAKE 1 TABLET BY MOUTH DAILY  Discontinued Medications   No medications on file    Subjective: [name] Kim is in for his routine HIV follow-up visit.  He completed radiation therapy for his lung cancer and is feeling much better.  He has COPD but tries to remain very active.  He rarely needs his rescue inhaler.  He denies any problems obtaining, taking or tolerating his Biktarvy and never misses any doses.  He has already had his influenza vaccine, updated COVID-vaccine and RSV vaccine.  Review of Systems: Review of Systems  Constitutional:  Negative for fever and weight loss.  Respiratory:  Positive for shortness of breath. Negative for cough.     Past Medical History:  Diagnosis Date   AAA (abdominal aortic aneurysm) (HCC)    Anemia    CAD (coronary artery disease)  Chronic kidney disease    COPD (chronic obstructive pulmonary disease) (HCC)    GERD (gastroesophageal reflux disease)    History of blood transfusion    x 3   HIV infection (HCC)    Hypercholesteremia    Hyperlipidemia    Seasonal allergies    Seizures ([place])    last one Oct 2003 petit mal   Sleep behavior disorder, 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    Social History   Tobacco Use   Smoking status: Former    Packs/day: 0.50    Types: Cigarettes    Quit date: 08/09/2005    Years since quitting: 16.6   Smokeless tobacco: Never  Vaping Use   Vaping Use: Never used  Substance Use Topics   Alcohol use: Yes    Comment: Social   Drug use: No    Family History  Problem  Relation Age of Onset   Cancer Mother    Cancer Father        leukemia    Allergies  Allergen Reactions   Zidovudine Other (See Comments) and Rash    Caused bone marrow problems  2000 Caused bone marrow problems  2000 Caused bone marrow problems  2000 Caused bone marrow problems  2000 Caused bone marrow problems  2000    Health Maintenance  Topic Date Due   COLONOSCOPY (Pts 45-23yrs Insurance coverage will need to be confirmed)  Never done   COVID-19 Vaccine (6 - Moderna risk series) 04/12/2021   Medicare Annual Wellness (AWV)  02/02/2023   TETANUS/TDAP  02/03/2028   Pneumonia Vaccine 17+ Years old  Completed   INFLUENZA VACCINE  Completed   Hepatitis C Screening  Completed   Zoster Vaccines- Shingrix  Completed   HPV VACCINES  Aged Out    Objective:  Vitals:   04/05/22 0925  BP: (!) 153/69  Pulse: (!) 53  Resp: 16  SpO2: 98%  Weight: 137 lb (62.1 kg)  Height: 5' 6.5" (1.689 m)   Body mass index is 21.78 kg/m.  Physical Exam Constitutional:      Comments: He is talkative and in good spirits.  He is accompanied by his partner, Joey.  Cardiovascular:     Rate and Rhythm: Normal rate.  Pulmonary:     Effort: Pulmonary effort is normal.  Psychiatric:        Mood and Affect: Mood normal.     Lab Results Lab Results  Component Value Date   WBC 7.9 03/22/2022   HGB 17.9 (H) 03/22/2022   HCT 50.3 (H) 03/22/2022   MCV 101.4 (H) 03/22/2022   PLT 236 03/22/2022    Lab Results  Component Value Date   CREATININE 1.31 (H) 03/22/2022   BUN 13 03/22/2022   NA 138 03/22/2022   K 4.6 03/22/2022   CL 102 03/22/2022   CO2 28 03/22/2022    Lab Results  Component Value Date   ALT 16 03/22/2022   AST 17 03/22/2022   ALKPHOS 73 05/31/2021   BILITOT 0.7 03/22/2022    Lab Results  Component Value Date   CHOL 105 03/22/2022   HDL 45 03/22/2022   LDLCALC 42 03/22/2022   LDLDIRECT 126.1 02/19/2013   TRIG 98 03/22/2022   CHOLHDL 2.3 03/22/2022   Lab Results   Component Value Date   LABRPR NON-REACTIVE 03/22/2022   HIV 1 RNA Quant (Copies/mL)  Date Value  03/22/2022 Not Detected  03/22/2021 Not Detected  03/25/2020 <20 (H)   CD4 T Cell Abs (/uL)  Date Value  03/22/2022 560  03/22/2021 603  03/25/2020 610     Problem List Items Addressed This Visit       High   Human immunodeficiency virus (HIV) disease ([place]) - Primary (Chronic)    His infection remains under excellent, long-term control.  He will continue Biktarvy and follow-up after lab work in 1 year.      Relevant Medications   bictegravir-emtricitabine-tenofovir AF (BIKTARVY) 50-200-25 MG TABS tablet   Other Relevant Orders   CBC   T-helper cells (CD4) count (not at [facility])   Comprehensive metabolic panel   Lipid panel   RPR   HIV-1 RNA quant-no reflex-bld   COPD (chronic obstructive pulmonary disease) ([place])    He was very active during the summer and early fall but says that he does not get out much when it is cold.  The cold weather does not cause any problems with his breathing but he does not like being cold.  I told him to wear a sweater and a coat and try to be active regardless of the weather.  He is appropriately vaccinated.        Unprioritized   Chronic renal insufficiency    His chronic renal insufficiency is stable.  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         Michel Bickers, MD [facility] for Infectious [place] [facility] [phone] pager   [phone] cell 04/05/2022, 10:09 AM

## 2022-04-05 NOTE — Assessment & Plan Note (Signed)
His infection remains under excellent, long-term control.  He will continue Biktarvy and follow-up after lab work in 1 year. 

## 2022-04-05 NOTE — Assessment & Plan Note (Signed)
His chronic renal insufficiency is stable.  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

## 2022-04-05 NOTE — Assessment & Plan Note (Signed)
He was very active during the summer and early fall but says that he does not get out much when it is cold.  The cold weather does not cause any problems with his breathing but he does not like being cold.  I told him to wear a sweater and a coat and try to be active regardless of the weather.  He is appropriately vaccinated.

## 2022-06-12 ENCOUNTER — Ambulatory Visit
Admission: RE | Admit: 2022-06-12 | Discharge: 2022-06-12 | Disposition: A | Discharge status: Home / Self Care | Payer: Medicare HMO | Source: Ambulatory Visit | Attending: Emergency Medicine | Admitting: Emergency Medicine

## 2022-06-12 DIAGNOSIS — C3492 Malignant neoplasm of unspecified part of left bronchus or lung: Secondary | ICD-10-CM | POA: Diagnosis not present

## 2022-06-15 ENCOUNTER — Ambulatory Visit (INDEPENDENT_AMBULATORY_CARE_PROVIDER_SITE_OTHER): Payer: Medicare HMO | Admitting: Emergency Medicine

## 2022-06-15 ENCOUNTER — Encounter: Payer: Self-pay | Admitting: Emergency Medicine

## 2022-06-15 VITALS — BP 140/76 | HR 55 | Temp 97.8°F | Ht 66.5 in | Wt 136.4 lb

## 2022-06-15 DIAGNOSIS — J449 Chronic obstructive pulmonary disease, unspecified: Secondary | ICD-10-CM | POA: Diagnosis not present

## 2022-06-15 DIAGNOSIS — C3492 Malignant neoplasm of unspecified part of left bronchus or lung: Secondary | ICD-10-CM | POA: Diagnosis not present

## 2022-06-15 NOTE — Assessment & Plan Note (Signed)
Continue Anoro, continue albuterol as needed

## 2022-06-15 NOTE — Assessment & Plan Note (Signed)
[name] Kim underwent surgical resection at [facility] in March 2023.  Follow-up imaging question the possible right midlung groundglass opacity.  This is resolved on his most recent scan.  He was worried about a possible recurrence but I tried to reassure him about this.  The scan does not show any evidence of recurrence.  We will plan to follow his CTs and review them, next to be done in 6 months.  We will do this at [facility].

## 2022-06-15 NOTE — Addendum Note (Signed)
Addended by: Gavin Potters R on: 06/15/2022 10:14 AM   Modules accepted: Orders

## 2022-06-15 NOTE — Progress Notes (Signed)
Subjective:    Patient ID: SABER DICKERMAN, male    DOB: 1950/06/04, 72 y.o.   MRN: [id number]   HPI   ROV 05/05/21 --[name] is 68 and has a history of tobacco use with associated COPD, HIV, CAD/CABG, hyperlipidemia, partial seizures.  Also rhinitis and GERD.  We are following a 9 x 6 mm left lower lobe pulmonary nodule first identified 11/29/2020.  He saw Dr. Kipp Brood 04/11/2021 and it was recommended that he would likely benefit most from SBRT if we believe the lesion is a malignancy. He is on Anoro, using albuterol about once a day, with exertion and yard work.   PFT done 04/11/21 reviewed by me show severe obstruction with FEV1 50% predicted.    ROV 02/14/2022 --follow-up visit 72 year old gentleman with history tobacco use, COPD, HIV, CAD/CABG, hyperlipidemia, partial seizures.  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  He reports that he feels great Has been managed on Anoro, uses Ventolin approximately few times a week, usually with heavy exertion, working outdoors.  CT chest 02/06/2022 reviewed by me shows that his left lower lobe pulmonary nodule is partially resolved, now with associated scar tenting the fissure.  There is a right upper lobe medial groundglass/hazy opacity of unclear significance, question pneumonitis.  New compared with my prior available scans.  ROV 06/15/22 --72 year old male with history tobacco use, COPD, HIV, CAD/CABG, partial seizures.  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  On his last CT scan from [facility] there was a new nonspecific medial right upper lobe hazy opacity.  We repeated his CT as below.  CT chest 06/12/2022 reviewed by me, shows centrilobular emphysema, mild left apical pleural thickening, bronchiectatic change in the lingula greater than the middle lobe, obliteration of the spiculated left lower lobe nodule, no new or recurrent mass  lesions. The Right medial GG opacity has resolved.    Review of Systems As per HPI     Objective:   Physical Exam Vitals:   06/15/22 0907  BP: (!) 140/76  Pulse: (!) 55  Temp: 97.8 F (36.6 C)  TempSrc: Oral  SpO2: 94%  Weight: 136 lb 6.4 oz (61.9 kg)  Height: 5' 6.5" (1.689 m)    Gen: Pleasant, well-nourished, in no distress,  normal affect  ENT: No lesions,  mouth clear,  oropharynx clear, no postnasal drip  Neck: No JVD, no stridor  Lungs: No use of accessory muscles, no crackles or wheezing on normal respiration, no wheeze on forced expiration  Cardiovascular: RRR, heart sounds normal, no murmur or gallops, no peripheral edema  Musculoskeletal: No deformities, no cyanosis or clubbing  Neuro: alert, awake, non focal  Skin: Warm, no lesions or rash      Assessment & Plan:   Squamous cell lung cancer, left (HCC) Gary underwent surgical resection at [facility] in March 2023.  Follow-up imaging question the possible right midlung groundglass opacity.  This is resolved on his most recent scan.  He was worried about a possible recurrence but I tried to reassure him about this.  The scan does not show any evidence of recurrence.  We will plan to follow his CTs and review them, next to be done in 6 months.  We will do this at [facility].  COPD (chronic obstructive pulmonary disease) (HCC) Continue Anoro, continue albuterol as needed    Baltazar Apo, MD, PhD 06/15/2022, 9:48 AM [facility] [phone] or if no answer before 7:00PM call [phone] For any issues after 7:00PM please call eLink [phone]

## 2022-06-15 NOTE — Patient Instructions (Addendum)
We reviewed your CT scan of the chest today.  This does not show any recurrence lung cancer.  Good news.  No other abnormalities noted except for some stable emphysema. We will plan to repeat your CT scan of the chest in July at [facility] Please continue your Anoro once daily. Keep albuterol available use 2 puffs if needed for shortness of breath, chest tightness, wheezing. Follow Dr. Lamonte Sakai in July after your CT chest so we can review the results together.

## 2022-06-22 ENCOUNTER — Other Ambulatory Visit: Payer: Self-pay | Admitting: Emergency Medicine

## 2022-06-23 NOTE — Telephone Encounter (Signed)
ATC no voicemail x 1

## 2022-06-27 NOTE — Telephone Encounter (Signed)
Patient checking on prior authorization for Anoro inhaler. Pharmacy is [facility] [address]. Patient phone number is [phone].

## 2022-06-28 NOTE — Telephone Encounter (Signed)
Called and spoke with the pt  He states that he is needing PA done for [employer] to PA team, thank you!

## 2022-07-05 ENCOUNTER — Other Ambulatory Visit (HOSPITAL_COMMUNITY): Payer: Self-pay

## 2022-07-05 ENCOUNTER — Telehealth: Payer: Self-pay

## 2022-07-05 MED ORDER — ANORO ELLIPTA 62.5-25 MCG/ACT IN AEPB: 1.0000 | INHALATION_SPRAY | Freq: Every day | RESPIRATORY_TRACT | 11 refills | Status: DC

## 2022-07-05 NOTE — Telephone Encounter (Signed)
PA has been submitted and will be updated in additional encounter created.

## 2022-07-05 NOTE — Telephone Encounter (Signed)
PA request received via patient/provider for Anoro Ellipta 62.5-25MCG/ACT aerosol powder  PA has been submitted via CMM to [facility] and is pending determination.  Key: [id number]

## 2022-07-05 NOTE — Telephone Encounter (Signed)
PA for [facility] has been DENIED:   The drug you asked for is not listed in your preferred drug list (formulary). The preferred drug(s), you may not have tried are: Charolotte Eke Aerosphere HFA aerosol inhaler. Your provider needs to give [place] medical reasons why the preferred drug(s) would not work for you and/or would have bad side effects.

## 2022-07-06 NOTE — Telephone Encounter (Signed)
Please advise on message from pharmacy sir:  PA for [facility] has been DENIED:    The drug you asked for is not listed in your preferred drug list (formulary). The preferred drug(s), you may not have tried are: Charolotte Eke Aerosphere HFA aerosol inhaler. Your provider needs to give [place] medical reasons why the preferred drug(s) would not work for you and/or would have bad side effects.

## 2022-07-06 NOTE — Telephone Encounter (Signed)
Patient is calling to follow up on his medication's PA.  He would like the doctor or nurse to call him with a solution as to what he can take.  Please advise and call patient to discuss further.  CB# [phone]

## 2022-07-06 NOTE — Telephone Encounter (Signed)
If he has not tried Bevespi then we need to try this, 2 puffs twice a day.

## 2022-07-07 MED ORDER — BEVESPI AEROSPHERE 9-4.8 MCG/ACT IN AERO: 2.0000 | INHALATION_SPRAY | Freq: Two times a day (BID) | RESPIRATORY_TRACT | 11 refills | Status: DC

## 2022-07-07 NOTE — Telephone Encounter (Signed)
Patient is returning phone call. Patient phone number is [phone].

## 2022-07-07 NOTE — Telephone Encounter (Signed)
Spoke with the pt and notified of response from Dr Lamonte Sakai  He verbalized understanding   Rx for Bevespi was sent to pharm  Nothing further needed

## 2022-07-07 NOTE — Telephone Encounter (Signed)
Lm x1 for patient.  

## 2022-08-11 ENCOUNTER — Other Ambulatory Visit: Payer: Self-pay | Admitting: Cardiovascular Disease

## 2022-08-15 ENCOUNTER — Other Ambulatory Visit: Payer: Self-pay | Admitting: Cardiovascular Disease

## 2022-08-28 NOTE — Progress Notes (Signed)
Cardiology Office Note  Date:  08/29/2022   ID:  Tobias AlexanderGarry W Deandrade, DOB 06/24/1950, MRN [id number]  PCP:  Tracey HarriesBouska, David, MD   Chief Complaint  Patient presents with   Follow-up    12 month f/u.  Patient denies new or acute cardiac problems/concerns today.      HPI:  Mr. Shon BatonBrooks is a pleasant 72 year-old gentleman with a history of  coronary artery disease,  bypass surgery in 2007,  chronic HIV infection on antiviral therapy, Long history of smoking, Centrilobular emphysema Small AAA, 3.2 cm in March 2024 Squamous cell lung cancer on left, [facility]23 Chronic kidney disease stage III who presents for routine follow-up of his coronary artery disease, CABG  Last seen March 2023 Recovered from lung cancer surgery on left March 2023 Active but no regular exercise program Followed by pulmonary and oncology  History of Asymptomatic bradycardia, not on beta-blockers or AV nodal blocking agents  Active, gardens, yesterday spent several hours picking up sticks Seasonal allergies  Denies chest pain concerning for angina  Lab work reviewed Total cholesterol 105 LDL 42 Creatinine 1.3  EKG personally reviewed by myself on todays visit Sinus bradycardia rate 51 bpm no ST or T wave changes  Other past medical hx reviewed  abdominal aortic ultrasound showing small AAA dated 09/2017 Distal abdominal aorta measuring 3.6 cm in greatest dimension.  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  PSH:     Past Surgical History:  Procedure Laterality Date   APPENDECTOMY  1970   BRONCHIAL BIOPSY  05/31/2021   Procedure: BRONCHIAL BIOPSIES;  Surgeon: Leslye PeerByrum, Robert S, MD;  [facility];  Service: Pulmonary;;   BRONCHIAL BRUSHINGS  05/31/2021   Procedure: BRONCHIAL BRUSHINGS;  Surgeon: Leslye PeerByrum, Robert S, MD;  [facility];  Service: Pulmonary;;   BRONCHIAL NEEDLE ASPIRATION BIOPSY  05/31/2021   Procedure: BRONCHIAL NEEDLE ASPIRATION BIOPSIES;  Surgeon: Leslye PeerByrum, Robert S, MD;  [facility];  Service: Pulmonary;;   CATARACT EXTRACTION Bilateral    CATARACT EXTRACTION W/PHACO Right 02/03/2020   Procedure: CATARACT EXTRACTION PHACO AND INTRAOCULAR LENS PLACEMENT (IOC) RIGHT 4.30 00:28.6;  Surgeon: Galen ManilaPorfilio, William, MD;  Location: [facility];  Service: Ophthalmology;  Laterality: Right;   CATARACT EXTRACTION W/PHACO Left 02/24/2020   Procedure: CATARACT EXTRACTION PHACO AND INTRAOCULAR LENS PLACEMENT (IOC) LEFT 5.58 00:32.2;  Surgeon: Galen ManilaPorfilio, William, MD;  [facility];  Service: Ophthalmology;  Laterality: Left;   CHOLECYSTECTOMY     COLONOSCOPY     CORONARY ARTERY BYPASS GRAFT  08/09/2005   LIMA-LAD, SVG-diagonal, SVG-OM   FIDUCIAL MARKER PLACEMENT  05/31/2021   Procedure: FIDUCIAL MARKER PLACEMENT;  Surgeon: Leslye PeerByrum, Robert S, MD;  [facility];  Service: Pulmonary;;   TONSILECTOMY, ADENOIDECTOMY, BILATERAL MYRINGOTOMY AND TUBES  08/1964   VIDEO BRONCHOSCOPY WITH RADIAL ENDOBRONCHIAL ULTRASOUND  05/31/2021   Procedure: VIDEO BRONCHOSCOPY WITH RADIAL ENDOBRONCHIAL ULTRASOUND;  Surgeon: Leslye PeerByrum, Robert S, MD;  Location: [facility] ENDOSCOPY;  Service: Pulmonary;;    Current Outpatient Medications  Medication Sig Dispense Refill   acetaminophen (TYLENOL) 500 MG tablet Take 250 mg by mouth  every 6 (six) hours as needed (pain).     albuterol (VENTOLIN HFA) 108 (90 Base) MCG/ACT inhaler INHALE 1 PUFF INTO THE LUNGS EVERY 6 HOURS AS NEEDED FOR WHEEZING  OR SHORTNESS OF BREATH 6.7 g 2   aspirin 81 MG tablet Take 81 mg by mouth daily.     bictegravir-emtricitabine-tenofovir AF (BIKTARVY) 50-200-25 MG TABS tablet Take 1 tablet by mouth daily. 30 tablet 11   Coenzyme Q10 (COQ10 PO) Take 1 capsule by mouth in the morning.     folic acid (FOLVITE) 400 MCG tablet Take 400 mcg by mouth in the morning.     Glycopyrrolate-Formoterol (BEVESPI AEROSPHERE) 9-4.8 MCG/ACT AERO Inhale 2 puffs into the lungs 2 (two) times daily. 10.7 g 11   lamoTRIgine (LAMICTAL) 100 MG tablet Take 100 mg by mouth 2 (two) times daily.     omega-3 acid ethyl esters (LOVAZA) 1 g capsule TAKE 1 CAPSULE BY MOUTH TWICE DAILY 180 capsule 0   atorvastatin (LIPITOR) 20 MG tablet One pill daily 90 tablet 3   ezetimibe (ZETIA) 10 MG tablet TAKE 1 TABLET(10 MG) BY MOUTH DAILY 90 tablet 3   vitamin B-12 (CYANOCOBALAMIN) 500 MCG tablet Take 500 mcg by mouth in the morning. (Patient not taking: Reported on 08/29/2022)     No current facility-administered medications for this visit.     Allergies:   Zidovudine   Social History:  The patient  reports that he quit smoking about 17 years ago. His smoking use included cigarettes. He smoked an average of .5 packs per day. He has never used smokeless tobacco. He reports current alcohol use. He reports that he does not use drugs.   Family History:   family history includes Cancer in his father and mother.    Review of Systems: Review of Systems  Constitutional: Negative.   Respiratory: Negative.    Cardiovascular: Negative.   Gastrointestinal: Negative.   Musculoskeletal: Negative.   Neurological: Negative.   Psychiatric/Behavioral: Negative.    All other systems reviewed and are negative.   PHYSICAL EXAM: VS:  BP (!) 140/80 (BP Location: Left Arm, Patient Position: Sitting)   Pulse (!) 51   Ht 5' 6.5" (1.689 m)   Wt 140 lb 3.2 oz (63.6 kg)   SpO2 98%   BMI 22.29 kg/m  , BMI Body mass index is 22.29 kg/m. Constitutional:   oriented to person, place, and time. No distress.  HENT:  Head: Grossly normal Eyes:  no discharge. No scleral icterus.  Neck: No JVD, no carotid bruits  Cardiovascular: Regular rate and rhythm, no murmurs appreciated Pulmonary/Chest: Clear to auscultation bilaterally, no wheezes or rails Abdominal: Soft.  no distension.  no tenderness.  Musculoskeletal: Normal range of motion Neurological:  normal muscle tone. Coordination normal. No atrophy Skin: Skin warm and dry Psychiatric: normal affect, pleasant   Recent Labs: 03/22/2022: ALT 16; BUN 13; Creat 1.31; Hemoglobin 17.9; Platelets 236; Potassium 4.6; Sodium 138    Lipid Panel Lab Results  Component Value Date   CHOL 105 03/22/2022   HDL 45 03/22/2022   LDLCALC 42 03/22/2022   TRIG 98 03/22/2022     Filed Weights   08/29/22 0830  Weight: 140 lb 3.2 oz (63.6 kg)     ASSESSMENT AND PLAN:  Coronary artery disease involving native coronary artery of native heart without angina pectoris - Currently with no symptoms of angina. No further workup at this time. Continue current medication regimen. On Zetia 10 mg , Lipitor 20 goal cholesterol at  goal  Human immunodeficiency virus (HIV) disease (HCC) Managed by ID, stable  Dyslipidemia Cholesterol is at goal on the current lipid regimen. No changes to the medications were made.  S/P CABG (coronary artery bypass graft) 17 years since CABG, stable Non-smoker, no diabetes, cholesterol at goal  Essential hypertension Blood pressure typically always elevated on first arrival  Typically well-controlled   Total encounter time more than 30 minutes  Greater than 50% was spent in counseling and coordination of care with the patient    Orders Placed This Encounter  Procedures   EKG 12-Lead     Signed, Dossie Arbour, M.D., Ph.D. 08/29/2022  [facility] [place], [place] [phone]

## 2022-08-29 ENCOUNTER — Ambulatory Visit: Payer: Medicare HMO | Attending: Cardiovascular Disease | Admitting: Cardiovascular Disease

## 2022-08-29 ENCOUNTER — Encounter: Payer: Self-pay | Admitting: Cardiovascular Disease

## 2022-08-29 VITALS — BP 135/80 | HR 51 | Ht 66.5 in | Wt 140.2 lb

## 2022-08-29 DIAGNOSIS — Z87891 Personal history of nicotine dependence: Secondary | ICD-10-CM

## 2022-08-29 DIAGNOSIS — I714 Abdominal aortic aneurysm, without rupture, unspecified: Secondary | ICD-10-CM

## 2022-08-29 DIAGNOSIS — I25118 Atherosclerotic heart disease of native coronary artery with other forms of angina pectoris: Secondary | ICD-10-CM | POA: Diagnosis not present

## 2022-08-29 DIAGNOSIS — E785 Hyperlipidemia, unspecified: Secondary | ICD-10-CM

## 2022-08-29 DIAGNOSIS — B20 Human immunodeficiency virus [HIV] disease: Secondary | ICD-10-CM

## 2022-08-29 DIAGNOSIS — J449 Chronic obstructive pulmonary disease, unspecified: Secondary | ICD-10-CM

## 2022-08-29 DIAGNOSIS — Z951 Presence of aortocoronary bypass graft: Secondary | ICD-10-CM

## 2022-08-29 DIAGNOSIS — I10 Essential (primary) hypertension: Secondary | ICD-10-CM

## 2022-08-29 DIAGNOSIS — N189 Chronic kidney disease, unspecified: Secondary | ICD-10-CM

## 2022-08-29 MED ORDER — ATORVASTATIN CALCIUM 20 MG PO TABS: ORAL_TABLET | ORAL | 3 refills | Status: DC

## 2022-08-29 MED ORDER — EZETIMIBE 10 MG PO TABS: ORAL_TABLET | ORAL | 3 refills | Status: DC

## 2022-08-29 NOTE — Patient Instructions (Addendum)
Medication Instructions:  No changes  OTC fluticasone nasal spray for allergy  If you need a refill on your cardiac medications before your next appointment, please call your pharmacy.   Lab work: No new labs needed  Testing/Procedures: No new testing needed  Follow-Up: At [facility], you and your health needs are our priority.  As part of our continuing mission to provide you with exceptional heart care, we have created designated Provider Care Teams.  These Care Teams include your primary Cardiologist (physician) and Advanced Practice Providers (APPs -  Physician Assistants and Nurse Practitioners) who all work together to provide you with the care you need, when you need it.  You will need a follow up appointment in 12 months  Providers on your designated Care Team:   Nicolasa Ducking, NP Eula Listen, PA-C Cadence Fransico Michael, [place]  COVID-19 Vaccine Information can be found at: [email or web address] For questions related to vaccine distribution or appointments, please email [email or web address] or call [phone].

## 2022-09-11 ENCOUNTER — Other Ambulatory Visit: Payer: Self-pay | Admitting: Cardiovascular Disease

## 2022-09-12 ENCOUNTER — Telehealth: Payer: Self-pay | Admitting: Emergency Medicine

## 2022-09-12 NOTE — Telephone Encounter (Signed)
Pt. Calling that the [name] Kim is not working and [name] Kim is not going to cover it and want to be put on a different inhaler

## 2022-09-13 NOTE — Telephone Encounter (Signed)
Can you run a ticket on patients insurance to see what's covered besides breztri  Please route message back to triage

## 2022-09-14 ENCOUNTER — Other Ambulatory Visit (HOSPITAL_COMMUNITY): Payer: Self-pay

## 2022-09-14 NOTE — Telephone Encounter (Signed)
Left message for patient to call back. Need to know if he is on Breztri or Bevespi since [place] has never been prescribed by [facility] before.

## 2022-09-14 NOTE — Telephone Encounter (Signed)
Per test claim, both triple therapy inhalers, Trelegy and Markus Daft, are covered by insurance and each have a $0.00 co-pay

## 2022-09-14 NOTE — Telephone Encounter (Signed)
Pt called the office stating that he has been on Bevespi as he had to be switched to this from Anoro due to the Anoro not being covered by his insurance.  Pt said he has been having side effects from being on the Bevespi inhaler stating that his stomach has been in knots a lot while taking it, been having hot flashes after being on it, and due to the side effects, he needs to go back on Anoro.   Stated to pt that we will have our prior auth team do a prior auth to see if we can get the Anoro approved since he did try the Bevespi which was covered by his insurance and is not able to tolerate it.  Routing to prior [place] team.

## 2022-09-15 ENCOUNTER — Other Ambulatory Visit (HOSPITAL_COMMUNITY): Payer: Self-pay

## 2022-09-15 ENCOUNTER — Telehealth: Payer: Self-pay

## 2022-09-15 NOTE — Telephone Encounter (Signed)
PA has been submitted with failure of Bevespi noted. Will be updated in additional encounter created.

## 2022-09-15 NOTE — Telephone Encounter (Signed)
PA request received via provider for Anoro Ellipta 62.5-25MCG/ACT aerosol powder  *Patient has had failure with preferred alternative of  Bevespi  PA has been submitted to [facility] and is pending determination  Key: [id number]

## 2022-09-17 ENCOUNTER — Encounter (HOSPITAL_COMMUNITY): Payer: Self-pay

## 2022-09-17 ENCOUNTER — Inpatient Hospital Stay (HOSPITAL_COMMUNITY)
Admission: EM | Admit: 2022-09-17 | Discharge: 2022-09-19 | DRG: 190 | Disposition: A | Discharge status: Home / Self Care | Payer: Medicare HMO | Attending: Internal Medicine | Admitting: Internal Medicine

## 2022-09-17 ENCOUNTER — Emergency Department (HOSPITAL_COMMUNITY): Payer: Medicare HMO

## 2022-09-17 ENCOUNTER — Other Ambulatory Visit: Payer: Self-pay

## 2022-09-17 DIAGNOSIS — I2510 Atherosclerotic heart disease of native coronary artery without angina pectoris: Secondary | ICD-10-CM | POA: Diagnosis present

## 2022-09-17 DIAGNOSIS — J440 Chronic obstructive pulmonary disease with acute lower respiratory infection: Secondary | ICD-10-CM | POA: Diagnosis not present

## 2022-09-17 DIAGNOSIS — Z1152 Encounter for screening for COVID-19: Secondary | ICD-10-CM | POA: Diagnosis not present

## 2022-09-17 DIAGNOSIS — Z9841 Cataract extraction status, right eye: Secondary | ICD-10-CM | POA: Diagnosis not present

## 2022-09-17 DIAGNOSIS — N1831 Chronic kidney disease, stage 3a: Secondary | ICD-10-CM | POA: Diagnosis not present

## 2022-09-17 DIAGNOSIS — Z888 Allergy status to other drugs, medicaments and biological substances status: Secondary | ICD-10-CM | POA: Diagnosis not present

## 2022-09-17 DIAGNOSIS — E7800 Pure hypercholesterolemia, unspecified: Secondary | ICD-10-CM | POA: Diagnosis present

## 2022-09-17 DIAGNOSIS — Z9842 Cataract extraction status, left eye: Secondary | ICD-10-CM

## 2022-09-17 DIAGNOSIS — R000 Tachycardia, unspecified: Secondary | ICD-10-CM | POA: Diagnosis present

## 2022-09-17 DIAGNOSIS — Z720 Tobacco use: Secondary | ICD-10-CM | POA: Diagnosis not present

## 2022-09-17 DIAGNOSIS — Z85118 Personal history of other malignant neoplasm of bronchus and lung: Secondary | ICD-10-CM

## 2022-09-17 DIAGNOSIS — J209 Acute bronchitis, unspecified: Secondary | ICD-10-CM | POA: Diagnosis present

## 2022-09-17 DIAGNOSIS — Z961 Presence of intraocular lens: Secondary | ICD-10-CM | POA: Diagnosis present

## 2022-09-17 DIAGNOSIS — B20 Human immunodeficiency virus [HIV] disease: Secondary | ICD-10-CM | POA: Diagnosis not present

## 2022-09-17 DIAGNOSIS — E785 Hyperlipidemia, unspecified: Secondary | ICD-10-CM | POA: Diagnosis not present

## 2022-09-17 DIAGNOSIS — I714 Abdominal aortic aneurysm, without rupture, unspecified: Secondary | ICD-10-CM | POA: Diagnosis present

## 2022-09-17 DIAGNOSIS — K219 Gastro-esophageal reflux disease without esophagitis: Secondary | ICD-10-CM | POA: Diagnosis present

## 2022-09-17 DIAGNOSIS — R0602 Shortness of breath: Secondary | ICD-10-CM | POA: Diagnosis present

## 2022-09-17 DIAGNOSIS — Z902 Acquired absence of lung [part of]: Secondary | ICD-10-CM | POA: Diagnosis not present

## 2022-09-17 DIAGNOSIS — D72829 Elevated white blood cell count, unspecified: Secondary | ICD-10-CM | POA: Diagnosis not present

## 2022-09-17 DIAGNOSIS — Z951 Presence of aortocoronary bypass graft: Secondary | ICD-10-CM

## 2022-09-17 DIAGNOSIS — J9601 Acute respiratory failure with hypoxia: Secondary | ICD-10-CM | POA: Diagnosis present

## 2022-09-17 DIAGNOSIS — J9611 Chronic respiratory failure with hypoxia: Secondary | ICD-10-CM | POA: Diagnosis present

## 2022-09-17 DIAGNOSIS — Z7982 Long term (current) use of aspirin: Secondary | ICD-10-CM

## 2022-09-17 DIAGNOSIS — Z87891 Personal history of nicotine dependence: Secondary | ICD-10-CM

## 2022-09-17 DIAGNOSIS — J441 Chronic obstructive pulmonary disease with (acute) exacerbation: Secondary | ICD-10-CM | POA: Diagnosis present

## 2022-09-17 DIAGNOSIS — J9602 Acute respiratory failure with hypercapnia: Secondary | ICD-10-CM | POA: Diagnosis present

## 2022-09-17 DIAGNOSIS — G40A09 Absence epileptic syndrome, not intractable, without status epilepticus: Secondary | ICD-10-CM | POA: Diagnosis present

## 2022-09-17 DIAGNOSIS — Z9049 Acquired absence of other specified parts of digestive tract: Secondary | ICD-10-CM

## 2022-09-17 DIAGNOSIS — N183 Chronic kidney disease, stage 3 unspecified: Secondary | ICD-10-CM | POA: Diagnosis present

## 2022-09-17 DIAGNOSIS — G40909 Epilepsy, unspecified, not intractable, without status epilepticus: Secondary | ICD-10-CM | POA: Diagnosis not present

## 2022-09-17 DIAGNOSIS — Z21 Asymptomatic human immunodeficiency virus [HIV] infection status: Secondary | ICD-10-CM | POA: Diagnosis present

## 2022-09-17 DIAGNOSIS — Z79899 Other long term (current) drug therapy: Secondary | ICD-10-CM

## 2022-09-17 LAB — I-STAT ARTERIAL BLOOD GAS, ED
Acid-base deficit: 2 mmol/L (ref 0.0–2.0)
Bicarbonate: 23.4 mmol/L (ref 20.0–28.0)
Calcium, Ion: 1.28 mmol/L (ref 1.15–1.40)
HCT: 43 % (ref 39.0–52.0)
Hemoglobin: 14.6 g/dL (ref 13.0–17.0)
O2 Saturation: 94 %
Patient temperature: 98.6
Potassium: 4.4 mmol/L (ref 3.5–5.1)
Sodium: 136 mmol/L (ref 135–145)
TCO2: 25 mmol/L (ref 22–32)
pCO2 arterial: 41.8 mmHg (ref 32–48)
pH, Arterial: 7.357 (ref 7.35–7.45)
pO2, Arterial: 72 mmHg — ABNORMAL LOW (ref 83–108)

## 2022-09-17 LAB — COMPREHENSIVE METABOLIC PANEL
ALT: 20 U/L (ref 0–44)
AST: 23 U/L (ref 15–41)
Albumin: 4.4 g/dL (ref 3.5–5.0)
Alkaline Phosphatase: 64 U/L (ref 38–126)
Anion gap: 8 (ref 5–15)
BUN: 12 mg/dL (ref 8–23)
CO2: 27 mmol/L (ref 22–32)
Calcium: 9.5 mg/dL (ref 8.9–10.3)
Chloride: 102 mmol/L (ref 98–111)
Creatinine, Ser: 1.35 mg/dL — ABNORMAL HIGH (ref 0.61–1.24)
GFR, Estimated: 56 mL/min — ABNORMAL LOW (ref >60)
Glucose, Bld: 153 mg/dL — ABNORMAL HIGH (ref 70–99)
Potassium: 4.5 mmol/L (ref 3.5–5.1)
Sodium: 137 mmol/L (ref 135–145)
Total Bilirubin: 1.2 mg/dL (ref 0.3–1.2)
Total Protein: 7.6 g/dL (ref 6.5–8.1)

## 2022-09-17 LAB — RESPIRATORY PANEL BY PCR

## 2022-09-17 LAB — SARS CORONAVIRUS 2 BY RT PCR: SARS Coronavirus 2 by RT PCR: NOT DETECTED — AB

## 2022-09-17 LAB — I-STAT VENOUS BLOOD GAS, ED
Acid-base deficit: 1 mmol/L (ref 0.0–2.0)
Acid-base deficit: 4 mmol/L — ABNORMAL HIGH (ref 0.0–2.0)
Bicarbonate: 26.2 mmol/L (ref 20.0–28.0)
Bicarbonate: 29.7 mmol/L — ABNORMAL HIGH (ref 20.0–28.0)
Calcium, Ion: 1.2 mmol/L (ref 1.15–1.40)
Calcium, Ion: 1.29 mmol/L (ref 1.15–1.40)
HCT: 44 % (ref 39.0–52.0)
HCT: 50 % (ref 39.0–52.0)
Hemoglobin: 15 g/dL (ref 13.0–17.0)
Hemoglobin: 17 g/dL (ref 13.0–17.0)
O2 Saturation: 74 %
O2 Saturation: 88 %
Potassium: 4.4 mmol/L (ref 3.5–5.1)
Potassium: 4.8 mmol/L (ref 3.5–5.1)
Sodium: 136 mmol/L (ref 135–145)
Sodium: 138 mmol/L (ref 135–145)
TCO2: 28 mmol/L (ref 22–32)
TCO2: 32 mmol/L (ref 22–32)
pCO2, Ven: 67.2 mmHg — ABNORMAL HIGH (ref 44–60)
pCO2, Ven: 73.9 mmHg (ref 44–60)
pH, Ven: 7.199 — CL (ref 7.25–7.43)
pH, Ven: 7.212 — ABNORMAL LOW (ref 7.25–7.43)
pO2, Ven: 50 mmHg — ABNORMAL HIGH (ref 32–45)
pO2, Ven: 68 mmHg — ABNORMAL HIGH (ref 32–45)

## 2022-09-17 LAB — PROCALCITONIN: Procalcitonin: <0.1 ng/mL

## 2022-09-17 LAB — I-STAT CHEM 8, ED
BUN: 13 mg/dL (ref 8–23)
Calcium, Ion: 1.3 mmol/L (ref 1.15–1.40)
Chloride: 103 mmol/L (ref 98–111)
Creatinine, Ser: 1.3 mg/dL — ABNORMAL HIGH (ref 0.61–1.24)
Glucose, Bld: 148 mg/dL — ABNORMAL HIGH (ref 70–99)
HCT: 51 % (ref 39.0–52.0)
Hemoglobin: 17.3 g/dL — ABNORMAL HIGH (ref 13.0–17.0)
Potassium: 4.7 mmol/L (ref 3.5–5.1)
Sodium: 138 mmol/L (ref 135–145)
TCO2: 28 mmol/L (ref 22–32)

## 2022-09-17 LAB — CBC WITH DIFFERENTIAL/PLATELET
Abs Immature Granulocytes: 0.04 10*3/uL (ref 0.00–0.07)
Basophils Absolute: 0.1 10*3/uL (ref 0.0–0.1)
Basophils Relative: 1 %
Eosinophils Absolute: 0.6 10*3/uL — ABNORMAL HIGH (ref 0.0–0.5)
Eosinophils Relative: 4 %
HCT: 49 % (ref 39.0–52.0)
Hemoglobin: 16.5 g/dL (ref 13.0–17.0)
Immature Granulocytes: 0 %
Lymphocytes Relative: 28 %
Lymphs Abs: 3.9 10*3/uL (ref 0.7–4.0)
MCH: 34.8 pg — ABNORMAL HIGH (ref 26.0–34.0)
MCHC: 33.7 g/dL (ref 30.0–36.0)
MCV: 103.4 fL — ABNORMAL HIGH (ref 80.0–100.0)
Monocytes Absolute: 1.7 10*3/uL — ABNORMAL HIGH (ref 0.1–1.0)
Monocytes Relative: 12 %
Neutro Abs: 7.9 10*3/uL — ABNORMAL HIGH (ref 1.7–7.7)
Neutrophils Relative %: 55 %
Platelets: 217 10*3/uL (ref 150–400)
RBC: 4.74 MIL/uL (ref 4.22–5.81)
RDW: 12.5 % (ref 11.5–15.5)
WBC: 14.3 10*3/uL — ABNORMAL HIGH (ref 4.0–10.5)
nRBC: 0 % (ref 0.0–0.2)

## 2022-09-17 LAB — BRAIN NATRIURETIC PEPTIDE: B Natriuretic Peptide: 31.1 pg/mL (ref 0.0–100.0)

## 2022-09-17 LAB — LACTIC ACID, PLASMA
Lactic Acid, Venous: 1.2 mmol/L (ref 0.5–1.9)
Lactic Acid, Venous: 1.8 mmol/L (ref 0.5–1.9)

## 2022-09-17 LAB — TROPONIN I (HIGH SENSITIVITY)
Troponin I (High Sensitivity): 6 ng/L (ref <18)
Troponin I (High Sensitivity): 8 ng/L (ref <18)

## 2022-09-17 MED ORDER — ONDANSETRON HCL 4 MG PO TABS: 4.0000 mg | ORAL_TABLET | Freq: Four times a day (QID) | ORAL | Status: DC | PRN

## 2022-09-17 MED ORDER — GUAIFENESIN ER 600 MG PO TB12
600.0000 mg | ORAL_TABLET | Freq: Two times a day (BID) | ORAL | Status: DC
  Administered 2022-09-17 – 2022-09-19 (×5): 600 mg via ORAL
  Filled 2022-09-17 (×5): qty 1

## 2022-09-17 MED ORDER — IPRATROPIUM-ALBUTEROL 0.5-2.5 (3) MG/3ML IN SOLN
3.0000 mL | Freq: Four times a day (QID) | RESPIRATORY_TRACT | Status: DC
  Filled 2022-09-17: qty 3

## 2022-09-17 MED ORDER — LORAZEPAM 0.5 MG PO TABS
0.5000 mg | ORAL_TABLET | ORAL | Status: DC | PRN
  Administered 2022-09-17 – 2022-09-18 (×3): 0.5 mg via ORAL
  Filled 2022-09-17 (×3): qty 1

## 2022-09-17 MED ORDER — SODIUM CHLORIDE 0.9 % IV SOLN
500.0000 mg | INTRAVENOUS | Status: AC
  Administered 2022-09-17: 500 mg via INTRAVENOUS
  Filled 2022-09-17: qty 5

## 2022-09-17 MED ORDER — AZITHROMYCIN 500 MG PO TABS
500.0000 mg | ORAL_TABLET | Freq: Every day | ORAL | Status: DC
  Administered 2022-09-18 – 2022-09-19 (×2): 500 mg via ORAL
  Filled 2022-09-17 (×2): qty 1

## 2022-09-17 MED ORDER — LAMOTRIGINE 100 MG PO TABS
100.0000 mg | ORAL_TABLET | Freq: Two times a day (BID) | ORAL | Status: DC
  Administered 2022-09-17 – 2022-09-19 (×5): 100 mg via ORAL
  Filled 2022-09-17: qty 1
  Filled 2022-09-17: qty 4
  Filled 2022-09-17 (×3): qty 1
  Filled 2022-09-17: qty 4

## 2022-09-17 MED ORDER — BICTEGRAVIR-EMTRICITAB-TENOFOV 50-200-25 MG PO TABS
1.0000 | ORAL_TABLET | Freq: Every day | ORAL | Status: DC
  Administered 2022-09-17 – 2022-09-18 (×2): 1 via ORAL
  Filled 2022-09-17 (×4): qty 1

## 2022-09-17 MED ORDER — NITROGLYCERIN 2 % TD OINT
1.0000 [in_us] | TOPICAL_OINTMENT | Freq: Once | TRANSDERMAL | Status: AC
  Administered 2022-09-17: 1 [in_us] via TOPICAL
  Filled 2022-09-17: qty 1

## 2022-09-17 MED ORDER — ENOXAPARIN SODIUM 40 MG/0.4ML IJ SOSY
40.0000 mg | PREFILLED_SYRINGE | INTRAMUSCULAR | Status: DC
  Administered 2022-09-17 – 2022-09-18 (×2): 40 mg via SUBCUTANEOUS
  Filled 2022-09-17 (×2): qty 0.4

## 2022-09-17 MED ORDER — ALBUTEROL SULFATE (2.5 MG/3ML) 0.083% IN NEBU
2.5000 mg | INHALATION_SOLUTION | RESPIRATORY_TRACT | Status: DC | PRN
  Administered 2022-09-18: 2.5 mg via RESPIRATORY_TRACT
  Filled 2022-09-17: qty 3

## 2022-09-17 MED ORDER — IOHEXOL 350 MG/ML SOLN
75.0000 mL | Freq: Once | INTRAVENOUS | Status: AC | PRN
  Administered 2022-09-17: 75 mL via INTRAVENOUS

## 2022-09-17 MED ORDER — IPRATROPIUM-ALBUTEROL 0.5-2.5 (3) MG/3ML IN SOLN
3.0000 mL | RESPIRATORY_TRACT | Status: DC
  Administered 2022-09-17 (×4): 3 mL via RESPIRATORY_TRACT
  Filled 2022-09-17 (×3): qty 3

## 2022-09-17 MED ORDER — FOLIC ACID 1 MG PO TABS
500.0000 ug | ORAL_TABLET | Freq: Every morning | ORAL | Status: DC
  Administered 2022-09-17 – 2022-09-19 (×3): 0.5 mg via ORAL
  Filled 2022-09-17 (×3): qty 1

## 2022-09-17 MED ORDER — SODIUM CHLORIDE 0.9% FLUSH
3.0000 mL | Freq: Two times a day (BID) | INTRAVENOUS | Status: DC
  Administered 2022-09-17 – 2022-09-18 (×4): 3 mL via INTRAVENOUS

## 2022-09-17 MED ORDER — ACETAMINOPHEN 650 MG RE SUPP: 650.0000 mg | Freq: Four times a day (QID) | RECTAL | Status: DC | PRN

## 2022-09-17 MED ORDER — ALBUTEROL SULFATE (2.5 MG/3ML) 0.083% IN NEBU: 10.0000 mg/h | INHALATION_SOLUTION | Freq: Once | RESPIRATORY_TRACT | Status: AC

## 2022-09-17 MED ORDER — PREDNISONE 20 MG PO TABS
40.0000 mg | ORAL_TABLET | Freq: Every day | ORAL | Status: DC
  Administered 2022-09-17 – 2022-09-19 (×3): 40 mg via ORAL
  Filled 2022-09-17 (×3): qty 2

## 2022-09-17 MED ORDER — LORAZEPAM 2 MG/ML IJ SOLN
0.5000 mg | Freq: Once | INTRAMUSCULAR | Status: AC
  Administered 2022-09-17: 0.5 mg via INTRAVENOUS
  Filled 2022-09-17: qty 1

## 2022-09-17 MED ORDER — SODIUM CHLORIDE 0.9 % IV BOLUS
1000.0000 mL | Freq: Once | INTRAVENOUS | Status: AC
  Administered 2022-09-17: 1000 mL via INTRAVENOUS

## 2022-09-17 MED ORDER — IPRATROPIUM-ALBUTEROL 0.5-2.5 (3) MG/3ML IN SOLN
3.0000 mL | Freq: Four times a day (QID) | RESPIRATORY_TRACT | Status: DC
  Administered 2022-09-17 – 2022-09-18 (×4): 3 mL via RESPIRATORY_TRACT
  Filled 2022-09-17 (×4): qty 3

## 2022-09-17 MED ORDER — ATORVASTATIN CALCIUM 10 MG PO TABS
20.0000 mg | ORAL_TABLET | Freq: Every evening | ORAL | Status: DC
  Administered 2022-09-17 – 2022-09-18 (×2): 20 mg via ORAL
  Filled 2022-09-17 (×2): qty 2

## 2022-09-17 MED ORDER — ONDANSETRON HCL 4 MG/2ML IJ SOLN: 4.0000 mg | Freq: Four times a day (QID) | INTRAMUSCULAR | Status: DC | PRN

## 2022-09-17 MED ORDER — ASPIRIN 81 MG PO TBEC
81.0000 mg | DELAYED_RELEASE_TABLET | Freq: Every day | ORAL | Status: DC
  Administered 2022-09-17 – 2022-09-19 (×3): 81 mg via ORAL
  Filled 2022-09-17 (×3): qty 1

## 2022-09-17 MED ORDER — ACETAMINOPHEN 325 MG PO TABS: 650.0000 mg | ORAL_TABLET | Freq: Four times a day (QID) | ORAL | Status: DC | PRN

## 2022-09-17 MED ORDER — ALBUTEROL SULFATE (2.5 MG/3ML) 0.083% IN NEBU
INHALATION_SOLUTION | RESPIRATORY_TRACT | Status: AC
  Administered 2022-09-17: 10 mg/h via RESPIRATORY_TRACT
  Filled 2022-09-17: qty 3

## 2022-09-17 MED ORDER — EZETIMIBE 10 MG PO TABS
10.0000 mg | ORAL_TABLET | Freq: Every day | ORAL | Status: DC
  Administered 2022-09-17 – 2022-09-19 (×3): 10 mg via ORAL
  Filled 2022-09-17 (×3): qty 1

## 2022-09-17 NOTE — Progress Notes (Signed)
Post ABG RT placed pt on 3L nasal cannula and pt is tolerating well at this time. RT to continue to monitor as needed.

## 2022-09-17 NOTE — ED Triage Notes (Signed)
Patient bib [facility] from home in respiratory distress. Patient has had shortness of breath for a week which suddenly worsened around 5pm last night. On arrival EMS stated that all lung sounds were diminished. EMS gave 2 duonebs, 2 grams of mag, and 125 of solumedrol enroute to ED. Patients lung sounds on arrival to ED are wheezing in all fields. [facility] reports VSS.

## 2022-09-17 NOTE — Evaluation (Signed)
Physical Therapy Evaluation Patient Details Name: [name] Kim MRN: [id number] DOB: 30-Mar-1951 Today's Date: 09/17/2022  History of Present Illness  72 y.o. male who presents 09/17/22 with complaints of shortness of breath, tachycardia, and requiring BiPAP; CTA negative for PE   PMH- significant of hyperlipidemia, CAD, HIV, COPD, malignant lung nodule s/p resection, CKD, seizure disorder, AAA, and tobacco abuse  Clinical Impression   Pt admitted secondary to problem above with deficits below. PTA patient was independent with ambulation and even walked 1.25 miles 2 days PTA. Pt currently requires 4L of O2 to maintain sats 86-90% with minimal activity (sit EOB, stand EOB). Unable to progress to ambulation due to low sats.  Anticipate patient will benefit from PT to address problems listed below.Will continue to follow acutely to maximize functional mobility independence and safety.          Recommendations for follow up therapy are one component of a multi-disciplinary discharge planning process, led by the attending physician.  Recommendations may be updated based on patient status, additional functional criteria and insurance authorization.  Follow Up Recommendations       Assistance Recommended at Discharge Intermittent Supervision/Assistance  Patient can return home with the following  Assistance with cooking/housework;Help with stairs or ramp for entrance    Equipment Recommendations None recommended by PT  Recommendations for Other Services  OT consult    Functional Status Assessment Patient has had a recent decline in their functional status and demonstrates the ability to make significant improvements in function in a reasonable and predictable amount of time.     Precautions / Restrictions Precautions Precautions: Other (comment) Precaution Comments: watch sats      Mobility  Bed Mobility Overal bed mobility: Independent                  Transfers Overall  transfer level: Needs assistance Equipment used: None Transfers: Sit to/from Stand Sit to Stand: Min guard           General transfer comment: no signs of imbalance; denied dizziness    Ambulation/Gait         Gait velocity: unable to tolerate (drop in sats with standing)        Stairs            Wheelchair Mobility    Modified Rankin (Stroke Patients Only)       Balance Overall balance assessment: Independent                                           Pertinent Vitals/Pain Pain Assessment Pain Assessment: No/denies pain    Home Living Family/patient expects to be discharged to:: Private residence Living Arrangements: Spouse/significant other Available Help at Discharge: Family;Available 24 hours/day Type of Home: House Home Access: Level entry       Home Layout: Two level;Full bath on main level;Able to live on main level with bedroom/bathroom Home Equipment: Gilmer Mor - single point      Prior Function Prior Level of Function : Independent/Modified Independent             Mobility Comments: no device; cane was given to him and has never used       Hand Dominance        Extremity/Trunk Assessment   Upper Extremity Assessment Upper Extremity Assessment: Defer to OT evaluation    Lower Extremity Assessment Lower Extremity Assessment: Overall  WFL for tasks assessed    Cervical / Trunk Assessment Cervical / Trunk Assessment: Normal  Communication   Communication: No difficulties  Cognition Arousal/Alertness: Awake/alert Behavior During Therapy: WFL for tasks assessed/performed Overall Cognitive Status: Within Functional Limits for tasks assessed                                          General Comments General comments (skin integrity, edema, etc.): On 4L O2 with supine sats 93%; upright sitting and talking as low as 86% with slow recovery to 90% with pursed lip breathing; standing 86%     Exercises     Assessment/Plan    PT Assessment Patient needs continued PT services  PT Problem List Decreased activity tolerance;Decreased mobility;Cardiopulmonary status limiting activity       PT Treatment Interventions Gait training;Functional mobility training;Therapeutic activities;Therapeutic exercise;Patient/family education    PT Goals (Current goals can be found in the Care Plan section)  Acute Rehab PT Goals Patient Stated Goal: return to walking 1+ miles as he did recently PT Goal Formulation: With patient Time For Goal Achievement: 10/01/22 Potential to Achieve Goals: Good    Frequency Min 3X/week     Co-evaluation               AM-PAC PT "6 Clicks" Mobility  Outcome Measure Help needed turning from your back to your side while in a flat bed without using bedrails?: None Help needed moving from lying on your back to sitting on the side of a flat bed without using bedrails?: None Help needed moving to and from a bed to a chair (including a wheelchair)?: None Help needed standing up from a chair using your arms (e.g., wheelchair or bedside chair)?: None Help needed to walk in hospital room?: Total Help needed climbing 3-5 steps with a railing? : Total 6 Click Score: 18    End of Session Equipment Utilized During Treatment: Oxygen Activity Tolerance: Treatment limited secondary to medical complications (Comment) (dropping oxygen saturation) Patient left: in bed;with call bell/phone within reach;with bed alarm set Nurse Communication: Mobility status;Other (comment) (drop in sats with minimal activity) PT Visit Diagnosis: Difficulty in walking, not elsewhere classified (R26.2)    Time: 1610-9604 PT Time Calculation (min) (ACUTE ONLY): 22 min   Charges:   PT Evaluation $PT Eval Low Complexity: 1 Low           Jerolyn Center, PT Acute Rehabilitation Services  Office [phone]   Zena Amos 09/17/2022, 2:41 PM

## 2022-09-17 NOTE — H&P (Signed)
History and Physical    Patient: [name] Kim ZOX:[id number] DOB: 21-Oct-1950 DOA: 09/17/2022 DOS: the patient was seen and examined on 09/17/2022 PCP: Tracey Harries, MD  Patient coming from: Home via EMS  Chief Complaint:  Chief Complaint  Patient presents with   Respiratory Distress   HPI: [name] Kim is a 72 y.o. male with medical history significant of hyperlipidemia, CAD, HIV, COPD, malignant lung nodule s/p resection, seizure disorder, AAA, and tobacco abuse who presents with complaints of shortness of breath started yesterday morning.  Normally his breathing is well-controlled and he is not on oxygen at baseline.  He states that about 3 weeks ago [employer] stopped covering his Anoro Ellipta inhaler and replaced it with Bevespi.  Bevespi did not control his breathing as well as the other medication did and therefore he had notified his pulmonologist and they were able to get the prior authorization for the Anoro which that he had just picked up the other day.  He is not in [place] things and he has seasonal allergies at baseline.  He reports having increased difficulty breathing for which his partner states he was unable to walk even 10 steps without becoming extremely out of breath.  He had associated symptoms of sweats and wheezing.  Denied having any significant fever or cough.  He just recently quit smoking.  Route with EMS patient has been given 2 DuoNebs, magnesium sulfate 2 g IV, Solu-Medrol 125 mg IV   In the emergency department patient was noted to be afebrile with tachycardia, tachypnea, and O2 saturations initially maintained on BiPAP.  Venous blood gas noted pH to be 7.212 with pCO2 73.9. patient was able to be weaned down to 3 L nasal cannula oxygen with O2 saturations above 90% fter arterial blood gas noted normalized pH with pCO2 41.8.  Labs significant for WBC 14.3.  Chest x-ray noted hyperinflation without active  disease.  COVID-19 screening was negative CT angiogram had been obtained which noted no pulmonary embolism, bronchitis, and emphysema with airway impactions in the lower lung fields.  Patient has been given 1 L normal saline IV fluids and multiple breathing treatments.  Review of Systems: As mentioned in the history of present illness. All other systems reviewed and are negative. 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   Past Surgical History:  Procedure Laterality Date   APPENDECTOMY  1970   BRONCHIAL BIOPSY  05/31/2021   Procedure: BRONCHIAL BIOPSIES;  Surgeon: Leslye Peer, MD;  Location: [facility] ENDOSCOPY;  Service: Pulmonary;;   BRONCHIAL BRUSHINGS  05/31/2021   Procedure: BRONCHIAL BRUSHINGS;  Surgeon: Leslye Peer, MD;  Location: [facility] ENDOSCOPY;  Service: Pulmonary;;   BRONCHIAL NEEDLE ASPIRATION BIOPSY  05/31/2021   Procedure: BRONCHIAL NEEDLE ASPIRATION BIOPSIES;  Surgeon: Leslye Peer, MD;  Location[facility] ENDOSCOPY;  Service: Pulmonary;;   CATARACT EXTRACTION Bilateral    CATARACT EXTRACTION W/PHACO Right 02/03/2020  Procedure: CATARACT EXTRACTION PHACO AND INTRAOCULAR LENS PLACEMENT (IOC) RIGHT 4.30 00:28.6;  Surgeon: Galen Manila, MD;  Location: [facility];  Service: Ophthalmology;  Laterality: Right;   CATARACT EXTRACTION W/PHACO Left 02/24/2020   Procedure: CATARACT EXTRACTION PHACO AND INTRAOCULAR LENS PLACEMENT (IOC) LEFT 5.58 00:32.2;  Surgeon: Galen Manila, MD;  Location: [facility];  Service: Ophthalmology;  Laterality: Left;    CHOLECYSTECTOMY     COLONOSCOPY     CORONARY ARTERY BYPASS GRAFT  08/09/2005   LIMA-LAD, SVG-diagonal, SVG-OM   FIDUCIAL MARKER PLACEMENT  05/31/2021   Procedure: FIDUCIAL MARKER PLACEMENT;  Surgeon: Leslye Peer, MD;  Location: [facility] ENDOSCOPY;  Service: Pulmonary;;   TONSILECTOMY, ADENOIDECTOMY, BILATERAL MYRINGOTOMY AND TUBES  08/1964   VIDEO BRONCHOSCOPY WITH RADIAL ENDOBRONCHIAL ULTRASOUND  05/31/2021   Procedure: VIDEO BRONCHOSCOPY WITH RADIAL ENDOBRONCHIAL ULTRASOUND;  Surgeon: Leslye Peer, MD;  Location: [facility] ENDOSCOPY;  Service: Pulmonary;;   Social History:  reports that he quit smoking about 17 years ago. His smoking use included cigarettes. He smoked an average of .5 packs per day. He has never used smokeless tobacco. He reports current alcohol use. He reports that he does not use drugs.  Allergies  Allergen Reactions   Zidovudine Rash and Other (See Comments)    Caused bone marrow problems  2000     Family History  Problem Relation Age of Onset   Cancer Mother    Cancer Father        leukemia    Prior to Admission medications   Medication Sig Start Date End Date Taking? Authorizing Provider  ANORO ELLIPTA 62.5-25 MCG/ACT AEPB Inhale 1 puff into the lungs daily. 09/16/22  Yes [provider]  aspirin 81 MG tablet Take 81 mg by mouth daily.   Yes [provider]  atorvastatin (LIPITOR) 20 MG tablet TAKE 1 TABLET(20 MG) BY MOUTH DAILY AT 6 PM 09/12/22  Yes Gollan, Tollie Pizza, MD  bictegravir-emtricitabine-tenofovir AF (BIKTARVY) 50-200-25 MG TABS tablet Take 1 tablet by mouth daily. 04/05/22  Yes Cliffton Asters, MD  ezetimibe (ZETIA) 10 MG tablet TAKE 1 TABLET(10 MG) BY MOUTH DAILY 08/29/22  Yes Gollan, Tollie Pizza, MD  folic acid (FOLVITE) 400 MCG tablet Take 400 mcg by mouth in the morning.   Yes [provider]  lamoTRIgine (LAMICTAL) 100 MG tablet Take 100 mg by mouth 2 (two) times daily.   Yes [provider]  omega-3 acid ethyl esters  (LOVAZA) 1 g capsule TAKE 1 CAPSULE BY MOUTH TWICE DAILY 08/15/22  Yes Gollan, Tollie Pizza, MD  acetaminophen (TYLENOL) 500 MG tablet Take 250 mg by mouth every 6 (six) hours as needed (pain).    [provider]  albuterol (VENTOLIN HFA) 108 (90 Base) MCG/ACT inhaler INHALE 1 PUFF INTO THE LUNGS EVERY 6 HOURS AS NEEDED FOR WHEEZING OR SHORTNESS OF BREATH 04/03/22   Leslye Peer, MD    Physical Exam: Vitals:   09/17/22 0820 09/17/22 0856 09/17/22 0900 09/17/22 0901  BP:   134/69   Pulse:   68 72  Resp:   (!) 21 18  Temp:  97.6 F (36.4 C)    TempSrc:  Oral    SpO2: 92%  90% 92%  Weight:      Height:       Constitutional: Elderly male who appears to be in no acute distress at this time and able to follow commands Eyes: PERRL, lids and conjunctivae normal ENMT: Mucous membranes are moist. Neck: normal, supple, no JVD  appreciated. Respiratory:'s overall aeration with expiratory wheezes appreciated in both lung fields.  Patient on 3 L nasal cannula oxygen and able to talk in complete sentences. Cardiovascular: Regular rate and rhythm, no murmurs / rubs / gallops. No extremity edema. 2+ pedal pulses.  Healed midline scar from CABG. Abdomen: no tenderness, no masses palpated.  Bowel sounds positive.  Musculoskeletal: no clubbing / cyanosis. No joint deformity upper and lower extremities. Good ROM, no contractures. Normal muscle tone.  Skin: no rashes, lesions, ulcers.   Neurologic: CN 2-12 grossly intact.  Strength 5/5 in all 4.  Psychiatric: Normal judgment and insight. Alert and oriented x 3. Normal mood.   Data Reviewed:  EKG reveals sinus rhythm at 90 bpm.  Reviewed labs, imaging, and pertinent records as noted above in HPI.  Assessment and Plan: Acute respiratory failure with hypoxia and hypercapnia COPD exacerbation Patient presents with progressively worsening shortness of breath and wheezing.  Patient is not on oxygen at baseline.  Venous blood gas significant pH- 7.212  and pCO2- 73.9.  He was placed on BiPAP until repeat ABG approximately 8 hours later had noted pH- 7.357, pCO2- 41.8, PO2-72.  CT angiogram of the chest have been obtained which noted no PE, bronchitis, and emphysema with airway impactions in the lower lung fields.  Patient had received breathing treatments, magnesium sulfate, and Solu-Medrol 125 mg and route with EMS.  Patient is followed by Dr. Delton Coombes of pulmonology in outpatient setting. -Admit to a telemetry bed -Continuous pulse oximetry with oxygen maintain O2 saturation greater than 90% -BiPAP as needed -Incentive spirometry and flutter valve -Check pulse oximetry with ambulation -Check procalcitonin and consider broadening antibiotics -Schedule DuoNeb breathing treatments and albuterol nebs as needed -Azithromycin -Prednisone 40 mg daily -Resume Anoro inhaler when med the appropriate -Mucinex -PT consulted to evaluate  Leukocytosis Acute.  White blood cell count elevated 14.3.  Patient had received IV steroid prior to arrival. -Recheck CBC tomorrow morning  Chronic kidney disease stage IIIa Creatinine 1.35 which appears around patient's baseline. -Continue to monitor kidney function  HIV Labs note patient was undetectable when last checked on 03/22/2022.   -Continue Biktarvy -Continue outpatient follow-up with Dr. 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  Declined need of nicotine patch. -Continue to counsel on need of cessation of tobacco use  AAA AAA noted to be 3.2 cm in March 2024 -Continue outpatient monitoring  DVT prophylaxis: Lovenox Advance Care Planning:   Code Status: Full Code     Consults: None  Family Communication: Patient partner updated at bedside  Severity of Illness: The appropriate patient status for this patient is INPATIENT. Inpatient status is judged to be reasonable and necessary in order to provide the required intensity of service to ensure the patient's safety. The patient's presenting symptoms, physical exam findings, and initial radiographic and laboratory data in the context of their chronic comorbidities is felt to place them at high risk for further clinical deterioration. Furthermore, it is not anticipated that the patient will be medically stable for discharge from the hospital within 2 midnights of admission.   * I certify that at the point of admission it is my clinical judgment that the patient will require inpatient hospital care  spanning beyond 2 midnights from the point of admission due to high intensity of service, high risk for further deterioration and high frequency of surveillance required.*  Author: Clydie Braun, MD 09/17/2022 9:30 AM  For on call review [email or web address].

## 2022-09-17 NOTE — ED Notes (Signed)
ED TO INPATIENT HANDOFF REPORT  ED Nurse Name and Phone #: [phone]  S Name/Age/Gender [name] Kim 72 y.o. male Room/Bed: 032C/032C  Code Status   Code Status: Full Code  Home/SNF/Other Home Patient oriented to: self, place, time, and situation Is this baseline? Yes   Triage Complete: Triage complete  Chief Complaint COPD with acute exacerbation (HCC) [J44.1] COPD exacerbation (HCC) [J44.1]  Triage Note Patient bib [facility] from home in respiratory distress. Patient has had shortness of breath for a week which suddenly worsened around 5pm last night. On arrival EMS stated that all lung sounds were diminished. EMS gave 2 duonebs, 2 grams of mag, and 125 of solumedrol enroute to ED. Patients lung sounds on arrival to ED are wheezing in all fields. [facility] reports VSS.    Allergies Allergies  Allergen Reactions   Zidovudine Rash and Other (See Comments)    Caused bone marrow problems  2000     Level of Care/Admitting Diagnosis ED Disposition     ED Disposition  Admit   Condition  --   Comment  Hospital Area: [facility] [[id number]]  Level of Care: Telemetry Medical [[id number]]  May admit patient to [facility] or [facility] if equivalent level of care is available:: No  Covid Evaluation: Asymptomatic - no recent exposure (last 10 days) testing not required  Diagnosis: COPD exacerbation [facility]) [[id number]]  Admitting Physician: Clydie Braun [[id number]]  Attending Physician: Clydie Braun [[id number]]  Certification:: I certify this patient will need inpatient services for at least 2 midnights  Estimated Length of Stay: 2          B Medical/Surgery History Past Medical History:  Diagnosis Date   AAA (abdominal aortic aneurysm) (HCC)    Anemia    CAD (coronary artery disease)    Chronic kidney disease    COPD (chronic obstructive pulmonary disease) (HCC)    GERD (gastroesophageal reflux disease)    History of blood transfusion    x 3   HIV infection  (HCC)    Hypercholesteremia    Hyperlipidemia    Seasonal allergies    Seizures (HCC)    last one Oct 2003 petit mal   Sleep behavior disorder, 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   Past Surgical History:  Procedure Laterality Date   APPENDECTOMY  1970   BRONCHIAL BIOPSY  05/31/2021   Procedure: BRONCHIAL BIOPSIES;  Surgeon: Leslye Peer, MD;  Location: [facility];  Service: Pulmonary;;   BRONCHIAL BRUSHINGS  05/31/2021   Procedure: BRONCHIAL BRUSHINGS;  Surgeon: Leslye Peer, MD;  Location: [facility];  Service: Pulmonary;;   BRONCHIAL NEEDLE ASPIRATION BIOPSY  05/31/2021   Procedure: BRONCHIAL NEEDLE ASPIRATION BIOPSIES;  Surgeon: Leslye Peer, MD;  Location: [facility];  Service: Pulmonary;;   CATARACT EXTRACTION Bilateral    CATARACT EXTRACTION W/PHACO Right 02/03/2020   Procedure: CATARACT EXTRACTION PHACO AND INTRAOCULAR LENS PLACEMENT (IOC) RIGHT 4.30 00:28.6;  Surgeon: Galen Manila, MD;  Location: [facility];  Service: Ophthalmology;  Laterality: Right;   CATARACT EXTRACTION W/PHACO Left 02/24/2020   Procedure: CATARACT EXTRACTION PHACO AND INTRAOCULAR LENS PLACEMENT (IOC) LEFT 5.58 00:32.2;  Surgeon: Galen Manila, MD;  Location: [facility];  Service: Ophthalmology;  Laterality: Left;   CHOLECYSTECTOMY     COLONOSCOPY     CORONARY ARTERY BYPASS GRAFT  08/09/2005   LIMA-LAD, SVG-diagonal, SVG-OM   FIDUCIAL MARKER PLACEMENT  05/31/2021   Procedure: FIDUCIAL MARKER PLACEMENT;  Surgeon: Leslye Peer, MD;  Location: [facility];  Service: Pulmonary;;   TONSILECTOMY, ADENOIDECTOMY, BILATERAL MYRINGOTOMY AND TUBES  08/1964   VIDEO BRONCHOSCOPY WITH RADIAL ENDOBRONCHIAL ULTRASOUND  05/31/2021   Procedure: VIDEO BRONCHOSCOPY WITH RADIAL ENDOBRONCHIAL ULTRASOUND;  Surgeon: Leslye Peer, MD;  Location: [facility];  Service: Pulmonary;;     A IV Location/Drains/Wounds Patient Lines/Drains/Airways Status     Active Line/Drains/Airways     Name Placement  date Placement time Site Days   Peripheral IV 09/17/22 18 G Anterior;Distal;Left;Upper Arm 09/17/22  --  Arm  less than 1   Peripheral IV 09/17/22 18 G 1.16" Posterior;Right Forearm 09/17/22  0044  Forearm  less than 1            Intake/Output Last 24 hours  Intake/Output Summary (Last 24 hours) at 09/17/2022 0950 Last data filed at 09/17/2022 1610 Gross per 24 hour  Intake 1000 ml  Output --  Net 1000 ml    Labs/Imaging Results for orders placed or performed during the hospital encounter of 09/17/22 (from the past 48 hour(s))  Comprehensive metabolic panel     Status: Abnormal   Collection Time: 09/17/22 12:09 AM  Result Value Ref Range   Sodium 137 135 - 145 mmol/L   Potassium 4.5 3.5 - 5.1 mmol/L   Chloride 102 98 - 111 mmol/L   CO2 27 22 - 32 mmol/L   Glucose, Bld 153 (H) 70 - 99 mg/dL    Comment: Glucose reference range applies only to samples taken after fasting for at least 8 hours.   BUN 12 8 - 23 mg/dL   Creatinine, Ser 9.60 (H) 0.61 - 1.24 mg/dL   Calcium 9.5 8.9 - 45.4 mg/dL   Total Protein 7.6 6.5 - 8.1 g/dL   Albumin 4.4 3.5 - 5.0 g/dL   AST 23 15 - 41 U/L   ALT 20 0 - 44 U/L   Alkaline Phosphatase 64 38 - 126 U/L   Total Bilirubin 1.2 0.3 - 1.2 mg/dL   GFR, Estimated 56 (L) >60 mL/min    Comment: (NOTE) Calculated using the CKD-EPI Creatinine Equation (2021)    Anion gap 8 5 - 15    Comment: Performed at [facility], [address] [place], [place] [address]  CBC with Differential     Status: Abnormal   Collection Time: 09/17/22 12:09 AM  Result Value Ref Range   WBC 14.3 (H) 4.0 - 10.5 K/uL   RBC 4.74 4.22 - 5.81 MIL/uL   Hemoglobin 16.5 13.0 - 17.0 g/dL   HCT 91.4 78.2 - 95.6 %   MCV 103.4 (H) 80.0 - 100.0 fL   MCH 34.8 (H) 26.0 - 34.0 pg   MCHC 33.7 30.0 - 36.0 g/dL   RDW 21.3 08.6 - 57.8 %   Platelets 217 150 - 400 K/uL   nRBC 0.0 0.0 - 0.2 %   Neutrophils Relative % 55 %   Neutro Abs 7.9 (H) 1.7 - 7.7 K/uL   Lymphocytes Relative 28  %   Lymphs Abs 3.9 0.7 - 4.0 K/uL   Monocytes Relative 12 %   Monocytes Absolute 1.7 (H) 0.1 - 1.0 K/uL   Eosinophils Relative 4 %   Eosinophils Absolute 0.6 (H) 0.0 - 0.5 K/uL   Basophils Relative 1 %   Basophils Absolute 0.1 0.0 - 0.1 K/uL   Immature Granulocytes 0 %   Abs Immature Granulocytes 0.04 0.00 - 0.07 K/uL    Comment: Performed at [facility], [address], [place], [place] [address]  Troponin I (High Sensitivity)     Status: None   Collection Time: 09/17/22 12:09 AM  Result Value Ref Range   Troponin I (High Sensitivity) 6 <18 ng/L    Comment: (NOTE) Elevated high sensitivity troponin I (hsTnI) values and significant  changes across serial measurements may suggest ACS but many other  chronic and acute conditions are known to elevate hsTnI results.  Refer to the "Links" section for chest pain algorithms and additional  guidance. Performed at [facility], [address], [place], [place] [address]   Brain natriuretic peptide     Status: None   Collection Time: 09/17/22 12:09 AM  Result Value Ref Range   B Natriuretic Peptide 31.1 0.0 - 100.0 pg/mL    Comment: Performed at [facility], [address], [place], [place] [address]  Lactic acid, plasma     Status: None   Collection Time: 09/17/22 12:09 AM  Result Value Ref Range   Lactic Acid, Venous 1.2 0.5 - 1.9 mmol/L    Comment: Performed at [facility], [address], [place], [place] [address]  SARS Coronavirus 2 by RT PCR (hospital order, performed in [facility] hospital lab) *cepheid single result test* Anterior Nasal Swab     Status: Abnormal   Collection Time: 09/17/22 12:12 AM   Specimen: Anterior Nasal Swab  Result Value Ref Range   SARS Coronavirus 2 by RT PCR NOT DETECTED (A) NEGATIVE    Comment: Performed at [facility], [address], [place], [place] [address]  I-stat chem 8, ED     Status: Abnormal   Collection Time: 09/17/22 12:25 AM  Result Value Ref Range    Sodium 138 135 - 145 mmol/L   Potassium 4.7 3.5 - 5.1 mmol/L   Chloride 103 98 - 111 mmol/L   BUN 13 8 - 23 mg/dL   Creatinine, Ser 2.95 (H) 0.61 - 1.24 mg/dL   Glucose, Bld 621 (H) 70 - 99 mg/dL    Comment: Glucose reference range applies only to samples taken after fasting for at least 8 hours.   Calcium, Ion 1.30 1.15 - 1.40 mmol/L   TCO2 28 22 - 32 mmol/L   Hemoglobin 17.3 (H) 13.0 - 17.0 g/dL   HCT 30.8 65.7 - 84.6 %  I-Stat venous blood gas, ([facility] ED, [facility], [facility])     Status: Abnormal   Collection Time: 09/17/22 12:28 AM  Result Value Ref Range   pH, Ven 7.212 (L) 7.25 - 7.43   pCO2, Ven 73.9 (HH) 44 - 60 mmHg   pO2, Ven 68 (H) 32 - 45 mmHg   Bicarbonate 29.7 (H) 20.0 - 28.0 mmol/L   TCO2 32 22 - 32 mmol/L   O2 Saturation 88 %   Acid-base deficit 1.0 0.0 - 2.0 mmol/L   Sodium 138 135 - 145 mmol/L   Potassium 4.8 3.5 - 5.1 mmol/L   Calcium, Ion 1.29 1.15 - 1.40 mmol/L   HCT 50.0 39.0 - 52.0 %   Hemoglobin 17.0 13.0 - 17.0 g/dL   Sample type VENOUS    Comment NOTIFIED PHYSICIAN   Lactic acid, plasma     Status: None   Collection Time: 09/17/22  2:18 AM  Result Value Ref Range   Lactic Acid, Venous 1.8 0.5 - 1.9 mmol/L    Comment: Performed at [facility], [address], [place], [place] [address]  Troponin I (High Sensitivity)     Status: None  Collection Time: 09/17/22  2:18 AM  Result Value Ref Range   Troponin I (High Sensitivity) 8 <18 ng/L    Comment: (NOTE) Elevated high sensitivity troponin I (hsTnI) values and significant  changes across serial measurements may suggest ACS but many other  chronic and acute conditions are known to elevate hsTnI results.  Refer to the "Links" section for chest pain algorithms and additional  guidance. Performed at [facility], [address], [place], [place] [address]   I-Stat venous blood gas, [facility] ED, [facility], [facility])     Status: Abnormal   Collection Time: 09/17/22  4:52 AM  Result Value Ref Range   pH, Ven 7.199 (LL)  7.25 - 7.43   pCO2, Ven 67.2 (H) 44 - 60 mmHg   pO2, Ven 50 (H) 32 - 45 mmHg   Bicarbonate 26.2 20.0 - 28.0 mmol/L   TCO2 28 22 - 32 mmol/L   O2 Saturation 74 %   Acid-base deficit 4.0 (H) 0.0 - 2.0 mmol/L   Sodium 136 135 - 145 mmol/L   Potassium 4.4 3.5 - 5.1 mmol/L   Calcium, Ion 1.20 1.15 - 1.40 mmol/L   HCT 44.0 39.0 - 52.0 %   Hemoglobin 15.0 13.0 - 17.0 g/dL   Sample type VENOUS    Comment NOTIFIED PHYSICIAN   I-Stat arterial blood gas, ED     Status: Abnormal   Collection Time: 09/17/22  8:15 AM  Result Value Ref Range   pH, Arterial 7.357 7.35 - 7.45   pCO2 arterial 41.8 32 - 48 mmHg   pO2, Arterial 72 (L) 83 - 108 mmHg   Bicarbonate 23.4 20.0 - 28.0 mmol/L   TCO2 25 22 - 32 mmol/L   O2 Saturation 94 %   Acid-base deficit 2.0 0.0 - 2.0 mmol/L   Sodium 136 135 - 145 mmol/L   Potassium 4.4 3.5 - 5.1 mmol/L   Calcium, Ion 1.28 1.15 - 1.40 mmol/L   HCT 43.0 39.0 - 52.0 %   Hemoglobin 14.6 13.0 - 17.0 g/dL   Patient temperature 60.4 F    Collection site RADIAL, ALLEN'S TEST ACCEPTABLE    Drawn by RT    Sample type ARTERIAL    CT Angio Chest PE W and/or Wo Contrast  Result Date: 09/17/2022 CLINICAL DATA:  Pulmonary embolism suspected EXAM: CT ANGIOGRAPHY CHEST WITH CONTRAST TECHNIQUE: Multidetector CT imaging of the chest was performed using the standard protocol during bolus administration of intravenous contrast. Multiplanar CT image reconstructions and MIPs were obtained to evaluate the vascular anatomy. RADIATION DOSE REDUCTION: This exam was performed according to the departmental dose-optimization program which includes automated exposure control, adjustment of the mA and/or kV according to patient size and/or use of iterative reconstruction technique. CONTRAST:  75mL OMNIPAQUE IOHEXOL 350 MG/ML SOLN COMPARISON:  06/12/2022 FINDINGS: Cardiovascular: Satisfactory opacification of the pulmonary arteries to the segmental level. No evidence of pulmonary embolism. Normal heart  size. No pericardial effusion. Atherosclerosis with CABG. Mediastinum/Nodes: Negative for mass or adenopathy. Lungs/Pleura: Areas of airway impaction in the lower lobes. Generalized airway thickening. Centrilobular emphysema at the apices. There is no edema, consolidation, effusion, or pneumothorax. Postoperative left lung base for nodule resection. Upper Abdomen: Cholecystectomy.  No acute finding Musculoskeletal: No acute finding. Review of the MIP images confirms the above findings. IMPRESSION: 1. Negative for pulmonary embolism. 2. Bronchitis and emphysema with airway impactions in the lower lobes. Electronically Signed   By: Tiburcio Pea M.D.   On: 09/17/2022 05:10  DG Chest Portable 1 View  Result Date: 09/17/2022 CLINICAL DATA:  Shortness of breath. EXAM: PORTABLE CHEST 1 VIEW COMPARISON:  05/31/2021. FINDINGS: The heart size and mediastinal contours are within normal limits. There is atherosclerotic calcification of the aorta. Hyperinflation of the lungs is noted. No consolidation, effusion, or pneumothorax. Sternotomy wires are present over the midline. IMPRESSION: Hyperinflation of the lungs with no active disease. Electronically Signed   By: Thornell Sartorius M.D.   On: 09/17/2022 00:36    Pending Labs Unresulted Labs (From admission, onward)     Start     Ordered   09/18/22 0500  CBC  Tomorrow morning,   R        09/17/22 0942   09/18/22 0500  Basic metabolic panel  Tomorrow morning,   R        09/17/22 0942   09/17/22 0944  Procalcitonin  Once,   R       References:    Procalcitonin Lower Respiratory Tract Infection AND Sepsis Procalcitonin Algorithm   09/17/22 0943   09/17/22 0942  Respiratory (~20 pathogens) panel by PCR  (Respiratory panel by PCR (~20 pathogens, ~24 hr TAT)  w precautions)  Once,   R        09/17/22 0942   09/17/22 0804  Blood gas, arterial  Once,   R        09/17/22 0803            Vitals/Pain Today's Vitals   09/17/22 0856 09/17/22 0900 09/17/22 0901  09/17/22 0930  BP:  134/69  106/63  Pulse:  68 72 64  Resp:  (!) 21 18 14   Temp: 97.6 F (36.4 C)     TempSrc: Oral     SpO2:  90% 92% 92%  Weight:      Height:      PainSc:        Isolation Precautions Droplet precaution  Medications Medications  ipratropium-albuterol (DUONEB) 0.5-2.5 (3) MG/3ML nebulizer solution 3 mL (3 mLs Nebulization Given 09/17/22 0747)  bictegravir-emtricitabine-tenofovir AF (BIKTARVY) 50-200-25 MG per tablet 1 tablet (has no administration in time range)  atorvastatin (LIPITOR) tablet 20 mg (has no administration in time range)  ezetimibe (ZETIA) tablet 10 mg (has no administration in time range)  folic acid (FOLVITE) tablet 400 mcg (has no administration in time range)  lamoTRIgine (LAMICTAL) tablet 100 mg (has no administration in time range)  azithromycin (ZITHROMAX) 500 mg in sodium chloride 0.9 % 250 mL IVPB (has no administration in time range)    Followed by  azithromycin (ZITHROMAX) tablet 500 mg (has no administration in time range)  ipratropium-albuterol (DUONEB) 0.5-2.5 (3) MG/3ML nebulizer solution 3 mL (has no administration in time range)  enoxaparin (LOVENOX) injection 40 mg (has no administration in time range)  sodium chloride flush (NS) 0.9 % injection 3 mL (has no administration in time range)  acetaminophen (TYLENOL) tablet 650 mg (has no administration in time range)    Or  acetaminophen (TYLENOL) suppository 650 mg (has no administration in time range)  ondansetron (ZOFRAN) tablet 4 mg (has no administration in time range)    Or  ondansetron (ZOFRAN) injection 4 mg (has no administration in time range)  guaiFENesin (MUCINEX) 12 hr tablet 600 mg (has no administration in time range)  albuterol (PROVENTIL) (2.5 MG/3ML) 0.083% nebulizer solution 2.5 mg (has no administration in time range)  predniSONE (DELTASONE) tablet 40 mg (has no administration in time range)  aspirin tablet 81 mg (has no administration  in time range)   nitroGLYCERIN (NITROGLYN) 2 % ointment 1 inch (1 inch Topical Given 09/17/22 0013)  albuterol (PROVENTIL) (2.5 MG/3ML) 0.083% nebulizer solution (10 mg/hr Nebulization Given 09/17/22 0016)  LORazepam (ATIVAN) injection 0.5 mg (0.5 mg Intravenous Given 09/17/22 0100)  sodium chloride 0.9 % bolus 1,000 mL (0 mLs Intravenous Stopped 09/17/22 0613)  iohexol (OMNIPAQUE) 350 MG/ML injection 75 mL (75 mLs Intravenous Contrast Given 09/17/22 0429)    Mobility non-ambulatory     Focused Assessments    R Recommendations: See Admitting Provider Note  Report given to:   Additional Notes:

## 2022-09-17 NOTE — ED Provider Notes (Signed)
[place] EMERGENCY DEPARTMENT AT [facility] Provider Note   CSN: [id number] Arrival date & time: 09/17/22  0006     History  Chief Complaint  Patient presents with   Respiratory Distress    [name] Kim is a 72 y.o. male.  HPI   Patient with medical history including HIV, CAD, AAA, COPD, presenting with respiratory distress.  Patient states that he is felt short of breath over the last couple days describes as he cannot breathe, he had no associate cough congestion, no fevers no chills, he is understanding chest pain.  HPI is limited due to medical condition.    Home Medications Prior to Admission medications   Medication Sig Start Date End Date Taking? Authorizing Provider  ANORO ELLIPTA 62.5-25 MCG/ACT AEPB Inhale 1 puff into the lungs daily. 09/16/22  Yes [provider]  aspirin 81 MG tablet Take 81 mg by mouth daily.   Yes [provider]  atorvastatin (LIPITOR) 20 MG tablet TAKE 1 TABLET(20 MG) BY MOUTH DAILY AT 6 PM 09/12/22  Yes Gollan, Tollie Pizza, MD  bictegravir-emtricitabine-tenofovir AF (BIKTARVY) 50-200-25 MG TABS tablet Take 1 tablet by mouth daily. 04/05/22  Yes Cliffton Asters, MD  ezetimibe (ZETIA) 10 MG tablet TAKE 1 TABLET(10 MG) BY MOUTH DAILY 08/29/22  Yes Gollan, Tollie Pizza, MD  folic acid (FOLVITE) 400 MCG tablet Take 400 mcg by mouth in the morning.   Yes [provider]  lamoTRIgine (LAMICTAL) 100 MG tablet Take 100 mg by mouth 2 (two) times daily.   Yes [provider]  omega-3 acid ethyl esters (LOVAZA) 1 g capsule TAKE 1 CAPSULE BY MOUTH TWICE DAILY 08/15/22  Yes Gollan, Tollie Pizza, MD  acetaminophen (TYLENOL) 500 MG tablet Take 250 mg by mouth every 6 (six) hours as needed (pain).    [provider]  albuterol (VENTOLIN HFA) 108 (90 Base) MCG/ACT inhaler INHALE 1 PUFF INTO THE LUNGS EVERY 6 HOURS AS NEEDED FOR WHEEZING OR SHORTNESS OF BREATH 04/03/22   Leslye Peer, MD      Allergies     Zidovudine    Review of Systems   Review of Systems  Constitutional:  Negative for chills and fever.  Respiratory:  Positive for shortness of breath.   Cardiovascular:  Negative for chest pain.  Gastrointestinal:  Negative for abdominal pain.  Neurological:  Negative for headaches.    Physical Exam Updated Vital Signs BP 116/65   Pulse 68   Temp (!) 97.3 F (36.3 C) (Axillary)   Resp 17   Ht 5' 6.5" (1.689 m)   Wt 64 kg   SpO2 95%   BMI 22.43 kg/m  Physical Exam Vitals and nursing note reviewed.  Constitutional:      General: He is in acute distress.     Appearance: He is ill-appearing.  HENT:     Head: Normocephalic and atraumatic.     Nose: No congestion.     Mouth/Throat:     Mouth: Mucous membranes are dry.  Eyes:     Conjunctiva/sclera: Conjunctivae normal.  Neck:     Comments: No JVD present Cardiovascular:     Rate and Rhythm: Normal rate and regular rhythm.     Pulses: Normal pulses.     Heart sounds: No murmur heard.    No friction rub. No gallop.  Pulmonary:     Effort: No respiratory distress.     Breath sounds: No wheezing, rhonchi or rales.     Comments: On the  nonrebreather, tripoding, can only speak in short phrases, lung sounds were tight, with expiratory wheezing present, no notable rales or rhonchi Abdominal:     Palpations: Abdomen is soft.     Tenderness: There is no abdominal tenderness. There is no right CVA tenderness or left CVA tenderness.  Musculoskeletal:     Right lower leg: No edema.     Left lower leg: No edema.     Comments: No acute leg swelling no calf tenderness no palpable cords.  Skin:    General: Skin is warm and dry.  Neurological:     Mental Status: He is alert.  Psychiatric:        Mood and Affect: Mood normal.     ED Results / Procedures / Treatments   Labs (all labs ordered are listed, but only abnormal results are displayed) Labs Reviewed  SARS CORONAVIRUS 2 BY RT PCR - Abnormal; Notable for the following  components:      Result Value   SARS Coronavirus 2 by RT PCR NOT DETECTED (*)    All other components within normal limits  COMPREHENSIVE METABOLIC PANEL - Abnormal; Notable for the following components:   Glucose, Bld 153 (*)    Creatinine, Ser 1.35 (*)    GFR, Estimated 56 (*)    All other components within normal limits  CBC WITH DIFFERENTIAL/PLATELET - Abnormal; Notable for the following components:   WBC 14.3 (*)    MCV 103.4 (*)    MCH 34.8 (*)    Neutro Abs 7.9 (*)    Monocytes Absolute 1.7 (*)    Eosinophils Absolute 0.6 (*)    All other components within normal limits  I-STAT CHEM 8, ED - Abnormal; Notable for the following components:   Creatinine, Ser 1.30 (*)    Glucose, Bld 148 (*)    Hemoglobin 17.3 (*)    All other components within normal limits  I-STAT VENOUS BLOOD GAS, ED - Abnormal; Notable for the following components:   pH, Ven 7.212 (*)    pCO2, Ven 73.9 (*)    pO2, Ven 68 (*)    Bicarbonate 29.7 (*)    All other components within normal limits  I-STAT VENOUS BLOOD GAS, ED - Abnormal; Notable for the following components:   pH, Ven 7.199 (*)    pCO2, Ven 67.2 (*)    pO2, Ven 50 (*)    Acid-base deficit 4.0 (*)    All other components within normal limits  BRAIN NATRIURETIC PEPTIDE  LACTIC ACID, PLASMA  LACTIC ACID, PLASMA  TROPONIN I (HIGH SENSITIVITY)  TROPONIN I (HIGH SENSITIVITY)    EKG None  Radiology CT Angio Chest PE W and/or Wo Contrast  Result Date: 09/17/2022 CLINICAL DATA:  Pulmonary embolism suspected EXAM: CT ANGIOGRAPHY CHEST WITH CONTRAST TECHNIQUE: Multidetector CT imaging of the chest was performed using the standard protocol during bolus administration of intravenous contrast. Multiplanar CT image reconstructions and MIPs were obtained to evaluate the vascular anatomy. RADIATION DOSE REDUCTION: This exam was performed according to the departmental dose-optimization program which includes automated exposure control, adjustment of  the mA and/or kV according to patient size and/or use of iterative reconstruction technique. CONTRAST:  75mL OMNIPAQUE IOHEXOL 350 MG/ML SOLN COMPARISON:  06/12/2022 FINDINGS: Cardiovascular: Satisfactory opacification of the pulmonary arteries to the segmental level. No evidence of pulmonary embolism. Normal heart size. No pericardial effusion. Atherosclerosis with CABG. Mediastinum/Nodes: Negative for mass or adenopathy. Lungs/Pleura: Areas of airway impaction in the lower lobes. Generalized airway thickening. Centrilobular  emphysema at the apices. There is no edema, consolidation, effusion, or pneumothorax. Postoperative left lung base for nodule resection. Upper Abdomen: Cholecystectomy.  No acute finding Musculoskeletal: No acute finding. Review of the MIP images confirms the above findings. IMPRESSION: 1. Negative for pulmonary embolism. 2. Bronchitis and emphysema with airway impactions in the lower lobes. Electronically Signed   By: Tiburcio Pea M.D.   On: 09/17/2022 05:10   DG Chest Portable 1 View  Result Date: 09/17/2022 CLINICAL DATA:  Shortness of breath. EXAM: PORTABLE CHEST 1 VIEW COMPARISON:  05/31/2021. FINDINGS: The heart size and mediastinal contours are within normal limits. There is atherosclerotic calcification of the aorta. Hyperinflation of the lungs is noted. No consolidation, effusion, or pneumothorax. Sternotomy wires are present over the midline. IMPRESSION: Hyperinflation of the lungs with no active disease. Electronically Signed   By: Thornell Sartorius M.D.   On: 09/17/2022 00:36    Procedures .Critical Care  Performed by: Carroll Sage, PA-C Authorized by: Carroll Sage, PA-C   Critical care provider statement:    Critical care time (minutes):  60   Critical care was necessary to treat or prevent imminent or life-threatening deterioration of the following conditions:  Respiratory failure   Critical care was time spent personally by me on the following  activities:  Development of treatment plan with patient or surrogate, discussions with consultants, evaluation of patient's response to treatment, examination of patient, ordering and review of laboratory studies, ordering and review of radiographic studies, ordering and performing treatments and interventions, pulse oximetry, re-evaluation of patient's condition and review of old charts   I assumed direction of critical care for this patient from another provider in my specialty: no     Care discussed with: admitting provider       Medications Ordered in ED Medications  ipratropium-albuterol (DUONEB) 0.5-2.5 (3) MG/3ML nebulizer solution 3 mL (3 mLs Nebulization Given 09/17/22 0426)  nitroGLYCERIN (NITROGLYN) 2 % ointment 1 inch (1 inch Topical Given 09/17/22 0013)  albuterol (PROVENTIL) (2.5 MG/3ML) 0.083% nebulizer solution (10 mg/hr Nebulization Given 09/17/22 0016)  LORazepam (ATIVAN) injection 0.5 mg (0.5 mg Intravenous Given 09/17/22 0100)  sodium chloride 0.9 % bolus 1,000 mL (0 mLs Intravenous Stopped 09/17/22 0613)  iohexol (OMNIPAQUE) 350 MG/ML injection 75 mL (75 mLs Intravenous Contrast Given 09/17/22 0429)    ED Course/ Medical Decision Making/ A&P                             Medical Decision Making Amount and/or Complexity of Data Reviewed Labs: ordered. Radiology: ordered.  Risk Prescription drug management. Decision regarding hospitalization.   This patient presents to the ED for concern of respiratory distress, this involves an extensive number of treatment options, and is a complaint that carries with it a high risk of complications and morbidity.  The differential diagnosis includes pneumonia, PE, ACS, COPD, CHF   Additional history obtained:  Additional history obtained from EMS External records from outside source obtained and reviewed including cardiology notes   Co morbidities that complicate the patient evaluation  CAD, COPD  Social Determinants of  Health:  N/A    Lab Tests:  I Ordered, and personally interpreted labs.  The pertinent results include: CBC shows leukocytosis of 14.3, CMP reveals glucose 153, creatinine 1.35, lactic 1.2, for troponin is 6, VBG reveals pH of 7.2, pCO2 of 73, CO2 68, bicarb 29   Imaging Studies ordered:  I ordered imaging studies including  chest x-ray I independently visualized and interpreted imaging which showed hyperinflation of the lungs I agree with the radiologist interpretation   Cardiac Monitoring:  The patient was maintained on a cardiac monitor.  I personally viewed and interpreted the cardiac monitored which showed an underlying rhythm of: Sinus without signs of ischemia   Medicines ordered and prescription drug management:  I ordered medication including bronchodilator, nitro I have reviewed the patients home medicines and have made adjustments as needed  Critical Interventions:  Presents with Rester distress, tripoding, will place on BiPAP Reassessed lung sounds have improved slightly but is still tight sounding, currently on continuous neb, will add on Ativan as he feels slightly anxious.   Reevaluation:  On reassessment after completion of continuous neb and Ativan patient states he is feeling much better, lung sounds have improved, still slight wheezing but is moving air freely.  Since patient not endorsing cough congestion, and has history of squamous cell carcinoma of the lungs, and concern for possible PE, will send down for CTA for further evaluation.  CTA is negative, patient is resting comfortably, recessed lung sounds the improved, vital signs have improved, will admit to medicine.  Consultations Obtained:  Talk to Dr. Norm Parcel who will admit the patient.   Test Considered:  N/A    Rule out I have low suspicion for ACS as history is atypical, EKG was sinus rhythm without signs of ischemia, patient had a negative  troponin.  Low suspicion for PE, dissection  or aneurysm as CTA is negative.  Suspicion for CHF exacerbation is low as he is not volume overloaded my exam no pleural effusion seen on chest x-ray.  Suspicion for pneumonia is low she has no cough or congestion no fevers no chills x-ray negative for signs of consolidation.   Dispostion and problem list  After consideration of the diagnostic results and the patients response to treatment, I feel that the patent would benefit from admission.  Acute respiratory failure-acute exacerbation of COPD, patiently continued bronchodilators, steroids, and weaning off BiPAP.  Patient potentially benefit from pulmonology consultation if symptoms or not improving.  Due to shift change patient went off to [facility] PA-C she will admit to medicine.            Final Clinical Impression(s) / ED Diagnoses Final diagnoses:  Acute respiratory failure with hypoxia and hypercapnia [facility])    Rx / DC Orders ED Discharge Orders     None         Carroll Sage, PA-C 09/17/22 0640    Sabas Sous, MD 09/17/22 [phone]

## 2022-09-17 NOTE — Progress Notes (Signed)
Transported patient on BIPAP to CT and back to ED. Vital signs stable through out.

## 2022-09-18 DIAGNOSIS — N1831 Chronic kidney disease, stage 3a: Secondary | ICD-10-CM | POA: Diagnosis not present

## 2022-09-18 DIAGNOSIS — B20 Human immunodeficiency virus [HIV] disease: Secondary | ICD-10-CM | POA: Diagnosis not present

## 2022-09-18 DIAGNOSIS — J440 Chronic obstructive pulmonary disease with acute lower respiratory infection: Secondary | ICD-10-CM | POA: Diagnosis not present

## 2022-09-18 DIAGNOSIS — J9601 Acute respiratory failure with hypoxia: Secondary | ICD-10-CM | POA: Diagnosis not present

## 2022-09-18 LAB — BASIC METABOLIC PANEL
Anion gap: 8 (ref 5–15)
BUN: 15 mg/dL (ref 8–23)
CO2: 26 mmol/L (ref 22–32)
Calcium: 8.9 mg/dL (ref 8.9–10.3)
Chloride: 102 mmol/L (ref 98–111)
Creatinine, Ser: 1.33 mg/dL — ABNORMAL HIGH (ref 0.61–1.24)
GFR, Estimated: 57 mL/min — ABNORMAL LOW (ref >60)
Glucose, Bld: 98 mg/dL (ref 70–99)
Potassium: 3.9 mmol/L (ref 3.5–5.1)
Sodium: 136 mmol/L (ref 135–145)

## 2022-09-18 LAB — CBC
HCT: 43.7 % (ref 39.0–52.0)
Hemoglobin: 15.3 g/dL (ref 13.0–17.0)
MCH: 35.4 pg — ABNORMAL HIGH (ref 26.0–34.0)
MCHC: 35 g/dL (ref 30.0–36.0)
MCV: 101.2 fL — ABNORMAL HIGH (ref 80.0–100.0)
Platelets: 186 10*3/uL (ref 150–400)
RBC: 4.32 MIL/uL (ref 4.22–5.81)
RDW: 12.7 % (ref 11.5–15.5)
WBC: 13.9 10*3/uL — ABNORMAL HIGH (ref 4.0–10.5)
nRBC: 0 % (ref 0.0–0.2)

## 2022-09-18 MED ORDER — REVEFENACIN 175 MCG/3ML IN SOLN: 175.0000 ug | Freq: Every day | RESPIRATORY_TRACT | Status: DC

## 2022-09-18 MED ORDER — BUDESONIDE 0.25 MG/2ML IN SUSP
0.2500 mg | Freq: Two times a day (BID) | RESPIRATORY_TRACT | Status: DC
  Administered 2022-09-18 – 2022-09-19 (×3): 0.25 mg via RESPIRATORY_TRACT
  Filled 2022-09-18 (×3): qty 2

## 2022-09-18 MED ORDER — ARFORMOTEROL TARTRATE 15 MCG/2ML IN NEBU: 15.0000 ug | INHALATION_SOLUTION | Freq: Two times a day (BID) | RESPIRATORY_TRACT | Status: DC

## 2022-09-18 MED ORDER — ARFORMOTEROL TARTRATE 15 MCG/2ML IN NEBU
15.0000 ug | INHALATION_SOLUTION | Freq: Two times a day (BID) | RESPIRATORY_TRACT | Status: DC
  Administered 2022-09-18 – 2022-09-19 (×2): 15 ug via RESPIRATORY_TRACT
  Filled 2022-09-18 (×2): qty 2

## 2022-09-18 MED ORDER — REVEFENACIN 175 MCG/3ML IN SOLN
175.0000 ug | Freq: Every day | RESPIRATORY_TRACT | Status: DC
  Administered 2022-09-19: 175 ug via RESPIRATORY_TRACT
  Filled 2022-09-18: qty 3

## 2022-09-18 NOTE — Evaluation (Signed)
Occupational Therapy Evaluation and Discharge Patient Details Name: [name] Kim MRN: [id number] DOB: 31-Jan-1951 Today's Date: 09/18/2022   History of Present Illness 72 y.o. male who presents 09/17/22 with complaints of shortness of breath, tachycardia, and requiring BiPAP; CTA negative for PE   PMH- significant of hyperlipidemia, CAD, HIV, COPD, malignant lung nodule s/p resection, CKD, seizure disorder, AAA, and tobacco abuse   Clinical Impression   Pt is functioning modified independently in ADLs. Educated in energy conservation and provided written handout. No further OT needs.     Recommendations for follow up therapy are one component of a multi-disciplinary discharge planning process, led by the attending physician.  Recommendations may be updated based on patient status, additional functional criteria and insurance authorization.   Assistance Recommended at Discharge    Patient can return home with the following Assistance with cooking/housework    Functional Status Assessment     Equipment Recommendations  None recommended by OT    Recommendations for Other Services       Precautions / Restrictions Precautions Precautions: Other (comment) Precaution Comments: watch sats Restrictions Weight Bearing Restrictions: No      Mobility Bed Mobility   Independent                  Transfers    Independent                      Balance                                           ADL either performed or assessed with clinical judgement   ADL Overall ADL's : Modified independent                                             Vision Baseline Vision/History: 1 Wears glasses Ability to See in Adequate Light: 0 Adequate Patient Visual Report: No change from baseline       Perception     Praxis      Pertinent Vitals/Pain       Hand Dominance Right   Extremity/Trunk Assessment Upper Extremity  Assessment Upper Extremity Assessment: Overall WFL for tasks assessed   Lower Extremity Assessment Lower Extremity Assessment: Defer to PT evaluation   Cervical / Trunk Assessment Cervical / Trunk Assessment: Normal   Communication Communication Communication: No difficulties   Cognition                                             General Comments      Exercises     Shoulder Instructions      Home Living Family/patient expects to be discharged to:: Private residence Living Arrangements: Spouse/significant other Available Help at Discharge: Family;Available 24 hours/day Type of Home: House Home Access: Level entry     Home Layout: Two level;Full bath on main level;Able to live on main level with bedroom/bathroom     Bathroom Shower/Tub: Producer, television/film/video: Standard     Home Equipment: Cane - single point          Prior Functioning/Environment Prior Level of Function : Independent/Modified Independent  OT Problem List:        OT Treatment/Interventions:      OT Goals(Current goals can be found in the care plan section)    OT Frequency:      Co-evaluation              AM-PAC OT "6 Clicks" Daily Activity     Outcome Measure Help from another person eating meals?: None Help from another person taking care of personal grooming?: None Help from another person toileting, which includes using toliet, bedpan, or urinal?: None Help from another person bathing (including washing, rinsing, drying)?: None Help from another person to put on and taking off regular upper body clothing?: None Help from another person to put on and taking off regular lower body clothing?: None 6 Click Score: 24   End of Session    Activity Tolerance: Patient tolerated treatment well Patient left: in bed;with call bell/phone within reach  OT Visit Diagnosis: Other (comment) (decreased activity tolerance)                 Time: 6578-4696 OT Time Calculation (min): 27 min Charges:  OT General Charges $OT Visit: 1 Visit OT Evaluation $OT Eval Low Complexity: 1 Low OT Treatments $Self Care/Home Management : 8-22 mins  Berna Spare, OTR/L Acute Rehabilitation Services Office: [phone]   Evern Bio 09/18/2022, 12:13 PM

## 2022-09-18 NOTE — H&P (Signed)
  Transition of Care [facility]) Screening Note   Patient Details  Name: SID GREENER Date of Birth: 01-08-1951   Transition of Care [facility]) CM/SW Contact:    Gordy Clement, RN Phone Number: 09/18/2022, 9:18 AM    Transition of Care Department [facility]) has reviewed patient   He is from home with his partner.  No O2 at BL but requiring here-   We will continue to monitor patient advancement through interdisciplinary progression rounds. If new patient transition needs arise, please place a TOC consult.

## 2022-09-18 NOTE — Progress Notes (Signed)
Oxygen saturation on RA before walking 94%.  Oxygen saturation walking 93% on RA. Patient able to walk with assistance no oxygen required.

## 2022-09-18 NOTE — Progress Notes (Signed)
PROGRESS NOTE        PATIENT DETAILS Name: [name] Kim Age: 72 y.o. Sex: male Date of Birth: November 30, 1950 Admit Date: 09/17/2022 Admitting Physician Clydie Braun, MD ZOX:[id number], Onalee Hua, MD  Brief Summary: Patient is a 72 y.o.  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  Significant events: 4/28>> admit to [facility]-COPD exacerbation-hypercarbia-BiPAP 4/29>> on 5 L of oxygen via [place]  Significant studies: 4/28>> CTA chest: No PE-bronchitis/emphysema with airway impactions.  Significant microbiology data: 4/28>> COVID PCR: Negative 4/28>> respiratory virus panel: Not detected  Procedures: None  Consults: None  Subjective: Lying comfortably in bed-denies any chest pain or shortness of breath.  Feels better-on 5 L of oxygen  Objective: Vitals: Blood pressure 114/64, pulse 64, temperature 98 F (36.7 C), temperature source Oral, resp. rate 16, height 5' 6.5" (1.689 m), weight 64 kg, SpO2 94 %.   Exam: Gen Exam:Alert awake-not in any distress HEENT:atraumatic, normocephalic Chest: Prolonged expiration-still wheezing. CVS:S1S2 regular Abdomen:soft non tender, non distended Extremities:no edema Neurology: Non focal Skin: no rash  Pertinent Labs/Radiology:    Latest Ref Rng & Units 09/18/2022    5:26 AM 09/17/2022    8:15 AM 09/17/2022    4:52 AM  CBC  WBC 4.0 - 10.5 K/uL 13.9     Hemoglobin 13.0 - 17.0 g/dL 40.9  81.1  91.4   Hematocrit 39.0 - 52.0 % 43.7  43.0  44.0   Platelets 150 - 400 K/uL 186       Lab Results  Component Value Date   NA 136 09/18/2022   K 3.9 09/18/2022   CL 102 09/18/2022   CO2 26 09/18/2022      Assessment/Plan: Acute hypoxic/hypercarbic  respiratory failure secondary to COPD exacerbation Improved-liberated off BiPAP last night-stable on 5 L Continue bronchodilators/empiric Zithromax/Mucinex/incentive spirometry Encourage mobility Titrate down FiO2-if clinical improvement continues-Home 4/30.  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   Weight as of this encounter: 64 kg.   Code status:   Code Status: Full Code   DVT Prophylaxis: enoxaparin (LOVENOX) injection 40 mg Start: 09/17/22 1600  Family Communication: None at bedside   Disposition Plan: Status is: Inpatient Remains inpatient appropriate because: Severity of illness   Planned Discharge Destination:Home   Diet: Diet Order             Diet Heart Room service appropriate? Yes; Fluid consistency: Thin  Diet effective now                     Antimicrobial agents: Anti-infectives (From admission, onward)    Start     Dose/Rate Route Frequency Ordered Stop   09/18/22 0945  azithromycin (ZITHROMAX)  tablet 500 mg       See Hyperspace for full Linked Orders Report.   500 mg Oral Daily 09/17/22 0942 09/22/22 0959   09/17/22 1030  bictegravir-emtricitabine-tenofovir AF (BIKTARVY) 50-200-25 MG per tablet 1 tablet        1 tablet Oral Daily 09/17/22 0942     09/17/22 0945  azithromycin (ZITHROMAX) 500 mg in sodium chloride 0.9 % 250 mL IVPB       See Hyperspace for full Linked Orders Report.   500 mg 250 mL/hr over 60 Minutes Intravenous Every 24 hours 09/17/22 0942 09/17/22  1203        MEDICATIONS: Scheduled Meds:  aspirin EC  81 mg Oral Daily   atorvastatin  20 mg Oral QPM   azithromycin  500 mg Oral Daily   bictegravir-emtricitabine-tenofovir AF  1 tablet Oral Daily   budesonide (PULMICORT) nebulizer solution  0.25 mg Nebulization BID   enoxaparin (LOVENOX) injection  40 mg Subcutaneous Q24H   ezetimibe  10 mg Oral Daily   folic acid  500 mcg Oral q AM   guaiFENesin  600 mg Oral BID   ipratropium-albuterol  3 mL Nebulization QID   lamoTRIgine  100 mg Oral BID   predniSONE  40 mg Oral Q breakfast   sodium chloride flush  3 mL Intravenous Q12H   Continuous Infusions: PRN Meds:.acetaminophen **OR** acetaminophen, albuterol, LORazepam, ondansetron **OR** ondansetron (ZOFRAN) IV   I have personally reviewed following labs and imaging studies  LABORATORY DATA: CBC: Recent Labs  Lab 09/17/22 0009 09/17/22 0025 09/17/22 0028 09/17/22 0452 09/17/22 0815 09/18/22 0526  WBC 14.3*  --   --   --   --  13.9*  NEUTROABS 7.9*  --   --   --   --   --   HGB 16.5 17.3* 17.0 15.0 14.6 15.3  HCT 49.0 51.0 50.0 44.0 43.0 43.7  MCV 103.4*  --   --   --   --  101.2*  PLT 217  --   --   --   --  186    Basic Metabolic Panel: Recent Labs  Lab 09/17/22 0009 09/17/22 0025 09/17/22 0028 09/17/22 0452 09/17/22 0815 09/18/22 0526  NA 137 138 138 136 136 136  K 4.5 4.7 4.8 4.4 4.4 3.9  CL 102 103  --   --   --  102  CO2 27  --   --   --   --  26  GLUCOSE 153* 148*  --   --   --  98  BUN 12 13  --   --   --  15  CREATININE 1.35* 1.30*  --   --   --  1.33*  CALCIUM 9.5  --   --   --   --  8.9    GFR: Estimated Creatinine Clearance: 45.4 mL/min (A) (by C-G formula based on SCr of 1.33 mg/dL (H)).  Liver Function Tests: Recent Labs  Lab 09/17/22 0009  AST 23  ALT 20  ALKPHOS 64  BILITOT 1.2  PROT 7.6  ALBUMIN 4.4   No results for input(s): "LIPASE", "AMYLASE" in the last 168 hours. No results for input(s): "AMMONIA" in the last 168  hours.  Coagulation Profile: No results for input(s): "INR", "PROTIME" in the last 168 hours.  Cardiac Enzymes: No results for input(s): "CKTOTAL", "CKMB", "CKMBINDEX", "TROPONINI" in the last 168 hours.  BNP (last 3 results) No results for input(s): "PROBNP" in the last 8760 hours.  Lipid Profile: No results for input(s): "CHOL", "HDL", "LDLCALC", "TRIG", "CHOLHDL", "LDLDIRECT" in the last 72 hours.  Thyroid Function Tests: No results for input(s): "TSH", "T4TOTAL", "FREET4", "T3FREE", "THYROIDAB" in the last 72 hours.  Anemia Panel: No results for input(s): "VITAMINB12", "FOLATE", "FERRITIN", "TIBC", "IRON", "RETICCTPCT" in the last 72 hours.  Urine analysis:    Component Value Date/Time   COLORURINE YELLOW 10/29/2012 0924   APPEARANCEUR CLEAR 10/29/2012 0924   LABSPEC 1.010 10/29/2012 0924   PHURINE 5.5 10/29/2012 0924   GLUCOSEU NEG 10/29/2012 0924   HGBUR NEG 10/29/2012 0924   BILIRUBINUR NEG 10/29/2012 0924   KETONESUR NEG 10/29/2012 0924   PROTEINUR NEG 10/29/2012 0924   UROBILINOGEN 0.2 10/29/2012 0924   NITRITE NEG 10/29/2012 0924   LEUKOCYTESUR NEG 10/29/2012 0924    Sepsis Labs: Lactic Acid, Venous    Component Value Date/Time   LATICACIDVEN 1.8 09/17/2022 0218    MICROBIOLOGY: Recent Results (from the past 240 hour(s))  SARS Coronavirus 2 by RT PCR (hospital order, performed in [facility] hospital lab) *cepheid single result test* Anterior Nasal Swab     Status: Abnormal   Collection Time: 09/17/22 12:12 AM   Specimen: Anterior Nasal Swab  Result Value Ref Range Status   SARS Coronavirus 2 by RT PCR NOT DETECTED (A) NEGATIVE Final    Comment: Performed at [facility], [address] [place], [place] [address]  Respiratory (~20 pathogens) panel by PCR     Status: None   Collection Time: 09/17/22  2:00 PM  Result Value Ref Range Status   Adenovirus NOT DETECTED NOT DETECTED Final   Coronavirus 229E NOT DETECTED NOT DETECTED Final    Comment:  (NOTE) The Coronavirus on the Respiratory Panel, DOES NOT test for the novel  Coronavirus (2019 nCoV)    Coronavirus HKU1 NOT DETECTED NOT DETECTED Final   Coronavirus NL63 NOT DETECTED NOT DETECTED Final   Coronavirus OC43 NOT DETECTED NOT DETECTED Final   Metapneumovirus NOT DETECTED NOT DETECTED Final   Rhinovirus / Enterovirus NOT DETECTED NOT DETECTED Final   Influenza A NOT DETECTED NOT DETECTED Final   Influenza B NOT DETECTED NOT DETECTED Final   Parainfluenza Virus 1 NOT DETECTED NOT DETECTED Final   Parainfluenza Virus 2 NOT DETECTED NOT DETECTED Final   Parainfluenza Virus 3 NOT DETECTED NOT DETECTED Final   Parainfluenza Virus 4 NOT DETECTED NOT DETECTED Final   Respiratory Syncytial Virus NOT DETECTED NOT DETECTED Final   Bordetella pertussis NOT DETECTED NOT DETECTED Final   Bordetella Parapertussis NOT DETECTED NOT DETECTED Final   Chlamydophila pneumoniae NOT DETECTED NOT DETECTED Final   Mycoplasma pneumoniae NOT DETECTED NOT DETECTED Final    Comment: Performed at [facility], [address] [place], [place] [address]    RADIOLOGY STUDIES/RESULTS: CT Angio Chest PE W and/or Wo Contrast  Result Date: 09/17/2022 CLINICAL DATA:  Pulmonary embolism suspected EXAM: CT ANGIOGRAPHY CHEST WITH CONTRAST TECHNIQUE: Multidetector CT imaging of the chest was performed using the standard protocol during bolus administration of intravenous contrast. Multiplanar CT image reconstructions and MIPs were obtained to evaluate the vascular anatomy. RADIATION DOSE REDUCTION: This exam was performed according to the departmental dose-optimization program which includes automated exposure control, adjustment of the mA and/or kV according to patient size and/or use of iterative reconstruction technique. CONTRAST:  75mL OMNIPAQUE IOHEXOL 350 MG/ML SOLN COMPARISON:  06/12/2022 FINDINGS: Cardiovascular: Satisfactory opacification of the pulmonary arteries to the segmental level. No evidence  of pulmonary embolism. Normal  heart size. No pericardial effusion. Atherosclerosis with CABG. Mediastinum/Nodes: Negative for mass or adenopathy. Lungs/Pleura: Areas of airway impaction in the lower lobes. Generalized airway thickening. Centrilobular emphysema at the apices. There is no edema, consolidation, effusion, or pneumothorax. Postoperative left lung base for nodule resection. Upper Abdomen: Cholecystectomy.  No acute finding Musculoskeletal: No acute finding. Review of the MIP images confirms the above findings. IMPRESSION: 1. Negative for pulmonary embolism. 2. Bronchitis and emphysema with airway impactions in the lower lobes. Electronically Signed   By: Tiburcio Pea M.D.   On: 09/17/2022 05:10   DG Chest Portable 1 View  Result Date: 09/17/2022 CLINICAL DATA:  Shortness of breath. EXAM: PORTABLE CHEST 1 VIEW COMPARISON:  05/31/2021. FINDINGS: The heart size and mediastinal contours are within normal limits. There is atherosclerotic calcification of the aorta. Hyperinflation of the lungs is noted. No consolidation, effusion, or pneumothorax. Sternotomy wires are present over the midline. IMPRESSION: Hyperinflation of the lungs with no active disease. Electronically Signed   By: Thornell Sartorius M.D.   On: 09/17/2022 00:36     LOS: 1 day   Jeoffrey Massed, MD  [facility]    To contact the attending provider between 7A-7P or the covering provider during after hours 7P-7A, please log into the web site [email or web address] and access using universal [place] password for that web site. If you do not have the password, please call the hospital operator.  09/18/2022, 9:16 AM

## 2022-09-18 NOTE — Progress Notes (Signed)
Physical Therapy Treatment Patient Details Name: [name] Kim MRN: [id number] DOB: 12-Apr-1951 Today's Date: 09/18/2022   History of Present Illness 72 y.o. male who presents 09/17/22 with complaints of shortness of breath, tachycardia, and requiring BiPAP; CTA negative for PE   PMH- significant of hyperlipidemia, CAD, HIV, COPD, malignant lung nodule s/p resection, CKD, seizure disorder, AAA, and tobacco abuse    PT Comments    Pt tolerated today's session well sats maintaining on 4L O2 [place] with ambulation. Pt with mild imbalance noted with ambulation but turning 180 degrees without difficulty, no overt LOB, no reaching for UE support. Pt reports moderate fatigue after mobility, still below his baseline of ambulating 1-2 miles at a time on room air, but progressing well from last session. Acute PT will continue to follow pt to progress endurance with mobility, discharge recommendations remain appropriate.     Recommendations for follow up therapy are one component of a multi-disciplinary discharge planning process, led by the attending physician.  Recommendations may be updated based on patient status, additional functional criteria and insurance authorization.  Follow Up Recommendations       Assistance Recommended at Discharge Intermittent Supervision/Assistance  Patient can return home with the following Assistance with cooking/housework;Help with stairs or ramp for entrance   Equipment Recommendations  None recommended by PT    Recommendations for Other Services       Precautions / Restrictions Precautions Precautions: Other (comment) Precaution Comments: watch sats Restrictions Weight Bearing Restrictions: No     Mobility  Bed Mobility Overal bed mobility: Independent                  Transfers Overall transfer level: Modified independent Equipment used: None Transfers: Sit to/from Stand Sit to Stand: Modified independent (Device/Increase time)                 Ambulation/Gait Ambulation/Gait assistance: Supervision Gait Distance (Feet): 300 Feet Assistive device: None Gait Pattern/deviations: Step-through pattern, Narrow base of support Gait velocity: grossly WFL     General Gait Details: mild imbalance, noted narrow BOS with toed in gait but no overt LOB. Able to turn 180 degrees without issue   Stairs             Wheelchair Mobility    Modified Rankin (Stroke Patients Only)       Balance Overall balance assessment: Mild deficits observed, not formally tested                                          Cognition Arousal/Alertness: Awake/alert Behavior During Therapy: WFL for tasks assessed/performed Overall Cognitive Status: Within Functional Limits for tasks assessed                                          Exercises Other Exercises Other Exercises: sit<>stand x10 reps for endurance    General Comments General comments (skin integrity, edema, etc.): 93% or greater sats on 4L O2 [place]      Pertinent Vitals/Pain Pain Assessment Pain Assessment: No/denies pain    Home Living                          Prior Function            PT  Goals (current goals can now be found in the care plan section) Acute Rehab PT Goals Patient Stated Goal: return to walking 1+ miles as he did recently PT Goal Formulation: With patient Time For Goal Achievement: 10/01/22 Potential to Achieve Goals: Good Progress towards PT goals: Progressing toward goals    Frequency    Min 3X/week      PT Plan Current plan remains appropriate    Co-evaluation              AM-PAC PT "6 Clicks" Mobility   Outcome Measure  Help needed turning from your back to your side while in a flat bed without using bedrails?: None Help needed moving from lying on your back to sitting on the side of a flat bed without using bedrails?: None Help needed moving to and from a bed to a chair (including a  wheelchair)?: None Help needed standing up from a chair using your arms (e.g., wheelchair or bedside chair)?: None Help needed to walk in hospital room?: A Little Help needed climbing 3-5 steps with a railing? : A Little 6 Click Score: 22    End of Session Equipment Utilized During Treatment: Oxygen Activity Tolerance: Patient tolerated treatment well Patient left: in bed;with call bell/phone within reach;with bed alarm set Nurse Communication: Mobility status PT Visit Diagnosis: Difficulty in walking, not elsewhere classified (R26.2)     Time: 2595-6387 PT Time Calculation (min) (ACUTE ONLY): 26 min  Charges:  $Gait Training: 8-22 mins $Therapeutic Activity: 8-22 mins                     Lindalou Hose, PT DPT Acute Rehabilitation Services Office [phone]    Leonie Man 09/18/2022, 11:25 AM

## 2022-09-18 NOTE — Telephone Encounter (Signed)
PA has been APPROVED from 09/15/2022-05/21/2023

## 2022-09-19 ENCOUNTER — Other Ambulatory Visit (HOSPITAL_COMMUNITY): Payer: Self-pay

## 2022-09-19 DIAGNOSIS — J9601 Acute respiratory failure with hypoxia: Secondary | ICD-10-CM | POA: Diagnosis not present

## 2022-09-19 DIAGNOSIS — J440 Chronic obstructive pulmonary disease with acute lower respiratory infection: Secondary | ICD-10-CM | POA: Diagnosis not present

## 2022-09-19 DIAGNOSIS — N1831 Chronic kidney disease, stage 3a: Secondary | ICD-10-CM | POA: Diagnosis not present

## 2022-09-19 DIAGNOSIS — Z85118 Personal history of other malignant neoplasm of bronchus and lung: Secondary | ICD-10-CM | POA: Diagnosis not present

## 2022-09-19 MED ORDER — AZITHROMYCIN 500 MG PO TABS
500.0000 mg | ORAL_TABLET | Freq: Every day | ORAL | 0 refills | Status: DC
  Filled 2022-09-19: qty 2, 2d supply, fill #0

## 2022-09-19 MED ORDER — PREDNISONE 10 MG PO TABS
ORAL_TABLET | ORAL | 0 refills | Status: DC
  Filled 2022-09-19: qty 19, 7d supply, fill #0

## 2022-09-19 NOTE — Progress Notes (Signed)
Physical Therapy Treatment Patient Details Name: [name] Kim MRN: [id number] DOB: 1950/05/24 Today's Date: 09/19/2022   History of Present Illness 72 y.o. male who presents 09/17/22 with complaints of shortness of breath, tachycardia, and requiring BiPAP; CTA negative for PE   PMH- significant of hyperlipidemia, CAD, HIV, COPD, malignant lung nodule s/p resection, CKD, seizure disorder, AAA, and tobacco abuse    PT Comments    Pt tolerated today's session well, mobilizing on room air without complaints of fatigue. Pt ambulating without imbalance and appearing back to his baseline. Pt reports a flight of stairs to the second floor of his home that he doesn't have to ascend, practiced today in case pt wanted to go upstairs, pt completing with modified independence with use of a handrail. Pt reports feeling close or at his mobility baseline. Pt with no further benefit from skilled acute PT at this time, no current need for PT upon discharge. Educated pt on mobilizing at home but easing back into his activity, pt verbalizes understanding and agreement. Acute PT will sign off, all goals met or adequate for discharge.     Recommendations for follow up therapy are one component of a multi-disciplinary discharge planning process, led by the attending physician.  Recommendations may be updated based on patient status, additional functional criteria and insurance authorization.  Follow Up Recommendations       Assistance Recommended at Discharge PRN  Patient can return home with the following Assistance with cooking/housework   Equipment Recommendations  None recommended by PT    Recommendations for Other Services       Precautions / Restrictions Precautions Precautions: Other (comment) Precaution Comments: watch sats Restrictions Weight Bearing Restrictions: No     Mobility  Bed Mobility Overal bed mobility: Independent                  Transfers Overall transfer level:  Independent Equipment used: None Transfers: Sit to/from Stand                  Ambulation/Gait Ambulation/Gait assistance: Independent Educational psychologist (Feet): 550 Feet Assistive device: None Gait Pattern/deviations: Step-through pattern, Narrow base of support Gait velocity: grossly WFL     General Gait Details: no evidence of imbalance during today's session, pt able to change gait speed and perform head turns without LOB or change in gait speed, performing 180 degree turns without issue   Stairs Stairs: Yes Stairs assistance: Modified independent (Device/Increase time) Stair Management: One rail Right, Alternating pattern, Forwards Number of Stairs: 18 General stair comments: pt performing without issue, even ascending 2 steps at a time for the first flight   Wheelchair Mobility    Modified Rankin (Stroke Patients Only)       Balance Overall balance assessment: Modified Independent                                          Cognition Arousal/Alertness: Awake/alert Behavior During Therapy: WFL for tasks assessed/performed Overall Cognitive Status: Within Functional Limits for tasks assessed                                          Exercises      General Comments General comments (skin integrity, edema, etc.): stable on room air, no reports of fatigue throughout  session      Pertinent Vitals/Pain Pain Assessment Pain Assessment: No/denies pain    Home Living                          Prior Function            PT Goals (current goals can now be found in the care plan section) Acute Rehab PT Goals Patient Stated Goal: return to walking 1+ miles as he did recently PT Goal Formulation: All assessment and education complete, DC therapy Progress towards PT goals: Goals met/education completed, patient discharged from PT    Frequency    Min 3X/week      PT Plan Current plan remains appropriate     Co-evaluation              AM-PAC PT "6 Clicks" Mobility   Outcome Measure  Help needed turning from your back to your side while in a flat bed without using bedrails?: None Help needed moving from lying on your back to sitting on the side of a flat bed without using bedrails?: None Help needed moving to and from a bed to a chair (including a wheelchair)?: None Help needed standing up from a chair using your arms (e.g., wheelchair or bedside chair)?: None Help needed to walk in hospital room?: None Help needed climbing 3-5 steps with a railing? : None 6 Click Score: 24    End of Session   Activity Tolerance: Patient tolerated treatment well Patient left: in bed;with call bell/phone within reach;with family/visitor present Nurse Communication: Mobility status PT Visit Diagnosis: Difficulty in walking, not elsewhere classified (R26.2)     Time: 6962-9528 PT Time Calculation (min) (ACUTE ONLY): 8 min  Charges:  $Gait Training: 8-22 mins                     Lindalou Hose, PT DPT Acute Rehabilitation Services Office [phone]    Leonie Man 09/19/2022, 2:54 PM

## 2022-09-19 NOTE — Discharge Summary (Signed)
PATIENT DETAILS Name: [name] Kim Age: 72 y.o. Sex: male Date of Birth: Oct 15, 1950 MRN: [id number]. Admitting Physician: Clydie Braun, MD WUJ:[id number], Onalee Hua, MD  Admit Date: 09/17/2022 Discharge date: 09/19/2022  Recommendations for Outpatient Follow-up:  Follow up with PCP in 1-2 weeks Please obtain CMP/CBC in one week PCP to repeat ultrasound every 2 years-as recommended by radiology-for AAA  Admitted From:  Home  Disposition: Home   Discharge Condition: good  CODE STATUS:   Code Status: Full Code   Diet recommendation:  Diet Order             Diet - low sodium heart healthy           Diet Heart Room service appropriate? Yes; Fluid consistency: Thin  Diet effective now                    Brief Summary: Patient is a 72 y.o.  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   Significant events: 4/28>> admit to [facility]-COPD exacerbation-hypercarbia-BiPAP 4/29>> on 5 L of oxygen via [place]   Significant studies: 4/28>> CTA chest: No PE-bronchitis/emphysema with airway impactions.   Significant microbiology data: 4/28>> COVID PCR: Negative 4/28>> respiratory virus panel: Not detected   Procedures: None   Consults: None   Brief Hospital Course: Acute hypoxic/hypercarbic respiratory failure secondary to COPD exacerbation Initially required BiPAP-treated with steroids/bronchodilators/Zithromax-rapidly improved-by day of discharge on room air-back to baseline per patient. Resume usual inhaler regimen-Taper steroids for the next several days Follow-up with PCP/primary pulmonologist.  Patient very anxious to go home today-as he has to go to [place] for his mother's head stone reveal.  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   Weight as of this encounter: 64 kg.    Discharge Diagnoses:  Principal Problem:   Acute respiratory failure with hypoxia and hypercapnia (HCC) Active Problems:   Acute bronchitis with chronic obstructive pulmonary disease (COPD) (HCC)   Leukocytosis   CKD (chronic kidney disease), stage III (HCC)   Human immunodeficiency virus (HIV) disease (HCC)   Seizure disorder (HCC)   Dyslipidemia   History of lung cancer   Tobacco abuse   Abdominal aortic aneurysm (AAA) without rupture [facility])   Discharge Instructions:  Activity:  As tolerated   Discharge Instructions     Call MD for:  persistant dizziness or light-headedness   Complete by: As directed    Diet - low sodium heart healthy   Complete by: As directed    Discharge instructions   Complete by: As directed    Follow with Primary MD  Tracey Harries, MD in 1-2 weeks  Please get a complete blood count and chemistry panel checked by your Primary MD at your next visit, and again as instructed by your  Primary MD.  Get Medicines reviewed and adjusted: Please take all your medications with you for your next visit with your Primary MD  Laboratory/radiological data: Please request your Primary MD to go over all hospital tests and procedure/radiological results at the follow up, please ask your Primary MD to get all Hospital records sent to his/her office.  In some cases, they will be blood work, cultures and biopsy  results pending at the time of your discharge. Please request that your primary care M.D. follows up on these results.  Also Note the following: If you experience worsening of your admission symptoms, develop shortness of breath, life threatening emergency, suicidal or homicidal thoughts you must seek medical attention immediately by calling 911 or calling your MD immediately  if symptoms less severe.  You must read complete instructions/literature along with all the possible adverse reactions/side effects for all the Medicines you take and that have been prescribed to you. Take any new Medicines after you have completely understood and accpet all the possible adverse reactions/side effects.   Do not drive when taking Pain medications or sleeping medications (Benzodaizepines)  Do not take more than prescribed Pain, Sleep and Anxiety Medications. It is not advisable to combine anxiety,sleep and pain medications without talking with your primary care practitioner  Special Instructions: If you have smoked or chewed Tobacco  in the last 2 yrs please stop smoking, stop any regular Alcohol  and or any Recreational drug use.  Wear Seat belts while driving.  Please note: You were cared for by a hospitalist during your hospital stay. Once you are discharged, your primary care physician will handle any further medical issues. Please note that NO REFILLS for any discharge medications will be authorized once you are discharged, as it is imperative that you return to your primary care physician (or establish a relationship with a primary care physician if you do not have one) for your post hospital discharge needs so that they can reassess your need for medications and monitor your lab values.   Increase activity slowly   Complete by: As directed       Allergies as of 09/19/2022       Reactions   Zidovudine Rash, Other (See Comments)   Caused bone marrow problems  2000        Medication List      TAKE these medications    acetaminophen 500 MG tablet Commonly known as: TYLENOL Take 250 mg by mouth every 6 (six) hours as needed (pain).   albuterol 108 (90 Base) MCG/ACT inhaler Commonly known as: VENTOLIN HFA INHALE 1 PUFF INTO THE LUNGS EVERY 6 HOURS AS NEEDED FOR WHEEZING OR SHORTNESS OF BREATH   Anoro Ellipta 62.5-25 MCG/ACT Aepb Generic drug: umeclidinium-vilanterol Inhale 1 puff into the lungs daily.   aspirin 81 MG tablet Take 81 mg by mouth daily.   atorvastatin 20 MG tablet Commonly known as: LIPITOR TAKE 1 TABLET(20 MG) BY MOUTH DAILY AT 6 PM   azithromycin 500 MG tablet Commonly known as: ZITHROMAX Take 1 tablet (500 mg total) by mouth daily.   Biktarvy 50-200-25 MG Tabs tablet Generic drug: bictegravir-emtricitabine-tenofovir AF Take 1 tablet by mouth daily.   ezetimibe 10 MG tablet Commonly known as: ZETIA TAKE 1 TABLET(10 MG) BY MOUTH DAILY   folic acid 400 MCG tablet Commonly known as: FOLVITE Take 400 mcg by mouth in the morning.   lamoTRIgine 100 MG tablet Commonly known as: LAMICTAL Take 100 mg by mouth 2 (two) times daily.  omega-3 acid ethyl esters 1 g capsule Commonly known as: LOVAZA TAKE 1 CAPSULE BY MOUTH TWICE DAILY   predniSONE 10 MG tablet Commonly known as: DELTASONE Take 4 tablets (40 mg) daily for 2 days, then, Take 3 tablets (30 mg) daily for 2 days, then, Take 2 tablets (20 mg) daily for 2 days, then, Take 1 tablets (10 mg) daily for 1 days, then stop        Follow-up Information     Tracey Harries, MD. Schedule an appointment as soon as possible for a visit in 1 week(s).   Specialty: Family Medicine Contact information: [address] [place] [address] [phone]                Allergies  Allergen Reactions   Zidovudine Rash and Other (See Comments)    Caused bone marrow problems  2000      Other Procedures/Studies: CT Angio Chest PE W and/or Wo Contrast  Result Date:  09/17/2022 CLINICAL DATA:  Pulmonary embolism suspected EXAM: CT ANGIOGRAPHY CHEST WITH CONTRAST TECHNIQUE: Multidetector CT imaging of the chest was performed using the standard protocol during bolus administration of intravenous contrast. Multiplanar CT image reconstructions and MIPs were obtained to evaluate the vascular anatomy. RADIATION DOSE REDUCTION: This exam was performed according to the departmental dose-optimization program which includes automated exposure control, adjustment of the mA and/or kV according to patient size and/or use of iterative reconstruction technique. CONTRAST:  75mL OMNIPAQUE IOHEXOL 350 MG/ML SOLN COMPARISON:  06/12/2022 FINDINGS: Cardiovascular: Satisfactory opacification of the pulmonary arteries to the segmental level. No evidence of pulmonary embolism. Normal heart size. No pericardial effusion. Atherosclerosis with CABG. Mediastinum/Nodes: Negative for mass or adenopathy. Lungs/Pleura: Areas of airway impaction in the lower lobes. Generalized airway thickening. Centrilobular emphysema at the apices. There is no edema, consolidation, effusion, or pneumothorax. Postoperative left lung base for nodule resection. Upper Abdomen: Cholecystectomy.  No acute finding Musculoskeletal: No acute finding. Review of the MIP images confirms the above findings. IMPRESSION: 1. Negative for pulmonary embolism. 2. Bronchitis and emphysema with airway impactions in the lower lobes. Electronically Signed   By: Tiburcio Pea M.D.   On: 09/17/2022 05:10   DG Chest Portable 1 View  Result Date: 09/17/2022 CLINICAL DATA:  Shortness of breath. EXAM: PORTABLE CHEST 1 VIEW COMPARISON:  05/31/2021. FINDINGS: The heart size and mediastinal contours are within normal limits. There is atherosclerotic calcification of the aorta. Hyperinflation of the lungs is noted. No consolidation, effusion, or pneumothorax. Sternotomy wires are present over the midline. IMPRESSION: Hyperinflation of the lungs with no  active disease. Electronically Signed   By: Thornell Sartorius M.D.   On: 09/17/2022 00:36     TODAY-DAY OF DISCHARGE:  Subjective:   [name] Kim today has no headache,no chest abdominal pain,no new weakness tingling or numbness, feels much better wants to go home today.   Objective:   Blood pressure 110/73, pulse 70, temperature 98 F (36.7 C), temperature source Oral, resp. rate 20, height 5' 6.5" (1.689 m), weight 64 kg, SpO2 93 %.  Intake/Output Summary (Last 24 hours) at 09/19/2022 0845 Last data filed at 09/18/2022 1834 Gross per 24 hour  Intake --  Output 900 ml  Net -900 ml   Filed Weights   09/17/22 0021  Weight: 64 kg    Exam: Awake Alert, Oriented *3, No new F.N deficits, Normal affect [place].AT,PERRAL Supple Neck,No JVD, No cervical lymphadenopathy appriciated.  Symmetrical Chest wall movement, Good air movement bilaterally, CTAB RRR,No  Gallops,Rubs or new Murmurs, No Parasternal Heave +ve B.Sounds, Abd Soft, Non tender, No organomegaly appriciated, No rebound -guarding or rigidity. No Cyanosis, Clubbing or edema, No new Rash or bruise   PERTINENT RADIOLOGIC STUDIES: No results found.   PERTINENT LAB RESULTS: CBC: Recent Labs    09/17/22 0009 09/17/22 0025 09/17/22 0815 09/18/22 0526  WBC 14.3*  --   --  13.9*  HGB 16.5   < > 14.6 15.3  HCT 49.0   < > 43.0 43.7  PLT 217  --   --  186   < > = values in this interval not displayed.   CMET CMP     Component Value Date/Time   NA 136 09/18/2022 0526   NA 139 12/20/2011 0000   K 3.9 09/18/2022 0526   CL 102 09/18/2022 0526   CO2 26 09/18/2022 0526   GLUCOSE 98 09/18/2022 0526   BUN 15 09/18/2022 0526   BUN 12 12/20/2011 0000   CREATININE 1.33 (H) 09/18/2022 0526   CREATININE 1.31 (H) 03/22/2022 0922   CALCIUM 8.9 09/18/2022 0526   PROT 7.6 09/17/2022 0009   ALBUMIN 4.4 09/17/2022 0009   AST 23 09/17/2022 0009   ALT 20 09/17/2022 0009   ALKPHOS 64 09/17/2022 0009   BILITOT 1.2 09/17/2022 0009    GFRNONAA 57 (L) 09/18/2022 0526   GFRNONAA 62 01/16/2012 1055   GFRNONAA 64 01/16/2012 1055   GFRAA 71 01/16/2012 1055   GFRAA 74 01/16/2012 1055    GFR Estimated Creatinine Clearance: 45.4 mL/min (A) (by C-G formula based on SCr of 1.33 mg/dL (H)). No results for input(s): "LIPASE", "AMYLASE" in the last 72 hours. No results for input(s): "CKTOTAL", "CKMB", "CKMBINDEX", "TROPONINI" in the last 72 hours. Invalid input(s): "POCBNP" No results for input(s): "DDIMER" in the last 72 hours. No results for input(s): "HGBA1C" in the last 72 hours. No results for input(s): "CHOL", "HDL", "LDLCALC", "TRIG", "CHOLHDL", "LDLDIRECT" in the last 72 hours. No results for input(s): "TSH", "T4TOTAL", "T3FREE", "THYROIDAB" in the last 72 hours.  Invalid input(s): "FREET3" No results for input(s): "VITAMINB12", "FOLATE", "FERRITIN", "TIBC", "IRON", "RETICCTPCT" in the last 72 hours. Coags: No results for input(s): "INR" in the last 72 hours.  Invalid input(s): "PT" Microbiology: Recent Results (from the past 240 hour(s))  SARS Coronavirus 2 by RT PCR (hospital order, performed in [facility] hospital lab) *cepheid single result test* Anterior Nasal Swab     Status: Abnormal   Collection Time: 09/17/22 12:12 AM   Specimen: Anterior Nasal Swab  Result Value Ref Range Status   SARS Coronavirus 2 by RT PCR NOT DETECTED (A) NEGATIVE Final    Comment: Performed at [facility], [address] [place], [place] [address]  Respiratory (~20 pathogens) panel by PCR     Status: None   Collection Time: 09/17/22  2:00 PM  Result Value Ref Range Status   Adenovirus NOT DETECTED NOT DETECTED Final   Coronavirus 229E NOT DETECTED NOT DETECTED Final    Comment: (NOTE) The Coronavirus on the Respiratory Panel, DOES NOT test for the novel  Coronavirus (2019 nCoV)    Coronavirus HKU1 NOT DETECTED NOT DETECTED Final   Coronavirus NL63 NOT DETECTED NOT DETECTED Final   Coronavirus OC43 NOT DETECTED NOT  DETECTED Final   Metapneumovirus NOT DETECTED NOT DETECTED Final   Rhinovirus / Enterovirus NOT DETECTED NOT DETECTED Final   Influenza A NOT DETECTED NOT DETECTED Final   Influenza B NOT DETECTED NOT DETECTED Final  Parainfluenza Virus 1 NOT DETECTED NOT DETECTED Final   Parainfluenza Virus 2 NOT DETECTED NOT DETECTED Final   Parainfluenza Virus 3 NOT DETECTED NOT DETECTED Final   Parainfluenza Virus 4 NOT DETECTED NOT DETECTED Final   Respiratory Syncytial Virus NOT DETECTED NOT DETECTED Final   Bordetella pertussis NOT DETECTED NOT DETECTED Final   Bordetella Parapertussis NOT DETECTED NOT DETECTED Final   Chlamydophila pneumoniae NOT DETECTED NOT DETECTED Final   Mycoplasma pneumoniae NOT DETECTED NOT DETECTED Final    Comment: Performed at [facility], [address] [place], [place] [address]    FURTHER DISCHARGE INSTRUCTIONS:  Get Medicines reviewed and adjusted: Please take all your medications with you for your next visit with your Primary MD  Laboratory/radiological data: Please request your Primary MD to go over all hospital tests and procedure/radiological results at the follow up, please ask your Primary MD to get all Hospital records sent to his/her office.  In some cases, they will be blood work, cultures and biopsy results pending at the time of your discharge. Please request that your primary care M.D. goes through all the records of your hospital data and follows up on these results.  Also Note the following: If you experience worsening of your admission symptoms, develop shortness of breath, life threatening emergency, suicidal or homicidal thoughts you must seek medical attention immediately by calling 911 or calling your MD immediately  if symptoms less severe.  You must read complete instructions/literature along with all the possible adverse reactions/side effects for all the Medicines you take and that have been prescribed to you. Take any new Medicines  after you have completely understood and accpet all the possible adverse reactions/side effects.   Do not drive when taking Pain medications or sleeping medications (Benzodaizepines)  Do not take more than prescribed Pain, Sleep and Anxiety Medications. It is not advisable to combine anxiety,sleep and pain medications without talking with your primary care practitioner  Special Instructions: If you have smoked or chewed Tobacco  in the last 2 yrs please stop smoking, stop any regular Alcohol  and or any Recreational drug use.  Wear Seat belts while driving.  Please note: You were cared for by a hospitalist during your hospital stay. Once you are discharged, your primary care physician will handle any further medical issues. Please note that NO REFILLS for any discharge medications will be authorized once you are discharged, as it is imperative that you return to your primary care physician (or establish a relationship with a primary care physician if you do not have one) for your post hospital discharge needs so that they can reassess your need for medications and monitor your lab values.  Total Time spent coordinating discharge including counseling, education and face to face time equals greater than 30 minutes.  SignedJeoffrey Massed 09/19/2022 8:45 AM

## 2022-09-19 NOTE — Plan of Care (Signed)

## 2022-09-24 NOTE — Telephone Encounter (Signed)
Pt. Gotten approved for medication PA team made separate encounter

## 2022-09-27 ENCOUNTER — Telehealth: Payer: Self-pay | Admitting: Cardiovascular Disease

## 2022-09-27 NOTE — Telephone Encounter (Signed)
   Pre-operative Risk Assessment    Patient Name: [name] Kim  DOB: 16-Apr-1951 MRN: [id number]      Request for Surgical Clearance    Procedure:  Colonoscopy with Propofol  Date of Surgery:  Clearance 10/17/22                                 Surgeon:  Dr. Iva Lento Group or Practice Name:  [facility] Phone number:  [phone] Fax number:  [phone]   Type of Clearance Requested:   - Medical    Type of Anesthesia:  Not Indicated   Additional requests/questions:    Signed, Narda Amber   09/27/2022, 4:03 PM

## 2022-09-27 NOTE — Telephone Encounter (Signed)
   Name: [name] Kim  DOB: December 11, 1950  MRN: [id number]  Primary Cardiologist: None   Preoperative team, please contact this patient and set up a phone call appointment for further preoperative risk assessment. Please obtain consent and complete medication review. Thank you for your help.  Recent hospital admission for COPD exacerbation 09/17/2022 to 09/19/2022.   I confirm that guidance regarding antiplatelet and oral anticoagulation therapy has been completed and, if necessary, noted below.  None requested.    Carlos Levering, NP 09/27/2022, 5:05 PM [facility]

## 2022-09-28 ENCOUNTER — Telehealth: Payer: Self-pay | Admitting: *Deleted

## 2022-09-28 NOTE — Telephone Encounter (Signed)
Pt has been scheduled for tele pre op appt 10/04/22 @ 3 pm. Med rec and consent are done.     Patient Consent for Virtual Visit        [name] Kim has provided verbal consent on 09/28/2022 for a virtual visit (video or telephone).   CONSENT FOR VIRTUAL VISIT FOR:  [name] Kim  By participating in this virtual visit I agree to the following:  I hereby voluntarily request, consent and authorize [facility] and its employed or contracted physicians, physician assistants, nurse practitioners or other licensed health care professionals (the Practitioner), to provide me with telemedicine health care services (the "Services") as deemed necessary by the treating Practitioner. I acknowledge and consent to receive the Services by the Practitioner via telemedicine. I understand that the telemedicine visit will involve communicating with the Practitioner through live audiovisual communication technology and the disclosure of certain medical information by electronic transmission. I acknowledge that I have been given the opportunity to request an in-person assessment or other available alternative prior to the telemedicine visit and am voluntarily participating in the telemedicine visit.  I understand that I have the right to withhold or withdraw my consent to the use of telemedicine in the course of my care at any time, without affecting my right to future care or treatment, and that the Practitioner or I may terminate the telemedicine visit at any time. I understand that I have the right to inspect all information obtained and/or recorded in the course of the telemedicine visit and may receive copies of available information for a reasonable fee.  I understand that some of the potential risks of receiving the Services via telemedicine include:  Delay or interruption in medical evaluation due to technological equipment failure or disruption; Information transmitted may not be sufficient (e.g.  poor resolution of images) to allow for appropriate medical decision making by the Practitioner; and/or  In rare instances, security protocols could fail, causing a breach of personal health information.  Furthermore, I acknowledge that it is my responsibility to provide information about my medical history, conditions and care that is complete and accurate to the best of my ability. I acknowledge that Practitioner's advice, recommendations, and/or decision may be based on factors not within their control, such as incomplete or inaccurate data provided by me or distortions of diagnostic images or specimens that may result from electronic transmissions. I understand that the practice of medicine is not an exact science and that Practitioner makes no warranties or guarantees regarding treatment outcomes. I acknowledge that a copy of this consent can be made available to me via my patient portal [facility] MyChart), or I can request a printed copy by calling the office of  [facility].    I understand that my insurance will be billed for this visit.   I have read or had this consent read to me. I understand the contents of this consent, which adequately explains the benefits and risks of the Services being provided via telemedicine.  I have been provided ample opportunity to ask questions regarding this consent and the Services and have had my questions answered to my satisfaction. I give my informed consent for the services to be provided through the use of telemedicine in my medical care

## 2022-09-28 NOTE — Telephone Encounter (Signed)
Pt has been scheduled for tele pre op appt 10/04/22 @ 3 pm. Med rec and consent are done.

## 2022-10-04 ENCOUNTER — Ambulatory Visit: Payer: Medicare HMO | Attending: Cardiovascular Disease | Admitting: Nurse Practitioner

## 2022-10-04 ENCOUNTER — Encounter: Payer: Self-pay | Admitting: Nurse Practitioner

## 2022-10-04 DIAGNOSIS — Z01810 Encounter for preprocedural cardiovascular examination: Secondary | ICD-10-CM | POA: Diagnosis not present

## 2022-10-04 NOTE — Progress Notes (Signed)
Virtual Visit via Telephone Note   Because of [name]'s co-morbid illnesses, he is at least at moderate risk for complications without adequate follow up.  This format is felt to be most appropriate for this patient at this time.  The patient did not have access to video technology/had technical difficulties with video requiring transitioning to audio format only (telephone).  All issues noted in this document were discussed and addressed.  No physical exam could be performed with this format.  Please refer to the patient's chart for his consent to telehealth for [facility].  Evaluation Performed:  Preoperative cardiovascular risk assessment _____________   Date:  10/04/2022   Patient ID:  [name] Kim, DOB [birth year], MRN [id number] Patient Location:  Home Provider location:   Office  Primary Care Provider:  Tracey Harries, MD Primary Cardiologist:  Julien Nordmann, MD  Chief Complaint / Patient Profile   72 y.o. y/o male with a h/o CAD s/p CABG, AAA, CKD stage III, tobacco abuse, chronic HIV infection on antiviral therapy, lung cancer, COPD, HLD, HTN, who is pending colonoscopy and presents today for telephonic preoperative cardiovascular risk assessment.  History of Present Illness    [name] Kim is a 72 y.o. male who presents via audio/video conferencing for a telehealth visit today.  Pt was last seen in cardiology clinic on 08/29/22 by Dr. Mariah Milling.  At that time [name] Kim was doing well.  He had hospital admission 09/17/22 for COPD exacerbation. The patient is now pending procedure as outlined above. Since his last visit, he  denies chest pain, shortness of breath, lower extremity edema, fatigue, palpitations, melena, hematuria, hemoptysis, diaphoresis, weakness, presyncope, syncope, orthopnea, and PND. He remains active at home and walks frequently for exercise without any concerning cardiac symptoms.   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   Past Surgical History:  Procedure Laterality Date   APPENDECTOMY  1970   BRONCHIAL BIOPSY  05/31/2021   Procedure: BRONCHIAL BIOPSIES;  Surgeon: Leslye Peer, MD;  Location: [facility];  Service: Pulmonary;;   BRONCHIAL BRUSHINGS  05/31/2021   Procedure: BRONCHIAL BRUSHINGS;  Surgeon: Leslye Peer, MD;  Location: [facility];  Service: Pulmonary;;   BRONCHIAL NEEDLE ASPIRATION BIOPSY  05/31/2021   Procedure: BRONCHIAL NEEDLE ASPIRATION BIOPSIES;  Surgeon: Leslye Peer, MD;  Location: [facility] ENDOSCOPY;  Service: Pulmonary;;   CATARACT EXTRACTION Bilateral    CATARACT EXTRACTION W/PHACO Right 02/03/2020   Procedure: CATARACT EXTRACTION PHACO AND INTRAOCULAR LENS PLACEMENT (IOC) RIGHT 4.30 00:28.6;  Surgeon: Galen Manila, MD;  Location: [facility];  Service: Ophthalmology;  Laterality: Right;   CATARACT EXTRACTION W/PHACO Left 02/24/2020   Procedure: CATARACT EXTRACTION PHACO AND INTRAOCULAR LENS PLACEMENT (IOC) LEFT 5.58 00:32.2;  Surgeon: Galen Manila, MD;  Location: [facility];  Service: Ophthalmology;  Laterality: Left;   CHOLECYSTECTOMY     COLONOSCOPY     CORONARY ARTERY BYPASS GRAFT  08/09/2005   LIMA-LAD, SVG-diagonal, SVG-OM   FIDUCIAL MARKER PLACEMENT  05/31/2021   Procedure: FIDUCIAL MARKER PLACEMENT;  Surgeon: Leslye Peer, MD;  Location: [facility] ENDOSCOPY;  Service: Pulmonary;;   TONSILECTOMY, ADENOIDECTOMY, BILATERAL MYRINGOTOMY AND TUBES  08/1964   VIDEO BRONCHOSCOPY WITH RADIAL ENDOBRONCHIAL ULTRASOUND   05/31/2021   Procedure: VIDEO BRONCHOSCOPY WITH RADIAL ENDOBRONCHIAL ULTRASOUND;  Surgeon: Leslye Peer, MD;  Location: [facility];  Service: Pulmonary;;    Allergies  Allergies  Allergen Reactions   Zidovudine Rash and Other (See Comments)    Caused bone marrow problems  2000     Home Medications    Prior to Admission medications   Medication Sig Start Date End Date Taking? Authorizing Provider  acetaminophen (TYLENOL) 500 MG tablet Take 250 mg by mouth every 6 (six) hours as needed (pain).    [provider]  albuterol (VENTOLIN HFA) 108 (90 Base) MCG/ACT inhaler INHALE 1 PUFF INTO THE LUNGS EVERY 6 HOURS AS NEEDED FOR WHEEZING OR SHORTNESS OF BREATH 04/03/22   Byrum, Les Pou, MD  ANORO ELLIPTA 62.5-25 MCG/ACT AEPB Inhale 1 puff into the lungs daily. 09/16/22   [provider]  aspirin 81 MG tablet Take 81 mg by mouth daily.    [provider]  atorvastatin (LIPITOR) 20 MG tablet TAKE 1 TABLET(20 MG) BY MOUTH DAILY AT 6 PM 09/12/22   Gollan, Tollie Pizza, MD  azithromycin (ZITHROMAX) 500 MG tablet Take 1 tablet (500 mg total) by mouth daily. 09/19/22   Ghimire, Werner Lean, MD  bictegravir-emtricitabine-tenofovir AF (BIKTARVY) 50-200-25 MG TABS tablet Take 1 tablet by mouth daily. 04/05/22   Cliffton Asters, MD  ezetimibe (ZETIA) 10 MG tablet TAKE 1 TABLET(10 MG) BY MOUTH DAILY 08/29/22   Antonieta Iba, MD  folic acid (FOLVITE) 400 MCG tablet Take 400 mcg by mouth in the morning.    [provider]  lamoTRIgine (LAMICTAL) 100 MG tablet Take 100 mg by mouth 2 (two) times daily.    [provider]  omega-3 acid ethyl esters (LOVAZA) 1 g capsule TAKE 1 CAPSULE BY MOUTH TWICE DAILY 08/15/22   Antonieta Iba, MD  predniSONE (DELTASONE) 10 MG tablet Take 4 tablets (40 mg) daily for 2 days, then, Take 3 tablets (30 mg) daily for 2 days, then, Take 2 tablets (20 mg) daily for 2 days, then, Take 1 tablets (10 mg) daily for 1 days, then stop 09/19/22    Maretta Bees, MD    Physical Exam    Vital Signs:  [name] Kim does not have vital signs available for review today.  Given telephonic nature of communication, physical exam is limited. AAOx3. NAD. Normal affect.  Speech and respirations are unlabored.  Accessory Clinical Findings    None  Assessment & Plan    1.  Preoperative Cardiovascular Risk Assessment: According to the Revised Cardiac Risk Index (RCRI), his Perioperative Risk of Major Cardiac Event is (%): 0.9. His Functional Capacity in METs is: 7.59 according to the Duke Activity Status Index (DASI). The patient is doing well from a cardiac perspective. Therefore, based on ACC/AHA guidelines, the patient would be at acceptable risk for the planned procedure without further cardiovascular testing.   The patient was advised that if he develops new symptoms prior to surgery to contact our office to arrange for a follow-up visit, and he verbalized understanding.  No cardiac medications requested to be held.   A copy of this note will be routed to requesting surgeon.  Time:   Today, I have spent 10 minutes with the patient with telehealth technology discussing medical history, symptoms, and management plan.    Levi Aland, NP-C  10/04/2022, 2:59 PM [address] Office [phone] Fax [phone]

## 2022-10-17 LAB — HM COLONOSCOPY

## 2022-10-26 ENCOUNTER — Other Ambulatory Visit: Payer: Self-pay | Admitting: Cardiovascular Disease

## 2022-10-27 ENCOUNTER — Other Ambulatory Visit: Payer: Self-pay | Admitting: Emergency Medicine

## 2022-10-30 ENCOUNTER — Telehealth: Payer: Self-pay | Admitting: Emergency Medicine

## 2022-10-30 NOTE — Telephone Encounter (Signed)
discard

## 2022-11-11 ENCOUNTER — Other Ambulatory Visit: Payer: Self-pay | Admitting: Cardiovascular Disease

## 2022-11-17 ENCOUNTER — Ambulatory Visit
Admission: RE | Admit: 2022-11-17 | Discharge: 2022-11-17 | Disposition: A | Discharge status: Home / Self Care | Payer: Medicare HMO | Source: Ambulatory Visit | Attending: Emergency Medicine | Admitting: Emergency Medicine

## 2022-11-17 DIAGNOSIS — C3492 Malignant neoplasm of unspecified part of left bronchus or lung: Secondary | ICD-10-CM | POA: Insufficient documentation

## 2022-11-20 ENCOUNTER — Other Ambulatory Visit: Payer: Self-pay

## 2022-11-20 ENCOUNTER — Encounter (HOSPITAL_COMMUNITY): Payer: Self-pay

## 2022-11-20 ENCOUNTER — Inpatient Hospital Stay (HOSPITAL_COMMUNITY)
Admission: EM | Admit: 2022-11-20 | Discharge: 2022-11-22 | DRG: 190 | Disposition: A | Discharge status: Home / Self Care | Payer: Medicare HMO | Attending: Internal Medicine | Admitting: Internal Medicine

## 2022-11-20 ENCOUNTER — Emergency Department (HOSPITAL_COMMUNITY): Payer: Medicare HMO

## 2022-11-20 DIAGNOSIS — Z951 Presence of aortocoronary bypass graft: Secondary | ICD-10-CM | POA: Diagnosis not present

## 2022-11-20 DIAGNOSIS — I714 Abdominal aortic aneurysm, without rupture, unspecified: Secondary | ICD-10-CM | POA: Diagnosis present

## 2022-11-20 DIAGNOSIS — G40909 Epilepsy, unspecified, not intractable, without status epilepticus: Secondary | ICD-10-CM | POA: Diagnosis present

## 2022-11-20 DIAGNOSIS — I2510 Atherosclerotic heart disease of native coronary artery without angina pectoris: Secondary | ICD-10-CM | POA: Diagnosis present

## 2022-11-20 DIAGNOSIS — Z7982 Long term (current) use of aspirin: Secondary | ICD-10-CM

## 2022-11-20 DIAGNOSIS — Z9841 Cataract extraction status, right eye: Secondary | ICD-10-CM | POA: Diagnosis not present

## 2022-11-20 DIAGNOSIS — J9611 Chronic respiratory failure with hypoxia: Secondary | ICD-10-CM | POA: Diagnosis present

## 2022-11-20 DIAGNOSIS — J9601 Acute respiratory failure with hypoxia: Secondary | ICD-10-CM | POA: Diagnosis present

## 2022-11-20 DIAGNOSIS — B20 Human immunodeficiency virus [HIV] disease: Secondary | ICD-10-CM | POA: Diagnosis present

## 2022-11-20 DIAGNOSIS — K219 Gastro-esophageal reflux disease without esophagitis: Secondary | ICD-10-CM | POA: Diagnosis present

## 2022-11-20 DIAGNOSIS — E7800 Pure hypercholesterolemia, unspecified: Secondary | ICD-10-CM | POA: Diagnosis present

## 2022-11-20 DIAGNOSIS — Z9842 Cataract extraction status, left eye: Secondary | ICD-10-CM | POA: Diagnosis not present

## 2022-11-20 DIAGNOSIS — Z961 Presence of intraocular lens: Secondary | ICD-10-CM | POA: Diagnosis present

## 2022-11-20 DIAGNOSIS — I25118 Atherosclerotic heart disease of native coronary artery with other forms of angina pectoris: Secondary | ICD-10-CM | POA: Diagnosis not present

## 2022-11-20 DIAGNOSIS — Z87891 Personal history of nicotine dependence: Secondary | ICD-10-CM | POA: Diagnosis not present

## 2022-11-20 DIAGNOSIS — J302 Other seasonal allergic rhinitis: Secondary | ICD-10-CM | POA: Diagnosis present

## 2022-11-20 DIAGNOSIS — Z1152 Encounter for screening for COVID-19: Secondary | ICD-10-CM | POA: Diagnosis not present

## 2022-11-20 DIAGNOSIS — Z888 Allergy status to other drugs, medicaments and biological substances status: Secondary | ICD-10-CM

## 2022-11-20 DIAGNOSIS — Z85118 Personal history of other malignant neoplasm of bronchus and lung: Secondary | ICD-10-CM | POA: Diagnosis not present

## 2022-11-20 DIAGNOSIS — Z79899 Other long term (current) drug therapy: Secondary | ICD-10-CM | POA: Diagnosis not present

## 2022-11-20 DIAGNOSIS — J441 Chronic obstructive pulmonary disease with (acute) exacerbation: Principal | ICD-10-CM | POA: Diagnosis present

## 2022-11-20 LAB — CBC WITH DIFFERENTIAL/PLATELET
Abs Immature Granulocytes: 0.03 10*3/uL (ref 0.00–0.07)
Basophils Absolute: 0.1 10*3/uL (ref 0.0–0.1)
Basophils Relative: 1 %
Eosinophils Absolute: 0.7 10*3/uL — ABNORMAL HIGH (ref 0.0–0.5)
Eosinophils Relative: 7 %
HCT: 47.4 % (ref 39.0–52.0)
Hemoglobin: 15.9 g/dL (ref 13.0–17.0)
Immature Granulocytes: 0 %
Lymphocytes Relative: 38 %
Lymphs Abs: 3.9 10*3/uL (ref 0.7–4.0)
MCH: 34.6 pg — ABNORMAL HIGH (ref 26.0–34.0)
MCHC: 33.5 g/dL (ref 30.0–36.0)
MCV: 103.3 fL — ABNORMAL HIGH (ref 80.0–100.0)
Monocytes Absolute: 1.5 10*3/uL — ABNORMAL HIGH (ref 0.1–1.0)
Monocytes Relative: 14 %
Neutro Abs: 4.2 10*3/uL (ref 1.7–7.7)
Neutrophils Relative %: 40 %
Platelets: 188 10*3/uL (ref 150–400)
RBC: 4.59 MIL/uL (ref 4.22–5.81)
RDW: 13 % (ref 11.5–15.5)
WBC: 10.5 10*3/uL (ref 4.0–10.5)
nRBC: 0 % (ref 0.0–0.2)

## 2022-11-20 LAB — I-STAT VENOUS BLOOD GAS, ED
Acid-Base Excess: 1 mmol/L (ref 0.0–2.0)
Bicarbonate: 27.8 mmol/L (ref 20.0–28.0)
Calcium, Ion: 1.19 mmol/L (ref 1.15–1.40)
HCT: 47 % (ref 39.0–52.0)
Hemoglobin: 16 g/dL (ref 13.0–17.0)
O2 Saturation: 69 %
Potassium: 4 mmol/L (ref 3.5–5.1)
Sodium: 139 mmol/L (ref 135–145)
TCO2: 29 mmol/L (ref 22–32)
pCO2, Ven: 52.7 mmHg (ref 44–60)
pH, Ven: 7.33 (ref 7.25–7.43)
pO2, Ven: 39 mmHg (ref 32–45)

## 2022-11-20 LAB — BASIC METABOLIC PANEL
Anion gap: 14 (ref 5–15)
BUN: 14 mg/dL (ref 8–23)
CO2: 24 mmol/L (ref 22–32)
Calcium: 9.2 mg/dL (ref 8.9–10.3)
Chloride: 101 mmol/L (ref 98–111)
Creatinine, Ser: 1.23 mg/dL (ref 0.61–1.24)
GFR, Estimated: >60 mL/min (ref >60)
Glucose, Bld: 126 mg/dL — ABNORMAL HIGH (ref 70–99)
Potassium: 3.9 mmol/L (ref 3.5–5.1)
Sodium: 139 mmol/L (ref 135–145)

## 2022-11-20 LAB — RESPIRATORY PANEL BY PCR

## 2022-11-20 LAB — SARS CORONAVIRUS 2 BY RT PCR: SARS Coronavirus 2 by RT PCR: NEGATIVE

## 2022-11-20 MED ORDER — PREDNISONE 20 MG PO TABS
40.0000 mg | ORAL_TABLET | Freq: Every day | ORAL | Status: DC
  Administered 2022-11-21 – 2022-11-22 (×2): 40 mg via ORAL
  Filled 2022-11-20 (×2): qty 2

## 2022-11-20 MED ORDER — BICTEGRAVIR-EMTRICITAB-TENOFOV 50-200-25 MG PO TABS
1.0000 | ORAL_TABLET | Freq: Every day | ORAL | Status: DC
  Administered 2022-11-20 – 2022-11-22 (×3): 1 via ORAL
  Filled 2022-11-20 (×4): qty 1

## 2022-11-20 MED ORDER — FOLIC ACID 1 MG PO TABS
500.0000 ug | ORAL_TABLET | Freq: Every day | ORAL | Status: DC
  Administered 2022-11-20 – 2022-11-22 (×3): 0.5 mg via ORAL
  Filled 2022-11-20 (×3): qty 1

## 2022-11-20 MED ORDER — ENOXAPARIN SODIUM 40 MG/0.4ML IJ SOSY: 40.0000 mg | PREFILLED_SYRINGE | INTRAMUSCULAR | Status: DC

## 2022-11-20 MED ORDER — ALBUTEROL SULFATE (2.5 MG/3ML) 0.083% IN NEBU
INHALATION_SOLUTION | RESPIRATORY_TRACT | Status: AC
  Filled 2022-11-20: qty 3

## 2022-11-20 MED ORDER — EZETIMIBE 10 MG PO TABS
10.0000 mg | ORAL_TABLET | Freq: Every day | ORAL | Status: DC
  Administered 2022-11-20 – 2022-11-22 (×3): 10 mg via ORAL
  Filled 2022-11-20 (×3): qty 1

## 2022-11-20 MED ORDER — ALBUTEROL SULFATE (2.5 MG/3ML) 0.083% IN NEBU
2.5000 mg | INHALATION_SOLUTION | Freq: Two times a day (BID) | RESPIRATORY_TRACT | Status: DC
  Administered 2022-11-20 – 2022-11-22 (×4): 2.5 mg via RESPIRATORY_TRACT
  Filled 2022-11-20 (×4): qty 3

## 2022-11-20 MED ORDER — AZITHROMYCIN 250 MG PO TABS: 500.0000 mg | ORAL_TABLET | Freq: Every day | ORAL | Status: DC

## 2022-11-20 MED ORDER — GUAIFENESIN ER 600 MG PO TB12
600.0000 mg | ORAL_TABLET | Freq: Two times a day (BID) | ORAL | Status: DC
  Administered 2022-11-20 – 2022-11-22 (×5): 600 mg via ORAL
  Filled 2022-11-20 (×5): qty 1

## 2022-11-20 MED ORDER — ACETAMINOPHEN 650 MG RE SUPP: 650.0000 mg | Freq: Four times a day (QID) | RECTAL | Status: DC | PRN

## 2022-11-20 MED ORDER — ONDANSETRON HCL 4 MG PO TABS: 4.0000 mg | ORAL_TABLET | Freq: Four times a day (QID) | ORAL | Status: DC | PRN

## 2022-11-20 MED ORDER — OMEGA-3-ACID ETHYL ESTERS 1 G PO CAPS
1.0000 | ORAL_CAPSULE | Freq: Two times a day (BID) | ORAL | Status: DC
  Administered 2022-11-20 – 2022-11-22 (×5): 1 g via ORAL
  Filled 2022-11-20 (×6): qty 1

## 2022-11-20 MED ORDER — ATORVASTATIN CALCIUM 10 MG PO TABS
20.0000 mg | ORAL_TABLET | Freq: Every day | ORAL | Status: DC
  Administered 2022-11-20 – 2022-11-22 (×3): 20 mg via ORAL
  Filled 2022-11-20 (×3): qty 2

## 2022-11-20 MED ORDER — ALBUTEROL SULFATE (2.5 MG/3ML) 0.083% IN NEBU
INHALATION_SOLUTION | RESPIRATORY_TRACT | Status: AC
  Administered 2022-11-20: 10 mg
  Filled 2022-11-20: qty 12

## 2022-11-20 MED ORDER — ENOXAPARIN SODIUM 40 MG/0.4ML IJ SOSY
40.0000 mg | PREFILLED_SYRINGE | INTRAMUSCULAR | Status: DC
  Administered 2022-11-20 – 2022-11-22 (×3): 40 mg via SUBCUTANEOUS
  Filled 2022-11-20 (×3): qty 0.4

## 2022-11-20 MED ORDER — LAMOTRIGINE 100 MG PO TABS
100.0000 mg | ORAL_TABLET | Freq: Two times a day (BID) | ORAL | Status: DC
  Administered 2022-11-20 – 2022-11-22 (×5): 100 mg via ORAL
  Filled 2022-11-20 (×5): qty 1

## 2022-11-20 MED ORDER — UMECLIDINIUM-VILANTEROL 62.5-25 MCG/ACT IN AEPB
1.0000 | INHALATION_SPRAY | Freq: Every day | RESPIRATORY_TRACT | Status: DC
  Administered 2022-11-20 – 2022-11-22 (×3): 1 via RESPIRATORY_TRACT
  Filled 2022-11-20: qty 14

## 2022-11-20 MED ORDER — SODIUM CHLORIDE 0.9% FLUSH
3.0000 mL | Freq: Two times a day (BID) | INTRAVENOUS | Status: DC
  Administered 2022-11-20 – 2022-11-22 (×5): 3 mL via INTRAVENOUS

## 2022-11-20 MED ORDER — ONDANSETRON HCL 4 MG/2ML IJ SOLN: 4.0000 mg | Freq: Four times a day (QID) | INTRAMUSCULAR | Status: DC | PRN

## 2022-11-20 MED ORDER — ALBUTEROL SULFATE (2.5 MG/3ML) 0.083% IN NEBU: 10.0000 mg | INHALATION_SOLUTION | RESPIRATORY_TRACT | Status: DC

## 2022-11-20 MED ORDER — ASPIRIN 81 MG PO TBEC
81.0000 mg | DELAYED_RELEASE_TABLET | Freq: Every day | ORAL | Status: DC
  Administered 2022-11-20 – 2022-11-22 (×3): 81 mg via ORAL
  Filled 2022-11-20 (×3): qty 1

## 2022-11-20 MED ORDER — ALBUTEROL SULFATE (2.5 MG/3ML) 0.083% IN NEBU: 2.5000 mg | INHALATION_SOLUTION | RESPIRATORY_TRACT | Status: DC | PRN

## 2022-11-20 MED ORDER — LORATADINE 10 MG PO TABS
10.0000 mg | ORAL_TABLET | Freq: Every day | ORAL | Status: DC
  Administered 2022-11-20 – 2022-11-22 (×3): 10 mg via ORAL
  Filled 2022-11-20 (×3): qty 1

## 2022-11-20 MED ORDER — AZITHROMYCIN 250 MG PO TABS
500.0000 mg | ORAL_TABLET | Freq: Every day | ORAL | Status: DC
  Administered 2022-11-20 – 2022-11-22 (×3): 500 mg via ORAL
  Filled 2022-11-20 (×3): qty 2

## 2022-11-20 MED ORDER — ACETAMINOPHEN 325 MG PO TABS: 650.0000 mg | ORAL_TABLET | Freq: Four times a day (QID) | ORAL | Status: DC | PRN

## 2022-11-20 MED ORDER — ORAL CARE MOUTH RINSE: 15.0000 mL | OROMUCOSAL | Status: DC | PRN

## 2022-11-20 MED ORDER — SODIUM CHLORIDE 0.9 % IV SOLN
1.0000 g | INTRAVENOUS | Status: DC
  Administered 2022-11-20 – 2022-11-22 (×3): 1 g via INTRAVENOUS
  Filled 2022-11-20 (×3): qty 10

## 2022-11-20 MED ORDER — ALBUTEROL SULFATE (2.5 MG/3ML) 0.083% IN NEBU
2.5000 mg | INHALATION_SOLUTION | Freq: Four times a day (QID) | RESPIRATORY_TRACT | Status: DC
  Administered 2022-11-20: 2.5 mg via RESPIRATORY_TRACT

## 2022-11-20 NOTE — H&P (Signed)
History and Physical    Patient: [name] Kim WJX:[id number] DOB: 1951-05-19 DOA: 11/20/2022 DOS: the patient was seen and examined on 11/20/2022 PCP: Tracey Harries, MD  Patient coming from: Home  Chief Complaint:  Chief Complaint  Patient presents with   Respiratory Distress   HPI: [name] Kim is a 72 y.o. 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  Yesterday he noted that his breathing was off, but had not needed to use the oxygen.  However, this morning when he got up to use the bathroom he reported being acutely unable to catch his breath.  Noted associated symptoms of diaphoresis and wheezing.  He tried using his rescue inhaler 3-4 times without any relief.   Patient is normally active doing work outside the house including picking up stone statues although he does not report being outside much yesterday.  He also has oxygen available to him as needed since back in 2022 when he had the malignant lung nodule resected. Patient [place] [facility] [place] for which they are always spraying something on the field near his house. Patient does admit to having allergies with sinus congestion and drainage at baseline.    He normally has to clear his throat in the morning, but has not had any significant cough.  Denies having any recent fever, chest pain, chest tightness, nausea, vomiting, diarrhea, abdominal pain,dysuria, or sick contacts.  He has been compliant with all his medications.  Patient makes note that he also recently had a CT scan done on 6/28 ordered by Dr. Delton Coombes.  En route with EMS patient was given epinephrine 0.3 mg IM, Solu-Medrol 125 mg IV, magnesium sulfate 2 g, and 2 DuoNeb breathing treatments.  Patient was noted to be tachypneic with  respirations 45-50, blood pressure 167/76, and O2 saturations noted to be 95% on 3 L.  In the emergency department patient was noted to be afebrile with respirations elevated up to 28, blood pressures 118/64 to 175/86, and O2 saturations currently maintained on 4 L of nasal cannula oxygen.  Patient had been temporarily placed on BiPAP.  Labs noted WBC 10.5, elevated MCV/MCH, monocyte count 1.5, and eosinophil count 0.7.  Venous blood gas noted pH 7.33, pCO2 52.7, and pO2 39.  Chest x-ray showed no acute abnormality.  Complete respiratory virus panel and COVID-19 screening were negative.  Review of Systems: As mentioned in the history of present illness. All other systems reviewed and are negative. 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   Past Surgical History:  Procedure Laterality Date   APPENDECTOMY  1970   BRONCHIAL BIOPSY  05/31/2021   Procedure: BRONCHIAL BIOPSIES;  Surgeon: Leslye Peer, MD;  Location[facility] END[facility]COPY;  Service: Pulmonary;;   BRONCHIAL BRUSHINGS  05/31/2021   Procedure: BRONCHIAL  BRUSHINGS;  Surgeon: Leslye Peer, MD;  Location: [facility] ENDOSCOPY;  Service: Pulmonary;;   BRONCHIAL NEEDLE ASPIRATION BIOPSY  05/31/2021   Procedure: BRONCHIAL NEEDLE ASPIRATION BIOPSIES;  Surgeon: Leslye Peer, MD;  Location: [facility] ENDOSCOPY;  Service: Pulmonary;;   CATARACT EXTRACTION Bilateral    CATARACT EXTRACTION W/PHACO Right 02/03/2020   Procedure: CATARACT EXTRACTION PHACO AND INTRAOCULAR LENS PLACEMENT (IOC) RIGHT 4.30 00:28.6;  Surgeon: Galen Manila, MD;   Location: [facility];  Service: Ophthalmology;  Laterality: Right;   CATARACT EXTRACTION W/PHACO Left 02/24/2020   Procedure: CATARACT EXTRACTION PHACO AND INTRAOCULAR LENS PLACEMENT (IOC) LEFT 5.58 00:32.2;  Surgeon: Galen Manila, MD;  Location: [facility];  Service: Ophthalmology;  Laterality: Left;   CHOLECYSTECTOMY     COLONOSCOPY     CORONARY ARTERY BYPASS GRAFT  08/09/2005   LIMA-LAD, SVG-diagonal, SVG-OM   FIDUCIAL MARKER PLACEMENT  05/31/2021   Procedure: FIDUCIAL MARKER PLACEMENT;  Surgeon: Leslye Peer, MD;  Location: [facility] ENDOSCOPY;  Service: Pulmonary;;   TONSILECTOMY, ADENOIDECTOMY, BILATERAL MYRINGOTOMY AND TUBES  08/1964   VIDEO BRONCHOSCOPY WITH RADIAL ENDOBRONCHIAL ULTRASOUND  05/31/2021   Procedure: VIDEO BRONCHOSCOPY WITH RADIAL ENDOBRONCHIAL ULTRASOUND;  Surgeon: Leslye Peer, MD;  Location: [facility];  Service: Pulmonary;;   Social History:  reports that he quit smoking about 17 years ago. His smoking use included cigarettes. He smoked an average of .5 packs per day. He has never used smokeless tobacco. He reports current alcohol use. He reports that he does not use drugs.  Allergies  Allergen Reactions   Zidovudine Rash and Other (See Comments)    Caused bone marrow problems  2000     Family History  Problem Relation Age of Onset   Cancer Mother    Cancer Father        leukemia    Prior to Admission medications   Medication Sig Start Date End Date Taking? Authorizing Provider  acetaminophen (TYLENOL) 500 MG tablet Take 250 mg by mouth every 6 (six) hours as needed (pain).    [provider]  albuterol (VENTOLIN HFA) 108 (90 Base) MCG/ACT inhaler INHALE 1 PUFF INTO THE LUNGS EVERY 6 HOURS AS NEEDED FOR WHEEZING OR SHORTNESS OF BREATH 10/27/22   Leslye Peer, MD  ANORO ELLIPTA 62.5-25 MCG/ACT AEPB Inhale 1 puff into the lungs daily. 09/16/22   [provider]  aspirin 81 MG tablet Take 81 mg by mouth daily.     [provider]  atorvastatin (LIPITOR) 20 MG tablet TAKE 1 TABLET(20 MG) BY MOUTH DAILY AT 6 PM 09/12/22   Gollan, Tollie Pizza, MD  azithromycin (ZITHROMAX) 500 MG tablet Take 1 tablet (500 mg total) by mouth daily. 09/19/22   Ghimire, Werner Lean, MD  bictegravir-emtricitabine-tenofovir AF (BIKTARVY) 50-200-25 MG TABS tablet Take 1 tablet by mouth daily. 04/05/22   Cliffton Asters, MD  ezetimibe (ZETIA) 10 MG tablet TAKE 1 TABLET(10 MG) BY MOUTH DAILY 08/29/22   Antonieta Iba, MD  folic acid (FOLVITE) 400 MCG tablet Take 400 mcg by mouth in the morning.    [provider]  lamoTRIgine (LAMICTAL) 100 MG tablet Take 100 mg by mouth 2 (two) times daily.    [provider]  omega-3 acid ethyl esters (LOVAZA) 1 g capsule TAKE 1 CAPSULE BY MOUTH TWICE DAILY 11/13/22   Antonieta Iba, MD  predniSONE (DELTASONE) 10 MG tablet Take 4 tablets (40 mg) daily for 2 days, then, Take 3 tablets (30 mg) daily for 2 days, then,  Take 2 tablets (20 mg) daily for 2 days, then, Take 1 tablets (10 mg) daily for 1 days, then stop 09/19/22   Maretta Bees, MD    Physical Exam: Vitals:   11/20/22 0500 11/20/22 0515 11/20/22 0615 11/20/22 0654  BP: 118/64 (!) 140/62 120/70 138/65  Pulse: 74 71 65 70  Resp: 18 20 11    Temp:   97.9 F (36.6 C) 97.9 F (36.6 C)  TempSrc:   Oral Oral  SpO2: 91% 91% 92% 91%  Weight:    60.1 kg  Height:    5\' 6"  (1.676 m)   Constitutional: Older adult male who appears to be in no acute distress at this time. Eyes: PERRL, lids and conjunctivae normal ENMT: Mucous membranes are moist.  .Normal dentition.  Neck: normal, supple,   Respiratory: Decreased overall aeration with expiratory wheezes appreciated.  Patient on 3 L nasal cannula oxygen with O2 saturations maintained. Cardiovascular: Regular rate and rhythm, no murmurs / rubs / gallops. No extremity edema. 1+ pedal pulses. Abdomen: no tenderness, no masses palpated. Bowel sounds positive.   Musculoskeletal: no clubbing / cyanosis. No joint deformity upper and lower extremities. Good ROM, no contractures. Normal muscle tone.  Skin: no rashes, lesions, ulcers. No induration Neurologic: CN 2-12 grossly intact.  Strength 5/5 in all 4.  Psychiatric: Normal judgment and insight. Alert and oriented x 3. Normal mood.   Data Reviewed:  EKG revealed sinus rhythm 81 bpm.  Reviewed labs, imaging, and pertinent records as noted above in HPI. Assessment and Plan:  Acute respiratory failure secondary to COPD exacerbation Patient presented with acute onset of shortness of breath.  Tried using his rescue inhaler without improvement in symptoms.  On physical exam decreased aeration with expiratory wheezes appreciated.  Temporarily placed on BiPAP due to work of breathing and transition to 3 L of oxygen.  Normally does not have to use home oxygen..  Chest x-ray without acute abnormality appreciated.  Patient has been given Solu-Medrol 125 mg IV, magnesium sulfate 2 g IV, and multiple breathing treatments.  Last hospitalization 08/2022 for COPD exacerbation -Admit to a telemetry bed -Continuous pulse oximetry with oxygen to maintain O2 saturations greater than 90% -Incentive spirometry and flutter valve -Check sputum culture -Albuterol nebs 4 times daily and every 2 hours as needed -Continue Anoro Ellipta -Empiric antibiotics of Rocephin -Claritin -PT to evaluate  HIV Patient reports compliance with antiviral medications.  Last CD4 count 560 and HIV viral load undetectable on 03/22/2022. -Continue Biktarvy  History of malignant pulmonary nodule Status post resection back in 2022.  Patient had recent follow-up CT scan of the chest done on 6/28. -Called radiology to see if CT scan can be formally read   CAD Patient with prior CABG in 2007.  Has any complaints of chest pain. -Continue aspirin, statin, Zetia  Seizure disorder -Continue Lamictal  AAA Last ultrasound of the aorta noted  small aneurysm of the infrarenal abdominal aorta measuring 3.6 cm back in 2021. -Patient in need of repeat aorta ultrasound for monitoring of AAA as recommended every 2 years  History of tobacco abuse Patient quit smoking several years ago.  DVT prophylaxis: Lovenox Advance Care Planning:   Code Status: Full Code   Consults: None  Family Communication: None requested when asked.  Severity of Illness: The appropriate patient status for this patient is INPATIENT. Inpatient status is judged to be reasonable and necessary in order to provide the required intensity of service to ensure the patient's safety. The  patient's presenting symptoms, physical exam findings, and initial radiographic and laboratory data in the context of their chronic comorbidities is felt to place them at high risk for further clinical deterioration. Furthermore, it is not anticipated that the patient will be medically stable for discharge from the hospital within 2 midnights of admission.   * I certify that at the point of admission it is my clinical judgment that the patient will require inpatient hospital care spanning beyond 2 midnights from the point of admission due to high intensity of service, high risk for further deterioration and high frequency of surveillance required.*  Author: Clydie Braun, MD 11/20/2022 7:41 AM  For on call review [email or web address].

## 2022-11-20 NOTE — ED Provider Notes (Signed)
[facility] EMERGENCY DEPARTMENT AT [facility] Provider Note   CSN: [id number] Arrival date & time: 11/20/22  7564     History  Chief Complaint  Patient presents with   Respiratory Distress    [name] Kim is a 72 y.o. male.  Patient presents to the emergency department for evaluation of shortness of breath.  He reports that he woke up from sleep, went to the bathroom and then became very short of breath.  EMS reports that the patient was in severe respiratory distress, exhibiting tripod breathing upon their arrival.  He is on supplemental oxygen, has not been giving 0.3 mg of IM epi, 125 mg of IV Solu-Medrol, 2 g of magnesium and has just finished 2 DuoNebs during transport.  He reports some improvement.  Patient reports a history of COPD.       Home Medications Prior to Admission medications   Medication Sig Start Date End Date Taking? Authorizing Provider  acetaminophen (TYLENOL) 500 MG tablet Take 250 mg by mouth every 6 (six) hours as needed (pain).    [provider]  albuterol (VENTOLIN HFA) 108 (90 Base) MCG/ACT inhaler INHALE 1 PUFF INTO THE LUNGS EVERY 6 HOURS AS NEEDED FOR WHEEZING OR SHORTNESS OF BREATH 10/27/22   Leslye Peer, MD  ANORO ELLIPTA 62.5-25 MCG/ACT AEPB Inhale 1 puff into the lungs daily. 09/16/22   [provider]  aspirin 81 MG tablet Take 81 mg by mouth daily.    [provider]  atorvastatin (LIPITOR) 20 MG tablet TAKE 1 TABLET(20 MG) BY MOUTH DAILY AT 6 PM 09/12/22   Gollan, Tollie Pizza, MD  azithromycin (ZITHROMAX) 500 MG tablet Take 1 tablet (500 mg total) by mouth daily. 09/19/22   Ghimire, Werner Lean, MD  bictegravir-emtricitabine-tenofovir AF (BIKTARVY) 50-200-25 MG TABS tablet Take 1 tablet by mouth daily. 04/05/22   Cliffton Asters, MD  ezetimibe (ZETIA) 10 MG tablet TAKE 1 TABLET(10 MG) BY MOUTH DAILY 08/29/22   Antonieta Iba, MD  folic acid (FOLVITE) 400 MCG tablet Take 400 mcg by mouth in the morning.     [provider]  lamoTRIgine (LAMICTAL) 100 MG tablet Take 100 mg by mouth 2 (two) times daily.    [provider]  omega-3 acid ethyl esters (LOVAZA) 1 g capsule TAKE 1 CAPSULE BY MOUTH TWICE DAILY 11/13/22   Antonieta Iba, MD  predniSONE (DELTASONE) 10 MG tablet Take 4 tablets (40 mg) daily for 2 days, then, Take 3 tablets (30 mg) daily for 2 days, then, Take 2 tablets (20 mg) daily for 2 days, then, Take 1 tablets (10 mg) daily for 1 days, then stop 09/19/22   Maretta Bees, MD      Allergies    Zidovudine    Review of Systems   Review of Systems  Physical Exam Updated Vital Signs BP 136/70   Pulse 70   Temp (!) 97.2 F (36.2 C) (Oral)   Resp 18   Ht 5\' 5"  (1.651 m)   Wt 63.5 kg   SpO2 97%   BMI 23.30 kg/m  Physical Exam Vitals and nursing note reviewed.  Constitutional:      General: He is in acute distress.     Appearance: He is well-developed.  HENT:     Head: Normocephalic and atraumatic.     Mouth/Throat:     Mouth: Mucous membranes are moist.  Eyes:     General: Vision grossly intact. Gaze aligned appropriately.  Extraocular Movements: Extraocular movements intact.     Conjunctiva/sclera: Conjunctivae normal.  Cardiovascular:     Rate and Rhythm: Normal rate and regular rhythm.     Pulses: Normal pulses.     Heart sounds: Normal heart sounds, S1 normal and S2 normal. No murmur heard.    No friction rub. No gallop.  Pulmonary:     Effort: Tachypnea, accessory muscle usage, prolonged expiration and respiratory distress present.     Breath sounds: Decreased air movement present. Decreased breath sounds and wheezing present.  Abdominal:     Palpations: Abdomen is soft.     Tenderness: There is no abdominal tenderness. There is no guarding or rebound.     Hernia: No hernia is present.  Musculoskeletal:        General: No swelling.     Cervical back: Full passive range of motion without pain, normal range of motion and neck supple. No  pain with movement, spinous process tenderness or muscular tenderness. Normal range of motion.     Right lower leg: No edema.     Left lower leg: No edema.  Skin:    General: Skin is warm and dry.     Capillary Refill: Capillary refill takes less than 2 seconds.     Findings: No ecchymosis, erythema, lesion or wound.  Neurological:     Mental Status: He is alert and oriented to person, place, and time.     GCS: GCS eye subscore is 4. GCS verbal subscore is 5. GCS motor subscore is 6.     Cranial Nerves: Cranial nerves 2-12 are intact.     Sensory: Sensation is intact.     Motor: Motor function is intact. No weakness or abnormal muscle tone.     Coordination: Coordination is intact.  Psychiatric:        Mood and Affect: Mood normal.        Speech: Speech normal.        Behavior: Behavior normal.     ED Results / Procedures / Treatments   Labs (all labs ordered are listed, but only abnormal results are displayed) Labs Reviewed  CBC WITH DIFFERENTIAL/PLATELET - Abnormal; Notable for the following components:      Result Value   MCV 103.3 (*)    MCH 34.6 (*)    Monocytes Absolute 1.5 (*)    Eosinophils Absolute 0.7 (*)    All other components within normal limits  BASIC METABOLIC PANEL - Abnormal; Notable for the following components:   Glucose, Bld 126 (*)    All other components within normal limits  I-STAT VENOUS BLOOD GAS, ED    EKG EKG Interpretation Date/Time:  Monday November 20 2022 03:22:51 EDT Ventricular Rate:  81 PR Interval:  156 QRS Duration:  100 QT Interval:  375 QTC Calculation: 436 R Axis:   68  Text Interpretation: Sinus rhythm Confirmed by Gilda Crease [phone]) on 11/20/2022 3:36:13 AM  Radiology DG Chest Port 1 View  Result Date: 11/20/2022 CLINICAL DATA:  Shortness of breath EXAM: PORTABLE CHEST 1 VIEW COMPARISON:  CT chest dated 11/17/2022 FINDINGS: Lungs are essentially clear.  No pleural effusion or pneumothorax. The heart is normal in size.  Postsurgical changes related to prior CABG. Median sternotomy. IMPRESSION: No acute cardiopulmonary disease. Electronically Signed   By: Charline Bills M.D.   On: 11/20/2022 03:50    Procedures Procedures    Medications Ordered in ED Medications  albuterol (PROVENTIL) (2.5 MG/3ML) 0.083% nebulizer solution 10 mg (10  mg Nebulization Not Given 11/20/22 0328)  albuterol (PROVENTIL) (2.5 MG/3ML) 0.083% nebulizer solution (10 mg  Given 11/20/22 1610)    ED Course/ Medical Decision Making/ A&P                             Medical Decision Making Amount and/or Complexity of Data Reviewed External Data Reviewed: labs, radiology, ECG and notes. Labs: ordered. Decision-making details documented in ED Course. Radiology: ordered and independent interpretation performed. Decision-making details documented in ED Course. ECG/medicine tests: ordered and independent interpretation performed. Decision-making details documented in ED Course.  Risk Prescription drug management.   Differential Diagnosis considered includes, but not limited to: COPD exacerbation; Bronchitis; Pneumonia; CHF; ACS; PE  Presents to the urgency department for evaluation of shortness of breath.  Patient in severe respiratory distress initially.  EMS initiated treatment for patient and he was moving air better at arrival but still in distress.  This has further improved with continuous albuterol.  X-ray does not show any evidence of congestive heart failure or pneumonia.  Patient feeling improvement, now able to talk in sentences.  Weaned to nasal cannula oxygen.  CRITICAL CARE Performed by: Gilda Crease   Total critical care time: 30 minutes  Critical care time was exclusive of separately billable procedures and treating other patients.  Critical care was necessary to treat or prevent imminent or life-threatening deterioration.  Critical care was time spent personally by me on the following activities:  development of treatment plan with patient and/or surrogate as well as nursing, discussions with consultants, evaluation of patient's response to treatment, examination of patient, obtaining history from patient or surrogate, ordering and performing treatments and interventions, ordering and review of laboratory studies, ordering and review of radiographic studies, pulse oximetry and re-evaluation of patient's condition.         Final Clinical Impression(s) / ED Diagnoses Final diagnoses:  COPD exacerbation [facility])    Rx / DC Orders ED Discharge Orders     None         Hetal Proano, Canary Brim, MD 11/20/22 [id number]

## 2022-11-20 NOTE — Plan of Care (Signed)

## 2022-11-20 NOTE — ED Triage Notes (Signed)
Arrives EMS from home after waking up and walking to the restroom and suddenly develops respiratory distress near respiratory failure on paramedic arrival.   Used rescue inhaler with no improvement.   EMS administer: 0.3mg  IM epinephrine 125mg  Solumedrol  2g Magnesium over 10 minutes  2 duonebs  VS pta. : 167/76bp 95% 3L [place] (that he sometimes wears) new rx 45-50rr

## 2022-11-20 NOTE — Progress Notes (Signed)
SATURATION QUALIFICATIONS: (This note is used to comply with regulatory documentation for home oxygen)  Patient Saturations on Room Air at Rest = 90%  Patient Saturations on Room Air while Ambulating = 86%  Patient Saturations on 4 Liters of oxygen while Ambulating = 89%  Please briefly explain why patient needs home oxygen: Patient has home oxygen prn, educated to check his oxygen saturations more frequently at home. Patient has a pulse oximeter at home. Elnita Maxwell, RN

## 2022-11-20 NOTE — TOC Initial Note (Signed)
Transition of Care [facility]) - Initial/Assessment Note    Patient Details  Name: [name] Kim MRN: [id number] Date of Birth: [birth year]  Transition [facility]) CM/SW Contact:    Leone Haven, RN Phone Number: 11/20/2022, 6:12 PM  Clinical Narrative:                 From home with partner, he has PCP and insurance on file, he currently does not have any HH services in place at this time. He states he does have oxygen concentrator at home that does not work well thru [employer], he states he does not use it all the time.  He states his partner, Gabriel Rung, will transport him home at dc.  Gabriel Rung is his support system.  He gets his medications from [facility] on [address] in [place].  Pta self ambulatory.  Expected Discharge Plan: Home/Self Care Barriers to Discharge: Continued Medical Work up   Patient Goals and CMS Choice Patient states their goals for this hospitalization and ongoing recovery are:: return homw   Choice offered to / list presented to : NA      Expected Discharge Plan and Services In-house Referral: NA Discharge Planning Services: CM Consult Post Acute Care Choice: NA Living arrangements for the past 2 months: Single Family Home                 DME Arranged: N/A DME Agency: NA       HH Arranged: NA          Prior Living Arrangements/Services Living arrangements for the past 2 months: Single Family Home Lives with:: Significant Other Patient language and need for interpreter reviewed:: Yes Do you feel safe going back to the place where you live?: Yes      Need for Family Participation in Patient Care: Yes (Comment) Care giver support system in place?: Yes (comment) Current home services: DME (oxygen concentrator) Criminal Activity/Legal Involvement Pertinent to Current Situation/Hospitalization: No - Comment as needed  Activities of Daily Living Home Assistive Devices/Equipment: None ADL Screening (condition at time of admission) Patient's cognitive  ability adequate to safely complete daily activities?: Yes Is the patient deaf or have difficulty hearing?: No Does the patient have difficulty seeing, even when wearing glasses/contacts?: No Does the patient have difficulty concentrating, remembering, or making decisions?: No Patient able to express need for assistance with ADLs?: Yes Does the patient have difficulty dressing or bathing?: No Independently performs ADLs?: Yes (appropriate for developmental age) Does the patient have difficulty walking or climbing stairs?: No Weakness of Legs: None Weakness of Arms/Hands: None  Permission Sought/Granted Permission sought to share information with : Case Manager                Emotional Assessment Appearance:: Appears stated age Attitude/Demeanor/Rapport: Engaged Affect (typically observed): Appropriate Orientation: : Oriented to Self, Oriented to Place, Oriented to  Time, Oriented to Situation Alcohol / Substance Use: Not Applicable Psych Involvement: No (comment)  Admission diagnosis:  COPD exacerbation (HCC) [J44.1] COPD with acute exacerbation (HCC) [J44.1] Patient Active Problem List   Diagnosis Date Noted   COPD with acute exacerbation (HCC) 11/20/2022   Acute respiratory failure with hypoxia (HCC) 09/17/2022   Acute bronchitis with chronic obstructive pulmonary disease (COPD) (HCC) 09/17/2022   Leukocytosis 09/17/2022   CKD (chronic kidney disease), stage III (HCC) 09/17/2022   History of lung cancer 09/17/2022   Squamous cell lung cancer, 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   COPD (chronic obstructive pulmonary disease) (HCC) 01/05/2021  Tubular adenoma of colon 11/13/2018   Abdominal aortic aneurysm (AAA) without rupture (HCC) 01/17/2018   Macrocytosis without anemia 08/07/2017   History of tobacco abuse 12/18/2016   Seasonal allergies 10/26/2016   Colon polyposis 03/01/2015   Loss of peripheral visual field, 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   Seasonal allergic rhinitis due to pollen  03/01/2015   Parasomnia 09/09/2014   Sleep behavior disorder, 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   Chronic renal insufficiency 10/31/2012   Other malaise and fatigue 10/29/2012   Weight loss, unintentional 10/29/2012   Dyslipidemia 02/08/2012   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 [place] [address] Phone: [phone] Fax: [phone]  [facility] [address] [place] [address] Phone: [phone] Fax: [phone]     Social Determinants of Health (SDOH) Social History: SDOH Screenings   Food Insecurity: No Food Insecurity (11/20/2022)  Housing: Low Risk  (11/20/2022)  Transportation Needs: No Transportation Needs (11/20/2022)  Utilities: Not At Risk (11/20/2022)  Depression (PHQ2-9): Low Risk  (04/05/2022)  Tobacco Use: Medium Risk (11/20/2022)   SDOH Interventions:     Readmission Risk Interventions     No data to display

## 2022-11-21 DIAGNOSIS — J441 Chronic obstructive pulmonary disease with (acute) exacerbation: Secondary | ICD-10-CM | POA: Diagnosis not present

## 2022-11-21 LAB — BASIC METABOLIC PANEL
Anion gap: 7 (ref 5–15)
BUN: 16 mg/dL (ref 8–23)
CO2: 26 mmol/L (ref 22–32)
Calcium: 9.1 mg/dL (ref 8.9–10.3)
Chloride: 105 mmol/L (ref 98–111)
Creatinine, Ser: 1.4 mg/dL — ABNORMAL HIGH (ref 0.61–1.24)
GFR, Estimated: 53 mL/min — ABNORMAL LOW (ref >60)
Glucose, Bld: 100 mg/dL — ABNORMAL HIGH (ref 70–99)
Potassium: 4 mmol/L (ref 3.5–5.1)
Sodium: 138 mmol/L (ref 135–145)

## 2022-11-21 LAB — CBC
HCT: 42.4 % (ref 39.0–52.0)
Hemoglobin: 14.7 g/dL (ref 13.0–17.0)
MCH: 35.2 pg — ABNORMAL HIGH (ref 26.0–34.0)
MCHC: 34.7 g/dL (ref 30.0–36.0)
MCV: 101.4 fL — ABNORMAL HIGH (ref 80.0–100.0)
Platelets: 173 10*3/uL (ref 150–400)
RBC: 4.18 MIL/uL — ABNORMAL LOW (ref 4.22–5.81)
RDW: 13.2 % (ref 11.5–15.5)
WBC: 18.8 10*3/uL — ABNORMAL HIGH (ref 4.0–10.5)
nRBC: 0 % (ref 0.0–0.2)

## 2022-11-21 NOTE — Evaluation (Signed)
Physical Therapy Evaluation Patient Details Name: [name] Kim MRN: [id number] DOB: 1950/09/22 Today's Date: 11/21/2022  History of Present Illness  Patient is a 72 y.o. male presenting to [facility] with respiratory distress due to COPD exacerbation. PMH includes COPD on oxygen as needed, HLD, CAD s/p CABG, HIV, malignant lung nodule (squamous cell) s/p resection, seizure disorder, AAA, allergies, and remote tobacco abuse.   Clinical Impression  Upon evaluation, patient received seated EOB with spouse present in the room. Prior to admission, patient independent with all functional mobility and ADLs. He is supported by his spouse whom will be available PRN following discharge. In today's session patient able to progress ambulation distance while maintaining O2 above 88% with 3L/min. Two standing rest breaks provided in order to mitigate dyspnea on exertion. Educated patient on activity tolerance, supplemental oxygen requirements and pursed lip breathing techniques. No additional acute PT required; Recommend discharge to home with family support as needed.       Assistance Recommended at Discharge PRN  If plan is discharge home, recommend the following:  Can travel by private vehicle  Help with stairs or ramp for entrance;Assist for transportation        Equipment Recommendations None recommended by PT  Recommendations for Other Services       Functional Status Assessment Patient has not had a recent decline in their functional status     Precautions / Restrictions Restrictions Weight Bearing Restrictions: No      Mobility  Bed Mobility Overal bed mobility: Modified Independent             General bed mobility comments: Patient received seated upright at EOB.    Transfers Overall transfer level: Modified independent Equipment used: None               General transfer comment: Able to transfer without assistance from PT.    Ambulation/Gait Ambulation/Gait  assistance: Supervision Gait Distance (Feet): 300 Feet Assistive device: None Gait Pattern/deviations: Step-through pattern Gait velocity: Decreased Gait velocity interpretation: <1.8 ft/sec, indicate of risk for recurrent falls   General Gait Details: Presented with decreased velocity; pt required x2 standing rest breaks in order to maintain oxygen > 88%.  Stairs            Wheelchair Mobility     Tilt Bed    Modified Rankin (Stroke Patients Only)       Balance Overall balance assessment: No apparent balance deficits (not formally assessed)                                           Pertinent Vitals/Pain Pain Assessment Pain Assessment: No/denies pain    Home Living Family/patient expects to be discharged to:: Private residence Living Arrangements: Spouse/significant other Available Help at Discharge: Family;Available 24 hours/day Type of Home: House Home Access: Level entry     Alternate Level Stairs-Number of Steps: 14 Home Layout: Two level;Full bath on main level;Able to live on main level with bedroom/bathroom Home Equipment: Shower seat - built in      Prior Function Prior Level of Function : Independent/Modified Independent             Mobility Comments: Independent ADLs Comments: Independent     Hand Dominance   Dominant Hand: Right    Extremity/Trunk Assessment   Upper Extremity Assessment Upper Extremity Assessment: Overall WFL for tasks assessed    Lower  Extremity Assessment Lower Extremity Assessment: Overall WFL for tasks assessed    Cervical / Trunk Assessment Cervical / Trunk Assessment: Normal  Communication   Communication: No difficulties  Cognition Arousal/Alertness: Awake/alert Behavior During Therapy: WFL for tasks assessed/performed Overall Cognitive Status: Within Functional Limits for tasks assessed                                 General Comments: Pleasant throughout session .         General Comments General comments (skin integrity, edema, etc.): O2 Sat monitored throughout session; maintained above 88% throughout functional mobility on 3L/min. Spouse present throughout session.    Exercises     Assessment/Plan    PT Assessment Patient does not need any further PT services  PT Problem List         PT Treatment Interventions      PT Goals (Current goals can be found in the Care Plan section)  Acute Rehab PT Goals Patient Stated Goal: Patient would like to return home to cats and spouse PT Goal Formulation: All assessment and education complete, DC therapy    Frequency       Co-evaluation               AM-PAC PT "6 Clicks" Mobility  Outcome Measure Help needed turning from your back to your side while in a flat bed without using bedrails?: None Help needed moving from lying on your back to sitting on the side of a flat bed without using bedrails?: None Help needed moving to and from a bed to a chair (including a wheelchair)?: None Help needed standing up from a chair using your arms (e.g., wheelchair or bedside chair)?: None Help needed to walk in hospital room?: None Help needed climbing 3-5 steps with a railing? : A Little 6 Click Score: 23    End of Session Equipment Utilized During Treatment: Gait belt;Oxygen Activity Tolerance: Patient tolerated treatment well Patient left: in bed;with call bell/phone within reach;with family/visitor present Nurse Communication: Mobility status PT Visit Diagnosis: Other abnormalities of gait and mobility (R26.89);Difficulty in walking, not elsewhere classified (R26.2)    Time: 1610-9604 PT Time Calculation (min) (ACUTE ONLY): 22 min   Charges:   PT Evaluation $PT Eval Low Complexity: 1 Low   PT General Charges $$ ACUTE PT VISIT: 1 Visit         Christene Lye, SPT Acute Rehabilitation Services [phone] Secure chat preferred    Christene Lye 11/21/2022, 2:21 PM

## 2022-11-21 NOTE — Progress Notes (Signed)
PROGRESS NOTE  [name] Kim NGE:[id number] DOB: 1951-02-06 DOA: 11/20/2022 PCP: Tracey Harries, MD  Brief History   The patient is a 72 yr old may who presented to [facility] ED via ambulance from home with complaints of severe dyspnea upon wakening the morning of 11/20/2022. He carries a past medical history significant for COPD with chronic O2 requirement (prn), although he states that he never needs it. He states that his O2 sats are usually in the 90's on room air.He also has hyperlipidemia, CAD (s/p CABG), HIV ( on Biktarvy) Squamous cell Lung CA (s/p resection), a seizure disorder, AAA, and allergies.  The patient states that he has been wheezing and questions whether or not this exacerbation has been brought on by [place].   He has tested negative on the viral panel. He was admitted requiring 6L by nasal cannula to maintain saturations in the 90's. He is now requiring 3L to maintain saturations in the 90's.  He is receiving albuterol nebs, ceftriaxone, azithromycin, Anoro Ellipta, and oral prednisone.  He is also continued on his Biktarvy. Viral load and CD4 count is pending.  Consultants  None  Procedures  None  Antibiotics   Anti-infectives (From admission, onward)    Start     Dose/Rate Route Frequency Ordered Stop   11/20/22 1000  bictegravir-emtricitabine-tenofovir AF (BIKTARVY) 50-200-25 MG per tablet 1 tablet        1 tablet Oral Daily 11/20/22 0525     11/20/22 1000  azithromycin (ZITHROMAX) tablet 500 mg  Status:  Discontinued        500 mg Oral Daily 11/20/22 0525 11/20/22 0807   11/20/22 1000  azithromycin (ZITHROMAX) tablet 500 mg        500 mg Oral Daily 11/20/22 0807 11/25/22 0959   11/20/22 0900  cefTRIAXone (ROCEPHIN) 1 g in sodium chloride 0.9 % 100 mL IVPB        1 g 200 mL/hr over 30 Minutes Intravenous Every 24 hours 11/20/22 0807 11/25/22 0859        Subjective  The patient is resting comfortably. He states that he  is feeling much better, and wants to go home.  Objective   Vitals:  Vitals:   11/21/22 1641 11/21/22 1642  BP:  (!) 154/70  Pulse:  71  Resp:  18  Temp:  98 F (36.7 C)  SpO2: 94% 94%    Exam:  Constitutional:  The patient is awake, alert, and oriented x 3. No acute distress. Respiratory:  No increased work of breathing. Diminished breath sounds throughout. Small wheezes present. No rales or rhonchi No tactile fremitus Cardiovascular:  Regular rate and rhythm No murmurs, ectopy, or gallups. No lateral PMI. No thrills. Abdomen:  Abdomen is soft, non-tender, non-distended No hernias, masses, or organomegaly Normoactive bowel sounds.  Musculoskeletal:  No cyanosis, clubbing, or edema Skin:  No rashes, lesions, ulcers palpation of skin: no induration or nodules Neurologic:  CN 2-12 intact Sensation all 4 extremities intact Psychiatric:  Mental status Mood, affect appropriate Orientation to person, place, time  judgment and insight appear intact   I have personally reviewed the following:   Today's Data  CBC    Component Value Date/Time   WBC 18.8 (H) 11/21/2022 0032   RBC 4.18 (L) 11/21/2022 0032   HGB 14.7 11/21/2022 0032   HCT 42.4 11/21/2022 0032   PLT 173 11/21/2022 0032   MCV 101.4 (H) 11/21/2022 0032   MCH 35.2 (H) 11/21/2022  0032   MCHC 34.7 11/21/2022 0032   RDW 13.2 11/21/2022 0032   LYMPHSABS 3.9 11/20/2022 0330   MONOABS 1.5 (H) 11/20/2022 0330   EOSABS 0.7 (H) 11/20/2022 0330   BASOSABS 0.1 11/20/2022 0330      Latest Ref Rng & Units 11/21/2022   12:32 AM 11/20/2022    3:36 AM 11/20/2022    3:30 AM  BMP  Glucose 70 - 99 mg/dL 272   536   BUN 8 - 23 mg/dL 16   14   Creatinine 6.44 - 1.24 mg/dL 0.34   7.42   Sodium 595 - 145 mmol/L 138  139  139   Potassium 3.5 - 5.1 mmol/L 4.0  4.0  3.9   Chloride 98 - 111 mmol/L 105   101   CO2 22 - 32 mmol/L 26   24   Calcium 8.9 - 10.3 mg/dL 9.1   9.2     Micro Data   Results for orders placed  or performed during the hospital encounter of 11/20/22  Respiratory (~20 pathogens) panel by PCR     Status: None   Collection Time: 11/20/22  5:27 AM   Specimen: Nasopharyngeal Swab; Respiratory  Result Value Ref Range Status   Adenovirus NOT DETECTED NOT DETECTED Final   Coronavirus 229E NOT DETECTED NOT DETECTED Final    Comment: (NOTE) The Coronavirus on the Respiratory Panel, DOES NOT test for the novel  Coronavirus (2019 nCoV)    Coronavirus HKU1 NOT DETECTED NOT DETECTED Final   Coronavirus NL63 NOT DETECTED NOT DETECTED Final   Coronavirus OC43 NOT DETECTED NOT DETECTED Final   Metapneumovirus NOT DETECTED NOT DETECTED Final   Rhinovirus / Enterovirus NOT DETECTED NOT DETECTED Final   Influenza A NOT DETECTED NOT DETECTED Final   Influenza B NOT DETECTED NOT DETECTED Final   Parainfluenza Virus 1 NOT DETECTED NOT DETECTED Final   Parainfluenza Virus 2 NOT DETECTED NOT DETECTED Final   Parainfluenza Virus 3 NOT DETECTED NOT DETECTED Final   Parainfluenza Virus 4 NOT DETECTED NOT DETECTED Final   Respiratory Syncytial Virus NOT DETECTED NOT DETECTED Final   Bordetella pertussis NOT DETECTED NOT DETECTED Final   Bordetella Parapertussis NOT DETECTED NOT DETECTED Final   Chlamydophila pneumoniae NOT DETECTED NOT DETECTED Final   Mycoplasma pneumoniae NOT DETECTED NOT DETECTED Final    Comment: Performed at [facility], [address], [place], [place] [address]  SARS Coronavirus 2 by RT PCR (hospital order, performed in [facility] hospital lab) *cepheid single result test* Nasopharyngeal Swab     Status: None   Collection Time: 11/20/22  5:28 AM   Specimen: Nasopharyngeal Swab; Nasal Swab  Result Value Ref Range Status   SARS Coronavirus 2 by RT PCR NEGATIVE NEGATIVE Final    Comment: Performed at [facility], [address], [place], [place] [address]    Imaging  CXR: No acute cardio-pulmonary disease  Cardiology Data  EKG: NSR with normal QTc  Scheduled  Meds:  albuterol  2.5 mg Nebulization BID   aspirin EC  81 mg Oral Daily   atorvastatin  20 mg Oral Daily   azithromycin  500 mg Oral Daily   bictegravir-emtricitabine-tenofovir AF  1 tablet Oral Daily   enoxaparin (LOVENOX) injection  40 mg Subcutaneous Q24H   ezetimibe  10 mg Oral Daily   folic acid  500 mcg Oral Daily   guaiFENesin  600 mg Oral BID   lamoTRIgine  100 mg Oral BID  loratadine  10 mg Oral Daily   omega-3 acid ethyl esters  1 capsule Oral BID   predniSONE  40 mg Oral Q breakfast   sodium chloride flush  3 mL Intravenous Q12H   umeclidinium-vilanterol  1 puff Inhalation Daily   Continuous Infusions:  cefTRIAXone (ROCEPHIN)  IV 1 g (11/21/22 0912)    Principal Problem:   COPD with acute exacerbation (HCC) Active Problems:   Acute respiratory failure with hypoxia (HCC)   Human immunodeficiency virus (HIV) disease (HCC)   History of lung cancer   Coronary artery disease   Seizure disorder (HCC)   Abdominal aortic aneurysm (AAA) without rupture (HCC)   History of tobacco abuse   A & P  Assessment and Plan:   Acute respiratory failure secondary to COPD exacerbation Patient presented with acute onset of shortness of breath.  Tried using his rescue inhaler without improvement in symptoms.  On physical exam decreased aeration with expiratory wheezes appreciated.  Temporarily placed on BiPAP due to work of breathing and transition to 3 L of oxygen.  Normally does not have to use home oxygen..  Chest x-ray without acute abnormality appreciated.  Patient has been given Solu-Medrol 125 mg IV, magnesium sulfate 2 g IV, and multiple breathing treatments.  Last hospitalization 08/2022 for COPD exacerbation -Admit to a telemetry bed -Continuous pulse oximetry with oxygen to maintain O2 saturations greater than 90% -Incentive spirometry and flutter valve -Check sputum culture -Albuterol nebs 4 times daily and every 2 hours as needed -Continue Anoro Ellipta -Empiric  antibiotics of Rocephin -Prednisone 40 mg daily -Currently saturating 94% on 3L by nasal cannula. Wean as possible.  -Claritin -PT to evaluate   HIV Patient reports compliance with antiviral medications.  Last CD4 count 560 and HIV viral load undetectable on 03/22/2022. -Continue Biktarvy -Current Viral load and CD4 pending.   History of malignant pulmonary nodule Status post resection back in 2022.  Patient had recent follow-up CT scan of the chest done on 6/28. -CT 6/28 has demonstrated stable appearance of the chest with exception for interval development of mild T6 end plate compression fracture.  CAD Patient with prior CABG in 2007.  Has any complaints of chest pain. -Continue aspirin, statin, Zetia   Seizure disorder -Continue Lamictal   AAA Last ultrasound of the aorta noted small aneurysm of the infrarenal abdominal aorta measuring 3.6 cm back in 2021. -Patient in need of repeat aorta ultrasound for monitoring of AAA as recommended every 2 years   History of tobacco abuse Patient quit smoking several years ago.   DVT prophylaxis: Lovenox Advance Care Planning:   Code Status: Full Code    Consults: None   Family Communication: None requested when asked.  I have seen and examined this patient myself. I have spent 32 minutes in his evaluation and care.  DVT prophylaxis: Lovenox Code Status: Full Code Family Communication: Partner at bedside Disposition Plan: Home    Raschelle Wisenbaker, DO [facility] Direct contact: see [email or web address]  7PM-7AM contact night coverage as above 11/21/2022, 6:11 PM  LOS: 1 day   LOS: 1 day

## 2022-11-22 DIAGNOSIS — J441 Chronic obstructive pulmonary disease with (acute) exacerbation: Secondary | ICD-10-CM | POA: Diagnosis not present

## 2022-11-22 MED ORDER — PREDNISONE 10 MG PO TABS: ORAL_TABLET | ORAL | 0 refills | Status: AC

## 2022-11-22 MED ORDER — GUAIFENESIN ER 600 MG PO TB12: 1200.0000 mg | ORAL_TABLET | Freq: Two times a day (BID) | ORAL | 0 refills | Status: DC

## 2022-11-22 MED ORDER — CEFPODOXIME PROXETIL 200 MG PO TABS: 200.0000 mg | ORAL_TABLET | Freq: Two times a day (BID) | ORAL | 0 refills | Status: AC

## 2022-11-22 MED ORDER — LORATADINE 10 MG PO TABS: 10.0000 mg | ORAL_TABLET | Freq: Every day | ORAL | 0 refills | Status: DC

## 2022-11-22 MED ORDER — AZITHROMYCIN 500 MG PO TABS: 500.0000 mg | ORAL_TABLET | Freq: Every day | ORAL | 0 refills | Status: DC

## 2022-11-22 NOTE — Progress Notes (Signed)
Patient asking about plan for discharge- asking to speak with provider. Reiterated what was told earlier. Patient reports that his partner is on the way- [employer] has serviced the car. Says he has no other way home and says that he does not want to take a cab home. Patient he reports he has to go home today. Says he will leave AMA if he has to when his ride gets here. MD updated.

## 2022-11-22 NOTE — Progress Notes (Signed)
Unable to wean supplemental o2 overnight. DOE noted. Activity intolerance. Pt requested o2 increased to [facility] after getting up to urinate. Pt is planning on going home today. He did state he has o2 setup at home.

## 2022-11-22 NOTE — Progress Notes (Signed)
Patient inquiring about whether he will be discharged today. Secure chat message sent to provider. Patient informed per provider, that it depends on weaning his oxygen. Patient reports "Oh, that's not going to happen. I'm leaving today. I have to leave today"  He reports that his partner contacted him this morning and stated they are having some car issues- [employer] been contacted. Patient weaned down to 2L o2 at this time SpO2 92%.

## 2022-11-22 NOTE — TOC Transition Note (Signed)
Transition of Care [facility]) - CM/SW Discharge Note   Patient Details  Name: [name] Kim MRN: [id number] Date of Birth: 1950/11/18  Transition of Care [facility]) CM/SW Contact:  Leone Haven, RN Phone Number: 11/22/2022, 11:40 AM   Clinical Narrative:    For dc today, he has home concentrator and some small oxygen tanks.  His sign other is here to transport him home but he does not have an oxygen tank with him.  NCM contacted Mitch with [employer], they will bring up an oxygen tank for patient to go home with.  NCM asked patient if he needs [employer] to send someone out to look at his concentrator since he said is does not work well. He states no, it works ok, he does not need any one to come to look at it.     Final next level of care: Home/Self Care Barriers to Discharge: No Barriers Identified   Patient Goals and CMS Choice   Choice offered to / list presented to : NA  Discharge Placement                         Discharge Plan and Services Additional resources added to the After Visit Summary for   In-house Referral: NA Discharge Planning Services: CM Consult Post Acute Care Choice: NA          DME Arranged: Oxygen DME Agency: [employer] Date DME Agency Contacted: 11/22/22 Time DME Agency Contacted: [phone] Representative spoke with at DME Agency: Clovis Riley HH Arranged: NA          Social Determinants of Health (SDOH) Interventions SDOH Screenings   Food Insecurity: No Food Insecurity (11/20/2022)  Housing: Low Risk  (11/20/2022)  Transportation Needs: No Transportation Needs (11/20/2022)  Utilities: Not At Risk (11/20/2022)  Depression (PHQ2-9): Low Risk  (04/05/2022)  Tobacco Use: Medium Risk (11/20/2022)     Readmission Risk Interventions     No data to display

## 2022-11-22 NOTE — Discharge Summary (Signed)
Physician Discharge Summary   Patient: [name] Kim MRN: [id number] DOB: 03/26/1951  Admit date:     11/20/2022  Discharge date: 11/22/22  Discharge Physician: [name] Kim   PCP: Tracey Harries, MD   Recommendations at discharge:    Follow up with PCP in 7-10 days. Return to ED if you are short of breath on 3L O2 by nasal cannula and are needing to use more oxygen or develop fever.  Discharge Diagnoses: Principal Problem:   COPD with acute exacerbation (HCC) Active Problems:   Acute respiratory failure with hypoxia (HCC)   Human immunodeficiency virus (HIV) disease (HCC)   History of lung cancer   Coronary artery disease   Seizure disorder (HCC)   Abdominal aortic aneurysm (AAA) without rupture (HCC)   History of tobacco abuse  Resolved Problems:   * No resolved hospital problems. [facility] Course: The patient is a 73 yr old may who presented to [facility] ED via ambulance from home with complaints of severe dyspnea upon wakening the morning of 11/20/2022. He carries a past medical history significant for COPD with chronic O2 requirement (prn), although he states that he never needs it. He states that his O2 sats are usually in the 90's on room air.He also has hyperlipidemia, CAD (s/p CABG), HIV ( on Biktarvy) Squamous cell Lung CA (s/p resection), a seizure disorder, AAA, and allergies.   The patient states that he has been wheezing and questions whether or not this exacerbation has been brought on [place]    He has tested negative on the viral panel. He was admitted requiring 6L by nasal cannula to maintain saturations in the 90's. He is now requiring 3L to maintain saturations in the 90's.   He is receiving albuterol nebs, ceftriaxone, azithromycin, Anoro Ellipta, and oral prednisone.   He is also continued on his Biktarvy. Viral load and CD4 count is pending.  This morning the patient was requiring 3L O2 in order to maintain  saturations in the 90's. His baseline is that he doesn't require O2. He continued to have some wheezes on exam, although lung sounds were improved. Although the patient would clearly benefit from remaining inpatient to continue to improve, the patient was insistent upon discharge to home. He has promised that he would return to the ED should he have increased shortness of breath, cough or new fever.  He was discharged to home with order for home oxygen therapy at 3L by [place].  Assessment and Plan:   Acute respiratory failure secondary to COPD exacerbation Patient presented with acute onset of shortness of breath.  Tried using his rescue inhaler without improvement in symptoms.  On physical exam decreased aeration with expiratory wheezes appreciated.  Temporarily placed on BiPAP due to work of breathing and transition to 3 L of oxygen.  Normally does not have to use home oxygen..  Chest x-ray without acute abnormality appreciated.  Patient has been given Solu-Medrol 125 mg IV, magnesium sulfate 2 g IV, and multiple breathing treatments.  Last hospitalization 08/2022 for COPD exacerbation  -Currently saturating 94% on 3L by nasal cannula.  -Claritin   HIV Patient reports compliance with antiviral medications.  Last CD4 count 560 and HIV viral load undetectable on 03/22/2022. -Continue Biktarvy -Current Viral load and CD4 pending.   History of malignant pulmonary nodule Status post resection back in 2022.  Patient had recent follow-up CT scan of the chest done on 6/28. -CT 6/28 has  demonstrated stable appearance of the chest with exception for interval development of mild T6 end plate compression fracture.   CAD Patient with prior CABG in 2007.  Has any complaints of chest pain. -Continue aspirin, statin, Zetia   Seizure disorder -Continue Lamictal   AAA Last ultrasound of the aorta noted small aneurysm of the infrarenal abdominal aorta measuring 3.6 cm back in 2021. -Patient in need of repeat  aorta ultrasound for monitoring of AAA as recommended every 2 years   History of tobacco abuse Patient quit smoking several years ago.   DVT prophylaxis: Lovenox Advance Care Planning:   Code Status: Full Code    Consults: None   Family Communication: None requested when asked.   I have seen and examined this patient myself. I have spent 32 minutes in his evaluation and care.   DVT prophylaxis: Lovenox Code Status: Full Code Family Communication: Partner at bedside Disposition Plan: Home      Aili Casillas, DO [facility] Direct contact: see [email or web address]  7PM-7AM contact night coverage as above  Diet recommendation:  Discharge Diet Orders (From admission, onward)     Start     Ordered   11/22/22 0000  Diet - low sodium heart healthy        11/22/22 1200            DISCHARGE MEDICATION: Allergies as of 11/22/2022       Reactions   Zidovudine Rash, Other (See Comments)   Caused bone marrow problems  2000        Medication List     STOP taking these medications    Clenpiq 10-3.5-12 MG-GM -GM/175ML Soln Generic drug: Sod Picosulfate-Mag Ox-Cit Acd       TAKE these medications    albuterol 108 (90 Base) MCG/ACT inhaler Commonly known as: VENTOLIN HFA INHALE 1 PUFF INTO THE LUNGS EVERY 6 HOURS AS NEEDED FOR WHEEZING OR SHORTNESS OF BREATH What changed: See the new instructions.   Anoro Ellipta 62.5-25 MCG/ACT Aepb Generic drug: umeclidinium-vilanterol Inhale 1 puff into the lungs daily.   aspirin 81 MG tablet Take 81 mg by mouth daily.   atorvastatin 20 MG tablet Commonly known as: LIPITOR TAKE 1 TABLET(20 MG) BY MOUTH DAILY AT 6 PM What changed:  how much to take how to take this when to take this   azithromycin 500 MG tablet Commonly known as: ZITHROMAX Take 1 tablet (500 mg total) by mouth daily.   Biktarvy 50-200-25 MG Tabs tablet Generic drug: bictegravir-emtricitabine-tenofovir AF Take 1 tablet by mouth daily.   cefpodoxime  200 MG tablet Commonly known as: VANTIN Take 1 tablet (200 mg total) by mouth 2 (two) times daily for 3 days.   ezetimibe 10 MG tablet Commonly known as: ZETIA TAKE 1 TABLET(10 MG) BY MOUTH DAILY What changed:  how much to take how to take this when to take this additional instructions   folic acid 400 MCG tablet Commonly known as: FOLVITE Take 400 mcg by mouth in the morning.   guaiFENesin 600 MG 12 hr tablet Commonly known as: MUCINEX Take 2 tablets (1,200 mg total) by mouth 2 (two) times daily.   lamoTRIgine 100 MG tablet Commonly known as: LAMICTAL Take 100 mg by mouth 2 (two) times daily.   loratadine 10 MG tablet Commonly known as: CLARITIN Take 1 tablet (10 mg total) by mouth daily. Start taking on: November 23, 2022   omega-3 acid ethyl esters 1 g capsule Commonly known as: LOVAZA TAKE 1 CAPSULE BY  MOUTH TWICE DAILY   predniSONE 10 MG tablet Commonly known as: DELTASONE Take 4 tablets (40 mg total) by mouth daily for 3 days, THEN 3 tablets (30 mg total) daily for 3 days, THEN 2 tablets (20 mg total) daily for 3 days, THEN 1 tablet (10 mg total) daily for 3 days, THEN 0.5 tablets (5 mg total) daily for 3 days. Start taking on: November 22, 2022 What changed: See the new instructions.        Discharge Exam: Filed Weights   11/20/22 0654 11/21/22 0446 11/22/22 0440  Weight: 60.1 kg 59.3 kg 58.7 kg   Exam:  Constitutional:  The patient is awake, alert, and oriented x 3. No acute distress. Respiratory:  No increased work of breathing. Positive for small wheezes throughout, rales, or rhonchi No tactile fremitus Cardiovascular:  Regular rate and rhythm No murmurs, ectopy, or gallups. No lateral PMI. No thrills. Abdomen:  Abdomen is soft, non-tender, non-distended No hernias, masses, or organomegaly Normoactive bowel sounds.  Musculoskeletal:  No cyanosis, clubbing, or edema Skin:  No rashes, lesions, ulcers palpation of skin: no induration or  nodules Neurologic:  CN 2-12 intact Sensation all 4 extremities intact Psychiatric:  Mental status Mood, affect appropriate Orientation to person, place, time  judgment and insight appear intact   Condition at discharge: stable  The results of significant diagnostics from this hospitalization (including imaging, microbiology, ancillary and laboratory) are listed below for reference.   Imaging Studies: CT Chest Wo Contrast  Result Date: 11/20/2022 CLINICAL DATA:  Restaging lung cancer post radiation therapy. * Tracking Code: BO * EXAM: CT CHEST WITHOUT CONTRAST TECHNIQUE: Multidetector CT imaging of the chest was performed following the standard protocol without IV contrast. RADIATION DOSE REDUCTION: This exam was performed according to the departmental dose-optimization program which includes automated exposure control, adjustment of the mA and/or kV according to patient size and/or use of iterative reconstruction technique. COMPARISON:  Chest CTA 09/17/2022 and chest CTs 06/12/2022 and 02/25/2021. PET-CT 03/07/2021. FINDINGS: Cardiovascular: Atherosclerosis of the aorta, great vessels and coronary arteries status post median sternotomy and CABG. The heart size is normal. There is no pericardial effusion. Mediastinum/Nodes: There are no enlarged mediastinal, hilar or axillary lymph nodes.Scattered small mediastinal lymph nodes appear unchanged. Hilar assessment is limited by the lack of intravenous contrast, although the hilar contours appear unchanged. The thyroid gland, trachea and esophagus demonstrate no significant findings. Lungs/Pleura: No pleural effusion or pneumothorax. Moderate centrilobular and paraseptal emphysema with scattered central airway thickening and mild chronic biapical scarring. There is stable scarring and architectural distortion within the lingula. Stable postsurgical changes anteriorly in the left lower lobe without evidence of local recurrence. No suspicious pulmonary  nodules. Upper abdomen: No suspicious findings are seen within the visualized upper abdomen status post cholecystectomy. There are low-density left renal lesions consistent with cysts; no specific follow-up imaging recommended. Musculoskeletal/Chest wall: Status post median sternotomy. There are scattered degenerative changes in the thoracic spine. A mild superior endplate compression fracture at T6 has developed in the interval. This demonstrates no pathologic features. No evidence of osseous metastatic disease. Unless specific follow-up recommendations are mentioned in the findings or impression sections, no imaging follow-up of any mentioned incidental findings is recommended. IMPRESSION: 1. Stable chest CT status post left lower lobe wedge resection. No evidence of local recurrence or metastatic disease. 2. Interval development of a mild superior endplate compression fracture at T6 without pathologic features. 3. Aortic Atherosclerosis (ICD10-I70.0) and Emphysema (ICD10-J43.9). Electronically Signed   By: Chrissie Noa  Purcell Mouton M.D.   On: 11/20/2022 15:20   DG Chest Port 1 View  Result Date: 11/20/2022 CLINICAL DATA:  Shortness of breath EXAM: PORTABLE CHEST 1 VIEW COMPARISON:  CT chest dated 11/17/2022 FINDINGS: Lungs are essentially clear.  No pleural effusion or pneumothorax. The heart is normal in size. Postsurgical changes related to prior CABG. Median sternotomy. IMPRESSION: No acute cardiopulmonary disease. Electronically Signed   By: Charline Bills M.D.   On: 11/20/2022 03:50    Microbiology: Results for orders placed or performed during the hospital encounter of 11/20/22  Respiratory (~20 pathogens) panel by PCR     Status: None   Collection Time: 11/20/22  5:27 AM   Specimen: Nasopharyngeal Swab; Respiratory  Result Value Ref Range Status   Adenovirus NOT DETECTED NOT DETECTED Final   Coronavirus 229E NOT DETECTED NOT DETECTED Final    Comment: (NOTE) The Coronavirus on the Respiratory Panel,  DOES NOT test for the novel  Coronavirus (2019 nCoV)    Coronavirus HKU1 NOT DETECTED NOT DETECTED Final   Coronavirus NL63 NOT DETECTED NOT DETECTED Final   Coronavirus OC43 NOT DETECTED NOT DETECTED Final   Metapneumovirus NOT DETECTED NOT DETECTED Final   Rhinovirus / Enterovirus NOT DETECTED NOT DETECTED Final   Influenza A NOT DETECTED NOT DETECTED Final   Influenza B NOT DETECTED NOT DETECTED Final   Parainfluenza Virus 1 NOT DETECTED NOT DETECTED Final   Parainfluenza Virus 2 NOT DETECTED NOT DETECTED Final   Parainfluenza Virus 3 NOT DETECTED NOT DETECTED Final   Parainfluenza Virus 4 NOT DETECTED NOT DETECTED Final   Respiratory Syncytial Virus NOT DETECTED NOT DETECTED Final   Bordetella pertussis NOT DETECTED NOT DETECTED Final   Bordetella Parapertussis NOT DETECTED NOT DETECTED Final   Chlamydophila pneumoniae NOT DETECTED NOT DETECTED Final   Mycoplasma pneumoniae NOT DETECTED NOT DETECTED Final    Comment: Performed at [facility], [address], [place], [place] [address]  SARS Coronavirus 2 by RT PCR (hospital order, performed in [facility] hospital lab) *cepheid single result test* Nasopharyngeal Swab     Status: None   Collection Time: 11/20/22  5:28 AM   Specimen: Nasopharyngeal Swab; Nasal Swab  Result Value Ref Range Status   SARS Coronavirus 2 by RT PCR NEGATIVE NEGATIVE Final    Comment: Performed at [facility], [address], [place], [place] [address]    Labs: CBC: Recent Labs  Lab 11/20/22 0330 11/20/22 0336 11/21/22 0032  WBC 10.5  --  18.8*  NEUTROABS 4.2  --   --   HGB 15.9 16.0 14.7  HCT 47.4 47.0 42.4  MCV 103.3*  --  101.4*  PLT 188  --  173   Basic Metabolic Panel: Recent Labs  Lab 11/20/22 0330 11/20/22 0336 11/21/22 0032  NA 139 139 138  K 3.9 4.0 4.0  CL 101  --  105  CO2 24  --  26  GLUCOSE 126*  --  100*  BUN 14  --  16  CREATININE 1.23  --  1.40*  CALCIUM 9.2  --  9.1   Liver Function Tests: No results  for input(s): "AST", "ALT", "ALKPHOS", "BILITOT", "PROT", "ALBUMIN" in the last 168 hours. CBG: No results for input(s): "GLUCAP" in the last 168 hours.  Discharge time spent: greater than 30 minutes.  Signed: Tayden Duran, DO [facility] 11/22/2022

## 2022-11-22 NOTE — Progress Notes (Signed)
SATURATION QUALIFICATIONS: (This note is used to comply with regulatory documentation for home oxygen)  Patient Saturations on Room Air at Rest = 88%  Patient Saturations on Room Air while Ambulating = 83%  Patient Saturations on 3 Liters of oxygen while Ambulating = 91%  Please briefly explain why patient needs home oxygen: Patient Desats while Walking

## 2022-11-23 LAB — HIV-1 RNA QUANT-NO REFLEX-BLD
HIV 1 RNA Quant: <20 copies/mL
LOG10 HIV-1 RNA: UNDETERMINED log10copy/mL

## 2022-12-06 ENCOUNTER — Ambulatory Visit: Payer: Medicare HMO | Admitting: Emergency Medicine

## 2022-12-07 ENCOUNTER — Encounter: Payer: Self-pay | Admitting: Emergency Medicine

## 2022-12-07 ENCOUNTER — Ambulatory Visit: Payer: Medicare HMO | Admitting: Emergency Medicine

## 2022-12-07 VITALS — BP 120/70 | HR 51 | Temp 98.0°F | Ht 66.5 in | Wt 144.0 lb

## 2022-12-07 DIAGNOSIS — J9611 Chronic respiratory failure with hypoxia: Secondary | ICD-10-CM

## 2022-12-07 DIAGNOSIS — J449 Chronic obstructive pulmonary disease, unspecified: Secondary | ICD-10-CM | POA: Diagnosis not present

## 2022-12-07 DIAGNOSIS — C3492 Malignant neoplasm of unspecified part of left bronchus or lung: Secondary | ICD-10-CM | POA: Diagnosis not present

## 2022-12-07 DIAGNOSIS — R911 Solitary pulmonary nodule: Secondary | ICD-10-CM | POA: Diagnosis not present

## 2022-12-07 NOTE — Assessment & Plan Note (Signed)
Wear your oxygen at 2-3 L/min with exertion.  Our goal is to keep your oxygen saturations > 90%

## 2022-12-07 NOTE — Assessment & Plan Note (Signed)
We reviewed your CT scan of the chest from June. We will plan to repeat your CT chest in December 2024 to ensure stability. Follow Dr. Delton Coombes in December after your CT chest so we can review those results together.

## 2022-12-07 NOTE — Patient Instructions (Addendum)
Continue Anoro once daily as you have been taking Keep your albuterol available to use 2 puffs when needed for shortness of breath, chest tightness, wheezing. Wear your oxygen at 2-3 L/min with exertion.  Our goal is to keep your oxygen saturations > 90% We reviewed your CT scan of the chest from June. We will plan to repeat your CT chest in December 2024 to ensure stability. Follow Dr. Delton Coombes in December after your CT chest so we can review those results together.

## 2022-12-07 NOTE — Progress Notes (Signed)
   Subjective:    Patient ID: [name] Kim, male    DOB: 04-09-1951, 72 y.o.   MRN: [id number]   HPI   ROV 06/15/22 --72 year old male with history tobacco use, COPD, HIV, CAD/CABG, partial seizures.  He also has a history of left lower lobe squamous cell lung cancer, treated surgically at [facility].  On his last CT scan from [facility] there was a new nonspecific medial right upper lobe hazy opacity.  We repeated his CT as below.  CT chest 06/12/2022 reviewed by me, shows centrilobular emphysema, mild left apical pleural thickening, bronchiectatic change in the lingula greater than the middle lobe, obliteration of the spiculated left lower lobe nodule, no new or recurrent mass lesions. The Right medial GG opacity has resolved.    ROV 12/07/2022 --[name] Kim follows up today for his history of COPD, HIV, CAD/CABG, partial seizures and left lower lobe squamous cell lung cancer (surgery at [facility]).  We have been following him with CT scans of the chest most recent 11/17/2022 Her has been able to exert, has had some exertional SOB but able to do yard work. Insurance asked him to change Anoro to bevespi but he could not tolerate - urinary retention, less effective. He had a PA nad is now back on Anoro - notes that he is feeling better on this. He was hospitalized for an AE-COPD in late June. He was sent home to use 3L/min but he has not used this.   CT scan of the chest 11/17/2022 reviewed by me, shows no mediastinal or hilar adenopathy, moderate centrilobular and paraseptal emphysema with central airway thickening, stable lingular scarring and postsurgical changes in the anterior left lower lobe without any evidence of recurrence.  No suspicious nodules or infiltrates.   Review of Systems As per HPI     Objective:   Physical Exam Vitals:   12/07/22 0942  BP: 120/70  Pulse: (!) 51  Temp: 98 F (36.7 C)  SpO2: 94%  Weight: 144 lb (65.3 kg)  Height: 5' 6.5" (1.689 m)    Gen: Pleasant, well-nourished, in no  distress,  normal affect  ENT: No lesions,  mouth clear,  oropharynx clear, no postnasal drip  Neck: No JVD, no stridor  Lungs: No use of accessory muscles, no crackles or wheezing on normal respiration, no wheeze on forced expiration  Cardiovascular: RRR, heart sounds normal, no murmur or gallops, no peripheral edema  Musculoskeletal: No deformities, no cyanosis or clubbing  Neuro: alert, awake, non focal  Skin: Warm, no lesions or rash      Assessment & Plan:   COPD (chronic obstructive pulmonary disease) (HCC) Continue Anoro once daily as you have been taking Keep your albuterol available to use 2 puffs when needed for shortness of breath, chest tightness, wheezing.  Squamous cell lung cancer, left [facility]) We reviewed your CT scan of the chest from June. We will plan to repeat your CT chest in December 2024 to ensure stability. Follow Dr. Delton Coombes in December after your CT chest so we can review those results together.  Chronic respiratory failure with hypoxia (HCC) Wear your oxygen at 2-3 L/min with exertion.  Our goal is to keep your oxygen saturations > 90%     Levy Pupa, MD, PhD 12/07/2022, 12:52 PM [facility] [phone] or if no answer before 7:00PM call [phone] For any issues after 7:00PM please call eLink [phone]

## 2022-12-07 NOTE — Assessment & Plan Note (Signed)
Continue Anoro once daily as you have been taking Keep your albuterol available to use 2 puffs when needed for shortness of breath, chest tightness, wheezing.

## 2022-12-18 IMAGING — CT CT CHEST SUPER D W/O CM
2 of 5 series · 15 of 36 positions shown, 18 images · non-contrast
Comparison: 11/29/2020, 12/17/2007.

CLINICAL DATA: Intermittent shortness of breath, preop planning.

EXAM:
CT CHEST WITHOUT CONTRAST
TECHNIQUE: Multidetector CT imaging of the chest was performed using thin slice
collimation for electromagnetic bronchoscopy planning purposes,
without intravenous contrast.

[Series 4: thins chest 0.60 · axial · 0.69mm/px · z∈[-1235,-897]mm · 12 of 640 slices shown, 15 images]
[im 38/640  mediastinal]
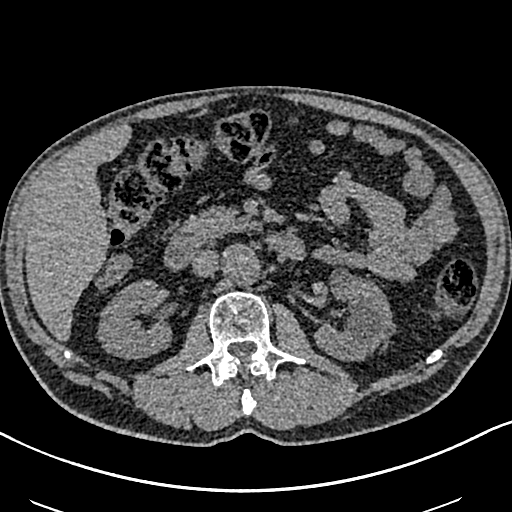
[im 38/640  lung]
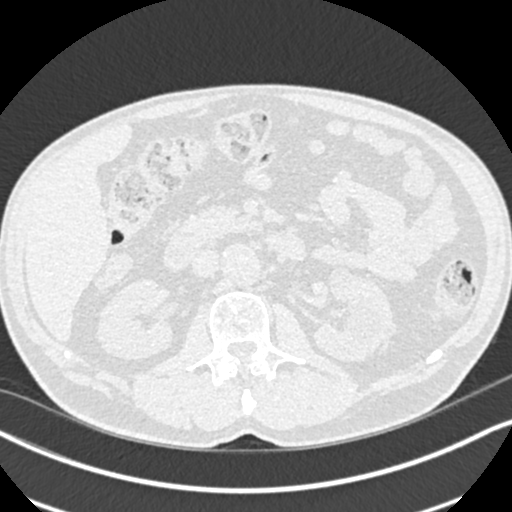
[im 113/640  lung]
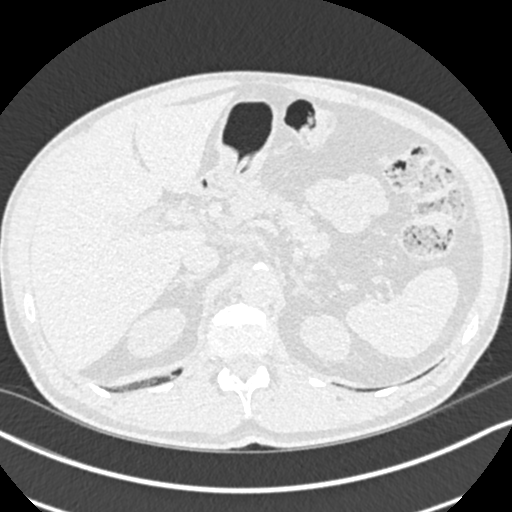
[im 151/640  lung]
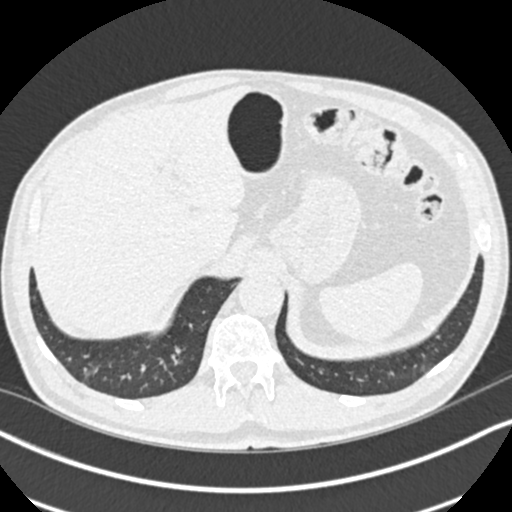
[im 188/640  lung]
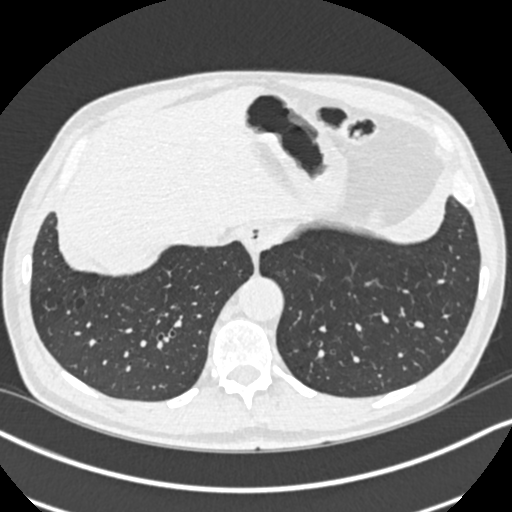
[im 264/640  mediastinal]
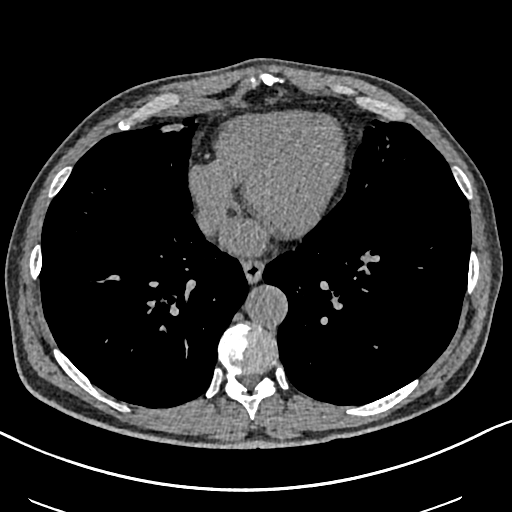
[im 264/640  lung]
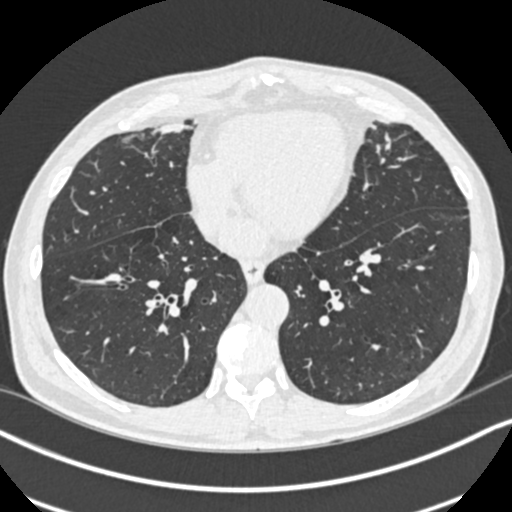
[im 301/640  lung]
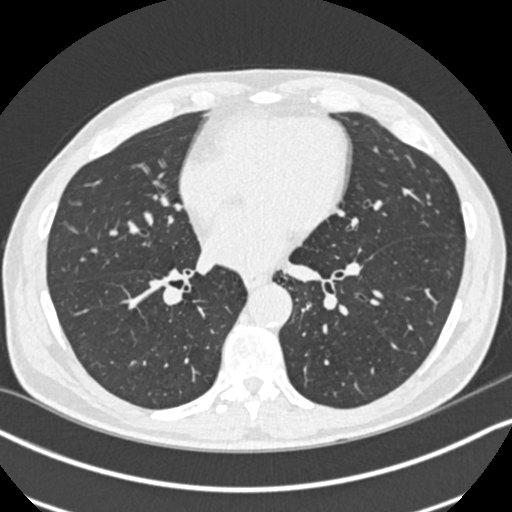
[im 339/640  lung]
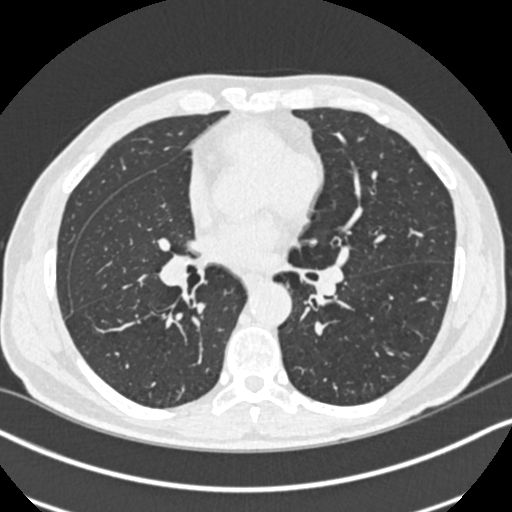
[im 414/640  lung]
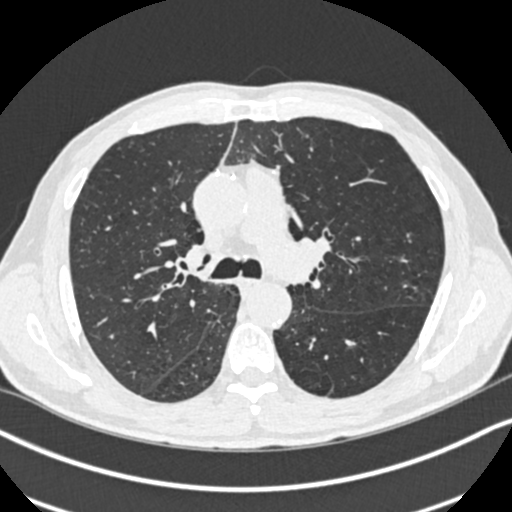
[im 452/640  mediastinal]
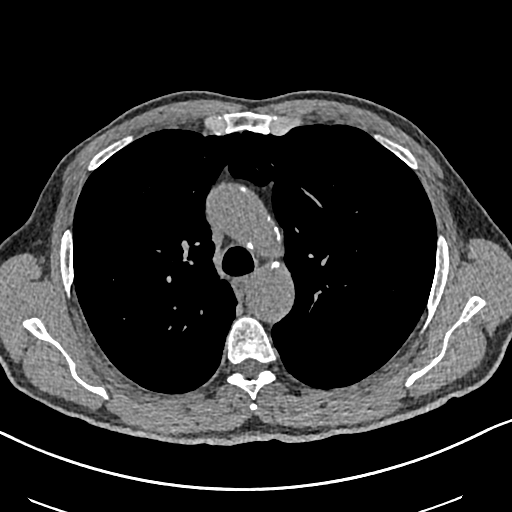
[im 452/640  lung]
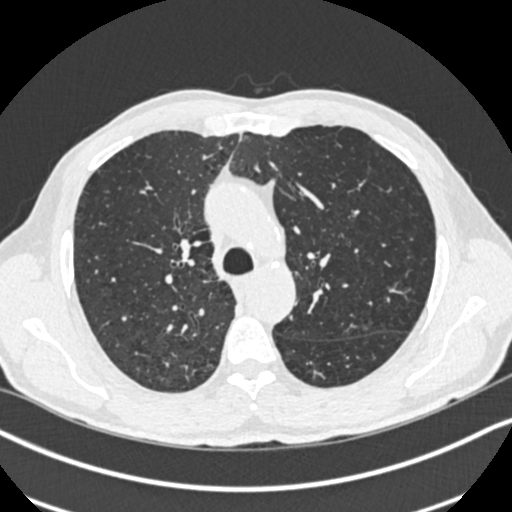
[im 489/640  lung]
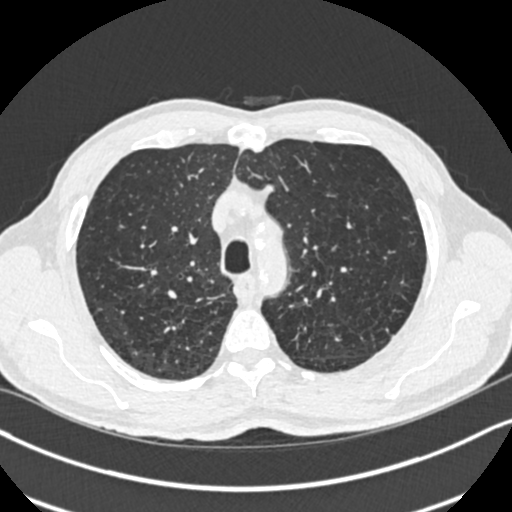
[im 564/640  lung]
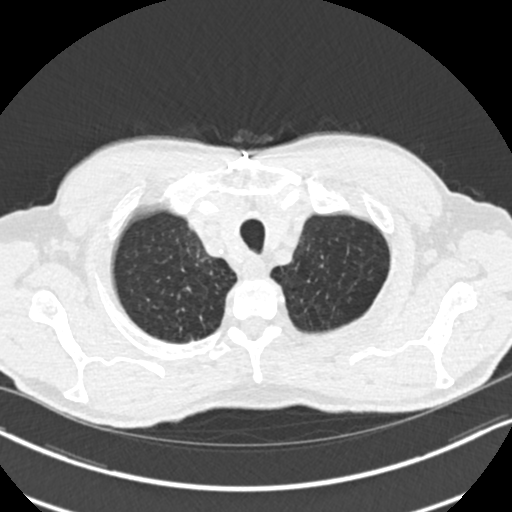
[im 602/640  lung]
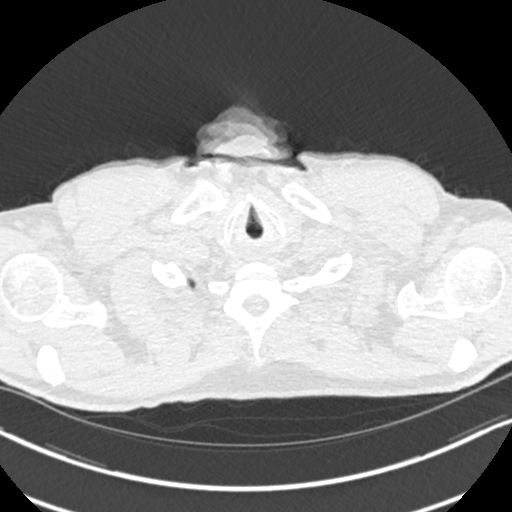

[Series 5: coronals chest 2.00 cor · coronal · 0.69mm/px · 3 of 138 slices shown]
[im 28/138  lung]
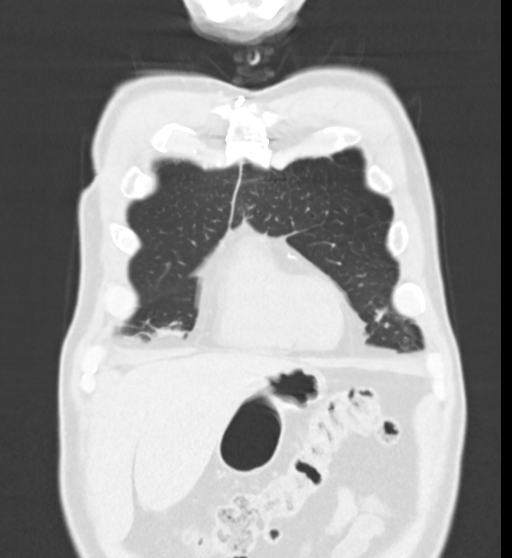
[im 55/138  lung]
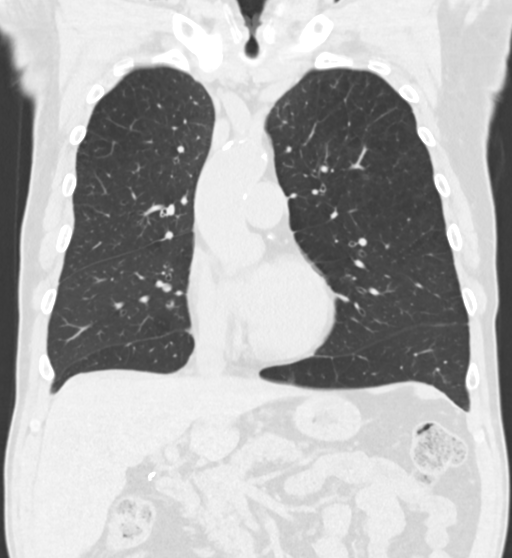
[im 83/138  lung]
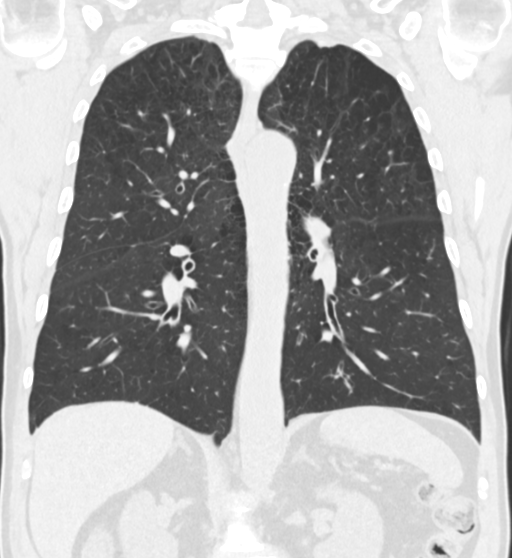

[15 of 36 positions shown; findings below may reference images not displayed]

FINDINGS: Cardiovascular: Atherosclerotic calcification of the aorta. Heart
size normal. No pericardial effusion.

Mediastinum/Nodes: No pathologically enlarged mediastinal or
axillary lymph nodes. Esophagus is grossly unremarkable.

Lungs/Pleura: Biapical pleuroparenchymal scarring. Centrilobular
emphysema. Scattered mucoid impaction. Volume loss and scarring in
the right middle lobe and lingula. Spiculated nodule in the
anterolateral left lower lobe measures 7 x 9 mm (3/117), unchanged
from 11/29/2020 and new from 12/17/2007. No pleural fluid. Airway is
unremarkable.

Upper Abdomen: Visualized portions of the liver, adrenal glands and
right kidney are unremarkable. 2.6 cm low-density lesion in the left
kidney is indicative of a cyst. Visualized portions of the spleen,
pancreas, stomach and bowel are grossly unremarkable.
Cholecystectomy. No upper abdominal adenopathy.

Musculoskeletal: No worrisome lytic or sclerotic lesions.
IMPRESSION: 1. Spiculated left lower lobe nodule is unchanged and highly
worrisome for primary bronchogenic carcinoma.
2.  Aortic atherosclerosis ([id number].N).
3.  Emphysema (4GO52-3FE.W).

## 2023-02-11 ENCOUNTER — Other Ambulatory Visit: Payer: Self-pay | Admitting: Emergency Medicine

## 2023-03-21 ENCOUNTER — Other Ambulatory Visit: Payer: Self-pay

## 2023-03-21 DIAGNOSIS — Z79899 Other long term (current) drug therapy: Secondary | ICD-10-CM

## 2023-03-21 DIAGNOSIS — B20 Human immunodeficiency virus [HIV] disease: Secondary | ICD-10-CM

## 2023-03-21 DIAGNOSIS — Z113 Encounter for screening for infections with a predominantly sexual mode of transmission: Secondary | ICD-10-CM

## 2023-03-23 IMAGING — DX DG CHEST 1V PORT
2 series · 2 of 2 positions shown · non-contrast
Comparison: 02/25/2021 CT

CLINICAL DATA: Status post bronchoscopy with left lower lobe
biopsy.

EXAM:
PORTABLE CHEST 1 VIEW

[chest ap (1 of 2)]
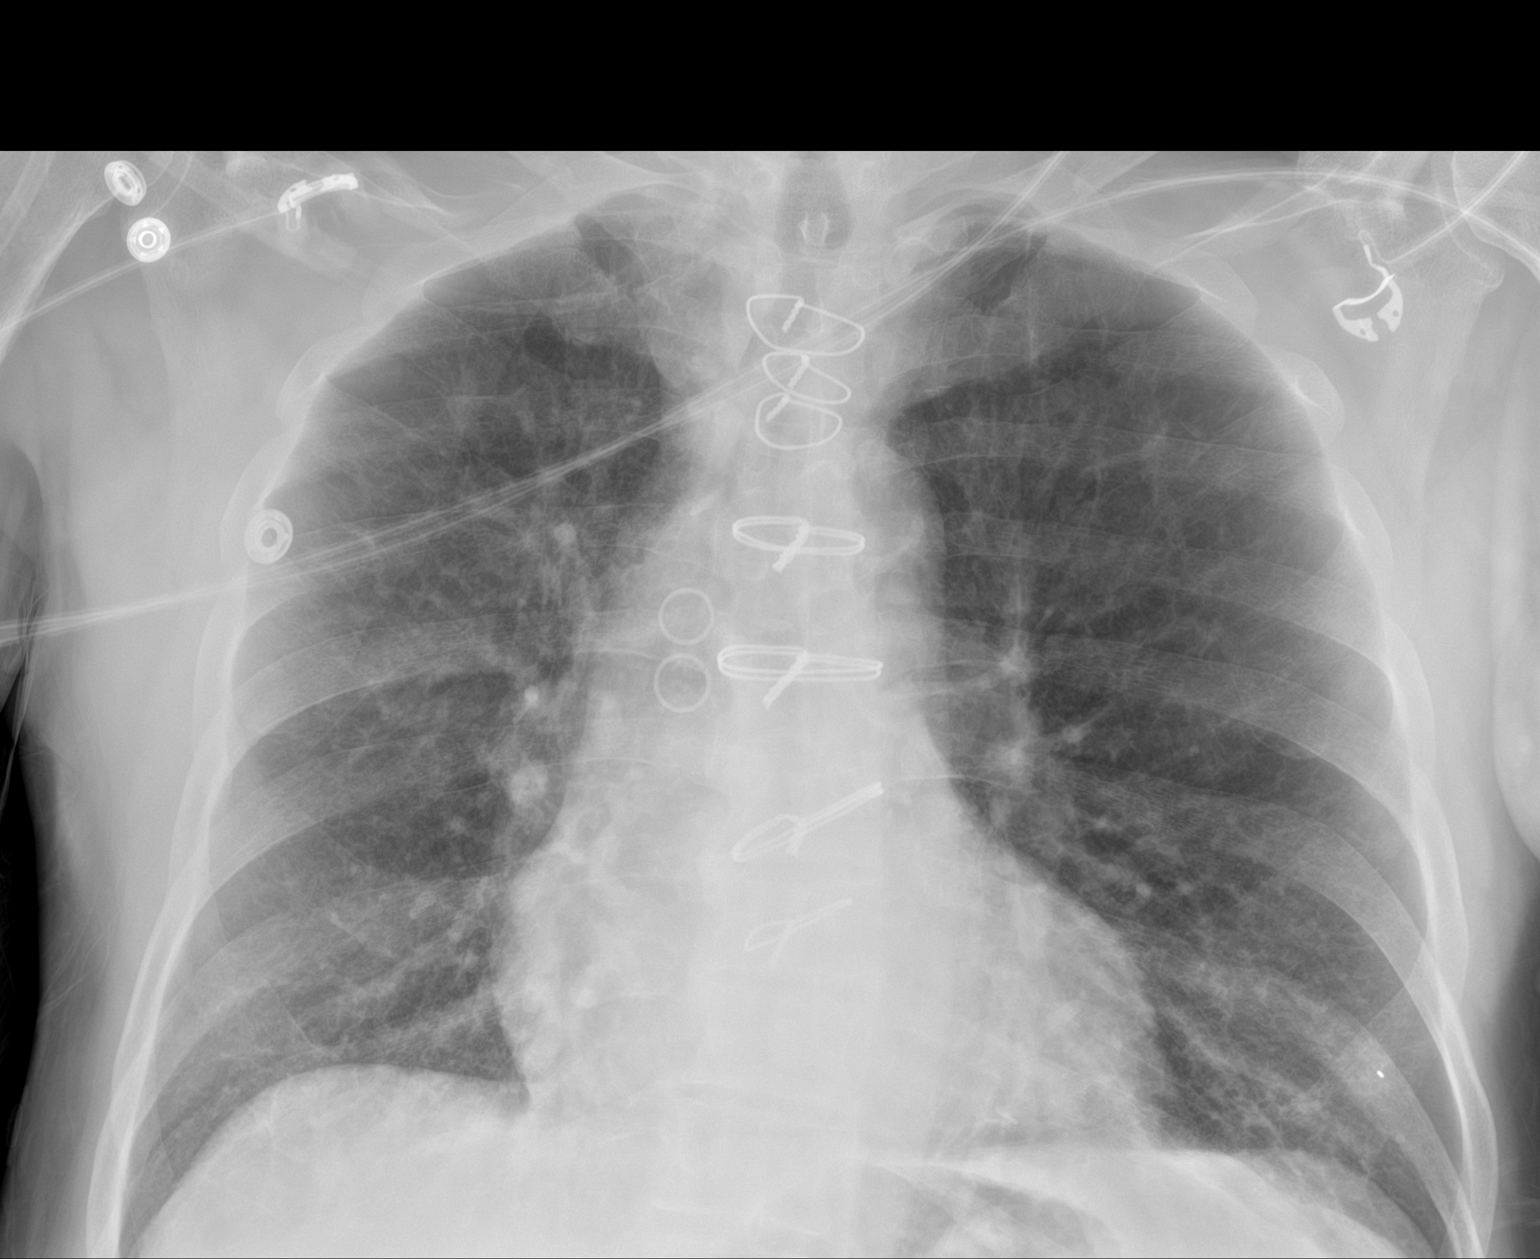

[chest ap (2 of 2)]
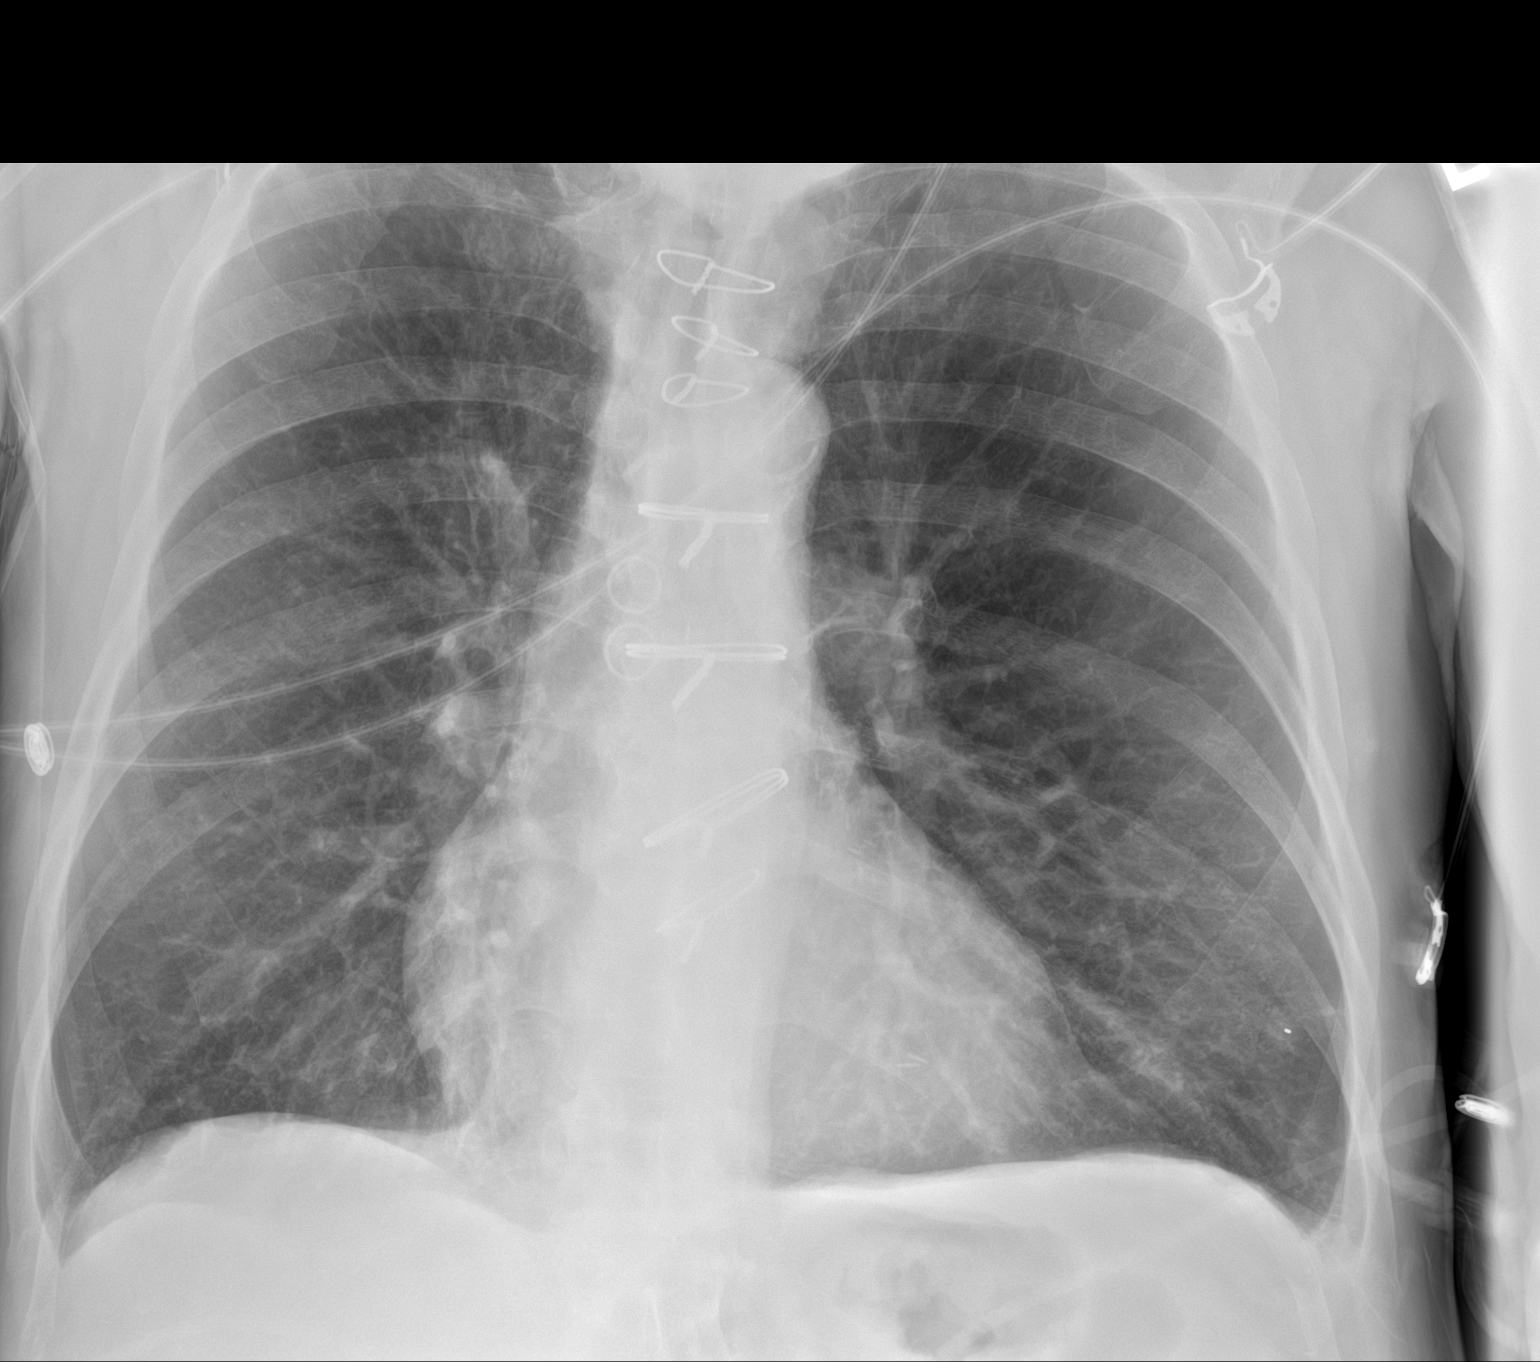

[2 of 2 positions shown; findings below may reference images not displayed]

FINDINGS: Prior median sternotomy. Mild cardiomegaly. No pleural effusion or
pneumothorax. Midline trachea. Atherosclerosis in the transverse
aorta. Diffuse interstitial thickening. No lobar consolidation.
Fiducial projects over the known left lower lobe pulmonary nodule.
Left base scarring.
IMPRESSION: No pneumothorax or other acute complication after bronchoscopy.

Interstitial thickening is consistent with COPD/chronic bronchitis.

## 2023-03-27 ENCOUNTER — Other Ambulatory Visit: Payer: Self-pay

## 2023-03-27 ENCOUNTER — Other Ambulatory Visit: Payer: Medicare HMO

## 2023-03-27 DIAGNOSIS — B20 Human immunodeficiency virus [HIV] disease: Secondary | ICD-10-CM

## 2023-03-27 DIAGNOSIS — Z79899 Other long term (current) drug therapy: Secondary | ICD-10-CM

## 2023-03-27 DIAGNOSIS — Z113 Encounter for screening for infections with a predominantly sexual mode of transmission: Secondary | ICD-10-CM

## 2023-03-28 LAB — T-HELPER CELL (CD4) - (RCID CLINIC ONLY)
CD4 % Helper T Cell: 28 % — ABNORMAL LOW (ref 33–65)
CD4 T Cell Abs: 492 /uL (ref 400–1790)

## 2023-03-29 LAB — COMPLETE METABOLIC PANEL WITH GFR
AG Ratio: 1.6 (calc) (ref 1.0–2.5)
ALT: 18 U/L (ref 9–46)
AST: 18 U/L (ref 10–35)
Albumin: 4.7 g/dL (ref 3.6–5.1)
Alkaline phosphatase (APISO): 73 U/L (ref 35–144)
BUN: 14 mg/dL (ref 7–25)
CO2: 28 mmol/L (ref 20–32)
Calcium: 10.3 mg/dL (ref 8.6–10.3)
Chloride: 103 mmol/L (ref 98–110)
Creat: 1.21 mg/dL (ref 0.70–1.28)
Globulin: 2.9 g/dL (ref 1.9–3.7)
Glucose, Bld: 87 mg/dL (ref 65–99)
Potassium: 4.3 mmol/L (ref 3.5–5.3)
Sodium: 138 mmol/L (ref 135–146)
Total Bilirubin: 0.7 mg/dL (ref 0.2–1.2)
Total Protein: 7.6 g/dL (ref 6.1–8.1)
eGFR: 64 mL/min/{1.73_m2} (ref >=60)

## 2023-03-29 LAB — LIPID PANEL
Cholesterol: 129 mg/dL (ref <200)
HDL: 48 mg/dL (ref >=40)
LDL Cholesterol (Calc): 55 mg/dL
Non-HDL Cholesterol (Calc): 81 mg/dL (ref <130)
Total CHOL/HDL Ratio: 2.7 (calc) (ref <5.0)
Triglycerides: 190 mg/dL — ABNORMAL HIGH (ref <150)

## 2023-03-29 LAB — CBC WITH DIFFERENTIAL/PLATELET
Absolute Lymphocytes: 1901 {cells}/uL (ref 850–3900)
Absolute Monocytes: 1062 {cells}/uL — ABNORMAL HIGH (ref 200–950)
Basophils Absolute: 66 {cells}/uL (ref 0–200)
Basophils Relative: 0.8 %
Eosinophils Absolute: 232 {cells}/uL (ref 15–500)
Eosinophils Relative: 2.8 %
HCT: 49.4 % (ref 38.5–50.0)
Hemoglobin: 16.9 g/dL (ref 13.2–17.1)
MCH: 35.5 pg — ABNORMAL HIGH (ref 27.0–33.0)
MCHC: 34.2 g/dL (ref 32.0–36.0)
MCV: 103.8 fL — ABNORMAL HIGH (ref 80.0–100.0)
MPV: 9.8 fL (ref 7.5–12.5)
Monocytes Relative: 12.8 %
Neutro Abs: 5038 {cells}/uL (ref 1500–7800)
Neutrophils Relative %: 60.7 %
Platelets: 249 10*3/uL (ref 140–400)
RBC: 4.76 10*6/uL (ref 4.20–5.80)
RDW: 12.1 % (ref 11.0–15.0)
Total Lymphocyte: 22.9 %
WBC: 8.3 10*3/uL (ref 3.8–10.8)

## 2023-03-29 LAB — HIV-1 RNA QUANT-NO REFLEX-BLD
HIV 1 RNA Quant: <20 {copies}/mL — ABNORMAL HIGH
HIV-1 RNA Quant, Log: <1.3 {Log_copies}/mL — ABNORMAL HIGH

## 2023-03-29 LAB — RPR: RPR Ser Ql: NONREACTIVE

## 2023-04-10 ENCOUNTER — Encounter: Payer: Self-pay | Admitting: Internal Medicine

## 2023-04-10 ENCOUNTER — Other Ambulatory Visit: Payer: Self-pay

## 2023-04-10 ENCOUNTER — Ambulatory Visit (INDEPENDENT_AMBULATORY_CARE_PROVIDER_SITE_OTHER): Payer: Medicare HMO | Admitting: Internal Medicine

## 2023-04-10 VITALS — BP 135/71 | HR 58 | Ht 66.5 in | Wt 136.0 lb

## 2023-04-10 DIAGNOSIS — N183 Chronic kidney disease, stage 3 unspecified: Secondary | ICD-10-CM | POA: Diagnosis not present

## 2023-04-10 DIAGNOSIS — N189 Chronic kidney disease, unspecified: Secondary | ICD-10-CM

## 2023-04-10 DIAGNOSIS — B20 Human immunodeficiency virus [HIV] disease: Secondary | ICD-10-CM | POA: Diagnosis not present

## 2023-04-10 DIAGNOSIS — Z113 Encounter for screening for infections with a predominantly sexual mode of transmission: Secondary | ICD-10-CM | POA: Insufficient documentation

## 2023-04-10 DIAGNOSIS — N1831 Chronic kidney disease, stage 3a: Secondary | ICD-10-CM

## 2023-04-10 MED ORDER — BIKTARVY 50-200-25 MG PO TABS: 1.0000 | ORAL_TABLET | Freq: Every day | ORAL | 11 refills | Status: DC

## 2023-04-10 NOTE — Assessment & Plan Note (Signed)
Screened negative, low risk.

## 2023-04-10 NOTE — Progress Notes (Signed)
   Subjective:    Patient ID: [name] Kim, male    DOB: 1950-10-10, 72 y.o.   MRN: [id number]  HPI Rondey is here for follow up of HIV He is seen with his partner of nearly 50 years and doing well.  No issues with Biktarvy.  No issues with getting or taking his medication.     Review of Systems  Constitutional:  Negative for fatigue.  Gastrointestinal:  Negative for diarrhea and nausea.  Skin:  Negative for rash.       Objective:   Physical Exam Eyes:     General: No scleral icterus. Pulmonary:     Effort: Pulmonary effort is normal.  Neurological:     Mental Status: He is alert.   SH: no tobacco        Assessment & Plan:

## 2023-04-10 NOTE — Assessment & Plan Note (Signed)
Stable renal function, no new concerns.  Reviewed with him.

## 2023-04-10 NOTE — Assessment & Plan Note (Signed)
He continues to do well, no changes indicated and refills sent in.  Labs reviewed and can follow up in 11 months.

## 2023-04-21 ENCOUNTER — Other Ambulatory Visit: Payer: Self-pay | Admitting: Cardiovascular Disease

## 2023-05-02 ENCOUNTER — Ambulatory Visit
Admission: RE | Admit: 2023-05-02 | Discharge: 2023-05-02 | Disposition: A | Discharge status: Home / Self Care | Payer: Medicare HMO | Source: Ambulatory Visit | Attending: Emergency Medicine | Admitting: Emergency Medicine

## 2023-05-02 DIAGNOSIS — R911 Solitary pulmonary nodule: Secondary | ICD-10-CM | POA: Diagnosis present

## 2023-06-20 ENCOUNTER — Encounter: Payer: Self-pay | Admitting: Emergency Medicine

## 2023-06-20 ENCOUNTER — Ambulatory Visit: Payer: Medicare HMO | Admitting: Emergency Medicine

## 2023-06-20 VITALS — BP 138/62 | HR 61 | Temp 97.5°F | Ht 66.5 in | Wt 138.6 lb

## 2023-06-20 DIAGNOSIS — J449 Chronic obstructive pulmonary disease, unspecified: Secondary | ICD-10-CM | POA: Diagnosis not present

## 2023-06-20 DIAGNOSIS — C3492 Malignant neoplasm of unspecified part of left bronchus or lung: Secondary | ICD-10-CM

## 2023-06-20 DIAGNOSIS — R9389 Abnormal findings on diagnostic imaging of other specified body structures: Secondary | ICD-10-CM

## 2023-06-20 MED ORDER — ALBUTEROL SULFATE HFA 108 (90 BASE) MCG/ACT IN AERS: 2.0000 | INHALATION_SPRAY | RESPIRATORY_TRACT | 6 refills | Status: DC | PRN

## 2023-06-20 NOTE — Progress Notes (Signed)
   Subjective:    Patient ID: [name] Kim, male    DOB: Feb 22, 1951, 73 y.o.   MRN: [id number]   HPI  ROV 06/20/23 --Mr. Aldrete is 61 with a history of COPD, HIV, CAD/CABG, partial seizures.  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  I have been following him for this as well as COPD.  He remains on Anoro. He stays active, maybe less so during the cold months. No flares, no pred or abx since June 2024. Minimal cough or wheeze. He uses albuterol about few times a week, does get benefit.  He can get winded when lifting something heavy. Remains on loratadine.   CT scan of the chest 05/01/2022 reviewed by me, showed stable surgical changes from his left lower lobe wedge resection without any evidence of recurrence.  Stable emphysematous changes.  There is new subpleural density in the anterior aspect of the right middle lobe consistent with possible rounded atelectasis.  There is another elongated opacity in the right lower lobe 16 mm that is similar.  Recommendation was made for repeat CT chest in 3 to 4 months   Review of Systems As per HPI     Objective:   Physical Exam Vitals:   06/20/23 0853  BP: 138/62  Pulse: 61  Temp: (!) 97.5 F (36.4 C)  TempSrc: Temporal  SpO2: 97%  Weight: 138 lb 9.6 oz (62.9 kg)  Height: 5' 6.5" (1.689 m)    Gen: Pleasant, well-nourished, in no distress,  normal affect  ENT: No lesions,  mouth clear,  oropharynx clear, no postnasal drip  Neck: No JVD, no stridor  Lungs: No use of accessory muscles, no crackles or wheezing on normal respiration, no wheeze on forced expiration  Cardiovascular: RRR, heart sounds normal, no murmur or gallops, no peripheral edema  Musculoskeletal: No deformities, no cyanosis or clubbing  Neuro: alert, awake, non focal  Skin: Warm, no lesions or rash      Assessment & Plan:   COPD (chronic obstructive pulmonary disease) (HCC) Overall doing well.  He did have an exacerbation in  June 2024.  Stable functional capacity.  Good medical regimen, Anoro.  Minimal albuterol use.  We will refill this for him today.  If he does show [place] evidence for increased exacerbation frequency then we could consider adding ICS to his regimen.  Squamous cell lung cancer, left (HCC) No evidence of recurrence on his CT scan of the chest.  He did have some subsegmental/subpleural opacity on the right which was new, question rounded atelectasis.  We will repeat his CT scan of the chest early to do appropriate surveillance.  Plan for a CT in March      Levy Pupa, MD, PhD 06/20/2023, 9:29 AM [facility] [phone] or if no answer before 7:00PM call [phone] For any issues after 7:00PM please call eLink [phone]

## 2023-06-20 NOTE — Patient Instructions (Signed)
We reviewed your CT scan of the chest today. We will plan to repeat a surveillance CT chest in mid March 2025 Please continue your Anoro once daily. Keep your albuterol available use 2 puffs when needed for shortness of breath, chest tightness, wheezing.  We will refill this for you today Continue loratadine once daily. Follow Dr. Delton Coombes in March after your CT chest so we can review those results together

## 2023-06-20 NOTE — Assessment & Plan Note (Signed)
Overall doing well.  He did have an exacerbation in June 2024.  Stable functional capacity.  Good medical regimen, Anoro.  Minimal albuterol use.  We will refill this for him today.  If he does show [place] evidence for increased exacerbation frequency then we could consider adding ICS to his regimen.

## 2023-06-20 NOTE — Assessment & Plan Note (Signed)
No evidence of recurrence on his CT scan of the chest.  He did have some subsegmental/subpleural opacity on the right which was new, question rounded atelectasis.  We will repeat his CT scan of the chest early to do appropriate surveillance.  Plan for a CT in March

## 2023-07-04 ENCOUNTER — Other Ambulatory Visit: Payer: Self-pay | Admitting: Cardiovascular Disease

## 2023-07-11 ENCOUNTER — Other Ambulatory Visit: Payer: Self-pay | Admitting: Emergency Medicine

## 2023-07-17 ENCOUNTER — Telehealth: Payer: Self-pay | Admitting: Internal Medicine

## 2023-07-17 NOTE — Telephone Encounter (Signed)
 PT MAILED [place] a copy of a letter from [name] addressed to him about a medication Anoro Ellipta. Letter states he needs to talk to his prescriber about options. I put it in Dr. Kavin Leech box.

## 2023-07-19 ENCOUNTER — Other Ambulatory Visit: Payer: Self-pay | Admitting: Emergency Medicine

## 2023-07-19 ENCOUNTER — Telehealth: Payer: Self-pay | Admitting: Emergency Medicine

## 2023-07-19 NOTE — Telephone Encounter (Signed)
 [name] Kim states Stiolto Respimat is covered by [employer]. Pharmacy is [facility]. [place]. [name] Kim phone number is [phone]. Clinical Review phone number is [phone].

## 2023-07-20 MED ORDER — STIOLTO RESPIMAT 2.5-2.5 MCG/ACT IN AERS: 2.0000 | INHALATION_SPRAY | Freq: Every day | RESPIRATORY_TRACT | 1 refills | Status: DC

## 2023-07-20 NOTE — Telephone Encounter (Signed)
 Pt is on Anoro. Please advise about the [employer]

## 2023-07-20 NOTE — Telephone Encounter (Signed)
 It would be fine to change him to Stiolto 2 puffs once daily if he is willing to make that change.

## 2023-07-20 NOTE — Telephone Encounter (Signed)
 I called and spoke with pt. Pt is fine with switching to [employer]. Sending rx to pharmacy now. Nfn

## 2023-07-23 ENCOUNTER — Ambulatory Visit
Admission: RE | Admit: 2023-07-23 | Discharge: 2023-07-23 | Disposition: A | Discharge status: Home / Self Care | Payer: Medicare HMO | Source: Ambulatory Visit | Attending: Emergency Medicine | Admitting: Emergency Medicine

## 2023-07-23 DIAGNOSIS — R9389 Abnormal findings on diagnostic imaging of other specified body structures: Secondary | ICD-10-CM | POA: Insufficient documentation

## 2023-08-08 ENCOUNTER — Ambulatory Visit: Payer: Medicare HMO | Admitting: Emergency Medicine

## 2023-08-08 ENCOUNTER — Encounter: Payer: Self-pay | Admitting: Emergency Medicine

## 2023-08-08 VITALS — BP 128/60 | HR 62 | Ht 66.5 in | Wt 133.8 lb

## 2023-08-08 DIAGNOSIS — J449 Chronic obstructive pulmonary disease, unspecified: Secondary | ICD-10-CM | POA: Diagnosis not present

## 2023-08-08 DIAGNOSIS — C3492 Malignant neoplasm of unspecified part of left bronchus or lung: Secondary | ICD-10-CM

## 2023-08-08 NOTE — Patient Instructions (Signed)
 We reviewed your CT scan of the chest today.  The right middle lobe opacity is unchanged in size or appearance.  The right lower lobe opacity has almost fully resolved.  Good news. We will plan to repeat your CT scan of the chest in June 2025 to ensure stability. Please go ahead and try Stiolto 2 puffs once daily after your Anoro runs out.  If the Stiolto is too difficult to take, is less effective, then please let us know and we will consider doing a prior authorization request for the Anoro with your insurance company. Keep albuterol available to use 2 puffs up to every 4 hours if needed for shortness of breath, chest tightness, wheezing.  Follow Dr. Delton Coombes in June after your CT chest so we can review those results together.

## 2023-08-08 NOTE — Progress Notes (Signed)
 Subjective:    Patient ID: [name] Kim, male    DOB: Sep 26, 1950, 73 y.o.   MRN: [id number]   HPI  ROV 06/20/23 --[name] Kim is 73 with a history of COPD, HIV, CAD/CABG, partial seizures.  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  I have been following him for this as well as COPD.  He remains on Anoro. He stays active, maybe less so during the cold months. No flares, no pred or abx since June 2024. Minimal cough or wheeze. He uses albuterol about few times a week, does get benefit.  He can get winded when lifting something heavy. Remains on loratadine.   CT scan of the chest 05/02/2023 reviewed by me, showed stable surgical changes from his left lower lobe wedge resection without any evidence of recurrence.  Stable emphysematous changes.  There is new subpleural density in the anterior aspect of the right middle lobe consistent with possible rounded atelectasis.  There is another elongated opacity in the right lower lobe 16 mm that is similar.  Recommendation was made for repeat CT chest in 3 to 4 months  RV 08/08/2023 --pleasant 73 year old gentleman with a history of COPD, HIV, CAD/CABG, partial seizures, left lower lobe squamous cell lung cancer that was resected surgically.  Since last time we talked about changing the Anoro to [employer].  He still on Anoro for now because he had some left. He prefers the Anoro, better delivery, but is going to try the [employer].   His CT scan of the chest shows emphysema, a subpleural anterior right middle lobe area of rounded atelectasis and elongated opacity in the right lower lobe.  This prompted [place] to repeat his CT on 07/23/2023.  I have reviewed and it shows that the subpleural anterior right middle lobe area is stable in size and appearance.  The opacity in the right lower lobe has almost resolved completely.   Review of Systems As per HPI     Objective:   Physical Exam Vitals:   08/08/23 0853  BP: 128/60  Pulse: 62   SpO2: 98%  Weight: 133 lb 12.8 oz (60.7 kg)  Height: 5' 6.5" (1.689 m)    Gen: Pleasant, well-nourished, in no distress,  normal affect  ENT: No lesions,  mouth clear,  oropharynx clear, no postnasal drip  Neck: No JVD, no stridor  Lungs: No use of accessory muscles, no crackles or wheezing on normal respiration, no wheeze on forced expiration  Cardiovascular: RRR, heart sounds normal, no murmur or gallops, no peripheral edema  Musculoskeletal: No deformities, no cyanosis or clubbing  Neuro: alert, awake, non focal  Skin: Warm, no lesions or rash      Assessment & Plan:   Squamous cell lung cancer, left (HCC) Following series of teas for interval stability.  His most recent CT showed a subpleural anterior right middle lobe area of rounded atelectasis and a right lower lobe opacity.  Repeat CT 07/23/2023 showed resolution of the right lower lobe opacity, stability of the right middle lobe rounded area of presumed atelectasis.  Plan to continue surveillance.  We reviewed your CT scan of the chest today.  The right middle lobe opacity is unchanged in size or appearance.  The right lower lobe opacity has almost fully resolved.  Good news. We will plan to repeat your CT scan of the chest in June 2025 to ensure stability. Follow Dr. Delton Coombes in June after your CT chest so we can review those  results together.   COPD (chronic obstructive pulmonary disease) (HCC) Feels well at this time.  He has not changed the Anoro to [employer] yet, still has Anoro in stock.  No evidence of flaring at this time.  Please go ahead and try Stiolto 2 puffs once daily after your Anoro runs out.  If the Stiolto is too difficult to take, is less effective, then please let us know and we will consider doing a prior authorization request for the Anoro with your insurance company. Keep albuterol available to use 2 puffs up to every 4 hours if needed for shortness of breath, chest tightness, wheezing.     Time  spent 31 minutes   [name] Pupa, MD, PhD 08/08/2023, 6:30 PM [facility] [phone] or if no answer before 7:00PM call [phone] For any issues after 7:00PM please call eLink [phone]

## 2023-08-08 NOTE — Assessment & Plan Note (Signed)
 Feels well at this time.  He has not changed the Anoro to [employer] yet, still has Anoro in stock.  No evidence of flaring at this time.  Please go ahead and try Stiolto 2 puffs once daily after your Anoro runs out.  If the Stiolto is too difficult to take, is less effective, then please let us know and we will consider doing a prior authorization request for the Anoro with your insurance company. Keep albuterol available to use 2 puffs up to every 4 hours if needed for shortness of breath, chest tightness, wheezing.

## 2023-08-08 NOTE — Assessment & Plan Note (Signed)
 Following series of teas for interval stability.  His most recent CT showed a subpleural anterior right middle lobe area of rounded atelectasis and a right lower lobe opacity.  Repeat CT 07/23/2023 showed resolution of the right lower lobe opacity, stability of the right middle lobe rounded area of presumed atelectasis.  Plan to continue surveillance.  We reviewed your CT scan of the chest today.  The right middle lobe opacity is unchanged in size or appearance.  The right lower lobe opacity has almost fully resolved.  Good news. We will plan to repeat your CT scan of the chest in June 2025 to ensure stability. Follow Dr. Delton Coombes in June after your CT chest so we can review those results together.

## 2023-08-31 ENCOUNTER — Ambulatory Visit: Payer: Self-pay

## 2023-08-31 NOTE — Telephone Encounter (Signed)
 Spoke with [name] Kim. He has starting using stiolto inhaler. Developing dry cough & uncomfortable back pain. Using for ab 2 week, and if Dr. Delton Coombes prefers pt is fine with trying to continue it to see if symtoms calm down. He has tried bevespi before as an alternative, it sent pt to hospital.  I am going to go ahead and route to pharmacy for prior auth for [employer] inhaler.  Please advise pharmacy & Dr. Delton Coombes

## 2023-08-31 NOTE — Telephone Encounter (Signed)
 Copied from CRM [id number]. Topic: Clinical - Prescription Issue >> Aug 31, 2023  9:11 AM Isabell A wrote: Reason for CRM: Patient states he was on Anoro - he was put on Stiloto. Patient is stating the Linus Orn is not helping, causing him breathless, back pain & anxious.  E2C2 Pulmonary Triage - Initial Assessment Questions "Chief Complaint (e.g., cough, sob, wheezing, fever, chills, sweat or additional symptoms) *Go to specific symptom protocol after initial questions.  Medication Issue- Linus Orn is causing an intermittent cough, upper back pain (3/10), anxiousness, and dyspnea. Denies any symptoms during telephonic triage.   "How long have symptoms been present?"  Since shortly after starting the Stiloto  Have you tested for COVID or Flu? Note: If not, ask patient if a home test can be taken. If so, instruct patient to call back for positive results. No  MEDICINES:   "Have you used any OTC meds to help with symptoms?" No If yes, ask "What medications?" No  "Have you used your inhalers/maintenance medication?" Yes If yes, "What medications?"  STILOTO Albuterol (VENTOLIN HFA)   If inhaler, ask "How many puffs and how often?" Note: Review instructions on medication in the chart.  Taking medications exactly as prescribed.   Reason for Disposition  [1] Caller has URGENT medicine question about med that PCP or specialist prescribed AND [2] triager unable to answer question  Answer Assessment - Initial Assessment Questions 1. NAME of MEDICINE: "What medicine(s) are you calling about?"     STILOTO  2. QUESTION: "What is your question?" (e.g., double dose of medicine, side effect)     Previously on Anoro, now on STILOTO,  3. PRESCRIBER: "Who prescribed the medicine?" Reason: if prescribed by specialist, call should be referred to that group.     Dr. Delton Coombes  4. SYMPTOMS: "Do you have any symptoms?" If Yes, ask: "What symptoms are you having?"  "How bad are the symptoms (e.g., mild,  moderate, severe)     Intermittent Cough, Upper Back Pain, Anxiousness, and Dyspnea  Protocols used: Medication Question Call-A-AH

## 2023-08-31 NOTE — Telephone Encounter (Signed)
 Agree stop the stiolto.  Ok to try anoro if pt agrees

## 2023-09-03 ENCOUNTER — Telehealth: Payer: Self-pay | Admitting: Cardiovascular Disease

## 2023-09-03 NOTE — Telephone Encounter (Signed)
   Name: [name] Kim  DOB: 10/27/1950  MRN: [id number]  Primary Cardiologist: Belva Boyden, MD  Chart reviewed as part of pre-operative protocol coverage. The patient has an upcoming visit scheduled with Dr. Gollan on 10/01/2023 at which time clearance can be addressed in case there are any issues that would impact surgical recommendations.  Colonoscopy is not scheduled until 10/18/2023 as below. I added preop FYI to appointment note so that provider is aware to address at time of outpatient visit.  Per office protocol the cardiology provider should forward their finalized clearance decision and recommendations regarding antiplatelet therapy to the requesting party below.    No medications noted to be held for upcoming procedure.  I will route this message as FYI to requesting party and remove this message from the preop box as separate preop APP input not needed at this time.   Please call with any questions.  Ava Boatman, NP  09/03/2023, 11:57 AM

## 2023-09-03 NOTE — Telephone Encounter (Signed)
   Pre-operative Risk Assessment    Patient Name: [name] Kim  DOB: [birth year] MRN: [id number]   Date of last office visit: unknown Date of next office visit: 10-01-23   Request for Surgical Clearance    Procedure:   colonoscopy  Date of Surgery:  Clearance 10/18/23                                Surgeon:  Dr. Cheral Cordoba Group or Practice Name:  [facility] Phone number:  [phone] Fax number:  [phone]   Type of Clearance Requested:   - Medical    Type of Anesthesia:   propofol   Additional requests/questions:    SignedGeroldine Kotyk   09/03/2023, 11:28 AM

## 2023-09-04 ENCOUNTER — Other Ambulatory Visit (HOSPITAL_COMMUNITY): Payer: Self-pay

## 2023-09-04 ENCOUNTER — Telehealth: Payer: Self-pay

## 2023-09-04 NOTE — Telephone Encounter (Signed)
*  Pulm  Pharmacy Patient Advocate Encounter  Received notification from [employer] that Prior Authorization for Anoro Ellipta 62.5-25MCG/ACT aerosol powder  has been APPROVED from 09/04/2023 to 05/21/2024. Ran test claim, Copay is $0.00. This test claim was processed through [facility]- copay amounts may vary at other pharmacies due to pharmacy/plan contracts, or as the patient moves through the different stages of their insurance plan.   PA #/Case ID/Reference #: Leonides Ramp

## 2023-09-05 MED ORDER — ANORO ELLIPTA 62.5-25 MCG/ACT IN AEPB: 1.0000 | INHALATION_SPRAY | Freq: Every day | RESPIRATORY_TRACT | 6 refills | Status: AC

## 2023-09-05 NOTE — Telephone Encounter (Signed)
 ATC x1. LDVM Anoro has been sent to pharmacy as pt was wanting to try it last tp call. NFN

## 2023-09-18 ENCOUNTER — Telehealth: Payer: Self-pay | Admitting: Cardiovascular Disease

## 2023-09-18 NOTE — Telephone Encounter (Signed)
 Left voicemail, pt advised that appt on 5/12 may need to be rescheduled due to provider possibly being out of office.

## 2023-09-28 ENCOUNTER — Ambulatory Visit: Admitting: Cardiovascular Disease

## 2023-09-30 NOTE — Progress Notes (Unsigned)
 Cardiology Office Note  Date:  10/01/2023   ID:  Cesareo, Sotolongo 1951/05/08, MRN [id number]  PCP:  Alfredia Ina, MD   Chief Complaint  Patient presents with   12 month follow up     "Doing well." Cardiac clearance for a colonoscopy for May 29 th.     HPI:  Mr. Trimmer is a pleasant 73 year-old gentleman with a history of  coronary artery disease,  bypass surgery in 2007,  chronic HIV infection on antiviral therapy, Long history of smoking, Centrilobular emphysema Small AAA, 3.2 cm in March 2024 Squamous cell lung cancer on left, surgical resection at [facility] March 2023 Chronic kidney disease stage IIa who presents for routine follow-up of his coronary artery disease, CABG  Last seen March 2024 Had colo last year, 13 polyps Needs another colo this year  Reports that he feels well with no complaints Walking with no regular exercise program Active in the garden Denies significant gait instability  History of Asymptomatic bradycardia, not on beta-blockers or AV nodal blocking agents  Denies chest pain or shortness of breath on exertion concerning for angina  Lab work reviewed Total cholesterol 129 LDL 55 Creatinine 1.2  EKG personally reviewed by myself on todays visit EKG Interpretation Date/Time:  Monday Oct 01 2023 08:44:41 EDT Ventricular Rate:  70 PR Interval:  152 QRS Duration:  90 QT Interval:  396 QTC Calculation: 427 R Axis:   50  Text Interpretation: Sinus rhythm with marked sinus arrhythmia Septal infarct , age undetermined When compared with ECG of 20-Nov-2022 03:22, No significant change was found Confirmed by Belva Boyden [id number]) on 10/01/2023 8:48:32 AM   Other past medical hx reviewed  abdominal aortic ultrasound showing small AAA dated 09/2017 Distal abdominal aorta measuring 3.6 cm in greatest dimension.  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  PSH:    Past Surgical History:  Procedure Laterality Date   APPENDECTOMY  1970   BRONCHIAL BIOPSY  05/31/2021   Procedure: BRONCHIAL BIOPSIES;  Surgeon: Denson Flake, MD;  Location: [facility] ENDOSCOPY;  Service: Pulmonary;;   BRONCHIAL BRUSHINGS  05/31/2021   Procedure: BRONCHIAL BRUSHINGS;  Surgeon: Denson Flake, MD;  Location: [facility] ENDOSCOPY;  Service: Pulmonary;;   BRONCHIAL NEEDLE ASPIRATION BIOPSY  05/31/2021   Procedure: BRONCHIAL NEEDLE ASPIRATION BIOPSIES;  Surgeon: Denson Flake, MD;  Location: [facility] ENDOSCOPY;  Service: Pulmonary;;   CATARACT EXTRACTION Bilateral    CATARACT EXTRACTION W/PHACO Right 02/03/2020   Procedure: CATARACT EXTRACTION PHACO AND INTRAOCULAR LENS PLACEMENT (IOC) RIGHT 4.30 00:28.6;  Surgeon: Clair Crews, MD;  Location: [facility];  Service: Ophthalmology;  Laterality: Right;   CATARACT EXTRACTION W/PHACO Left 02/24/2020   Procedure: CATARACT EXTRACTION PHACO AND INTRAOCULAR LENS PLACEMENT (IOC) LEFT 5.58 00:32.2;  Surgeon: Clair Crews, MD;  Location: [facility];  Service: Ophthalmology;  Laterality: Left;   CHOLECYSTECTOMY     COLONOSCOPY     CORONARY ARTERY BYPASS GRAFT  08/09/2005   LIMA-LAD, SVG-diagonal, SVG-OM   FIDUCIAL MARKER PLACEMENT  05/31/2021   Procedure: FIDUCIAL MARKER PLACEMENT;  Surgeon: Denson Flake, MD;  Location: [facility] ENDOSCOPY;  Service: Pulmonary;;   TONSILECTOMY, ADENOIDECTOMY, BILATERAL MYRINGOTOMY AND TUBES  08/1964  VIDEO BRONCHOSCOPY WITH RADIAL ENDOBRONCHIAL ULTRASOUND  05/31/2021   Procedure: VIDEO BRONCHOSCOPY WITH RADIAL ENDOBRONCHIAL ULTRASOUND;  Surgeon: Denson Flake, MD;  Location: [facility] ENDOSCOPY;  Service: Pulmonary;;     Current Outpatient Medications  Medication Sig Dispense Refill   albuterol  (VENTOLIN  HFA) 108 (90 Base) MCG/ACT inhaler INHALE 1 PUFF INTO THE LUNGS EVERY 6 HOURS AS NEEDED FOR WHEEZING OR SHORTNESS OF BREATH 6.7 g 6   aspirin  81 MG tablet Take 81 mg by mouth daily.     atorvastatin  (LIPITOR) 20 MG tablet TAKE 1 TABLET(20 MG) BY MOUTH DAILY AT 6 PM (Patient taking differently: Take 20 mg by mouth daily. TAKE 1 TABLET(20 MG) BY MOUTH DAILY AT 6 PM) 30 tablet 10   bictegravir-emtricitabine -tenofovir  AF (BIKTARVY ) 50-200-25 MG TABS tablet Take 1 tablet by mouth daily. 30 tablet 11   ezetimibe  (ZETIA ) 10 MG tablet TAKE 1 TABLET(10 MG) BY MOUTH DAILY (Patient taking differently: Take 10 mg by mouth daily.) 90 tablet 3   folic acid  (FOLVITE ) 400 MCG tablet Take 400 mcg by mouth in the morning.     lamoTRIgine  (LAMICTAL ) 100 MG tablet Take 100 mg by mouth 2 (two) times daily.     omega-3 acid ethyl esters (LOVAZA ) 1 g capsule TAKE 1 CAPSULE BY MOUTH TWICE DAILY 180 capsule 0   Tiotropium Bromide-Olodaterol (STIOLTO RESPIMAT ) 2.5-2.5 MCG/ACT AERS Inhale 2 puffs into the lungs daily. 4 g 1   umeclidinium-vilanterol (ANORO ELLIPTA ) 62.5-25 MCG/ACT AEPB Inhale 1 puff into the lungs daily. 60 each 6   No current facility-administered medications for this visit.     Allergies:   Zidovudine   Social History:  The patient  reports that he quit smoking about 18 years ago. His smoking use included cigarettes. He has never used smokeless tobacco. He reports current alcohol use. He reports that he does not use drugs.   Family History:   family history includes Cancer in his father and mother.    Review of Systems: Review of Systems  Constitutional: Negative.   Respiratory: Negative.    Cardiovascular: Negative.   Gastrointestinal: Negative.   Musculoskeletal: Negative.   Neurological: Negative.   Psychiatric/Behavioral: Negative.    All other systems reviewed and are negative.   PHYSICAL  EXAM: VS:  BP (!) 124/54 (BP Location: Left Arm, Patient Position: Sitting, Cuff Size: Normal)   Pulse 70   Ht 5' 6.5" (1.689 m)   Wt 132 lb 2 oz (59.9 kg)   SpO2 96%   BMI 21.01 kg/m  , BMI Body mass index is 21.01 kg/m. Constitutional:  oriented to person, place, and time. No distress.  HENT:  Head: Grossly normal Eyes:  no discharge. No scleral icterus.  Neck: No JVD, no carotid bruits  Cardiovascular: Regular rate and rhythm, no murmurs appreciated Pulmonary/Chest: Clear to auscultation bilaterally, no wheezes or rales Abdominal: Soft.  no distension.  no tenderness.  Musculoskeletal: Normal range of motion Neurological:  normal muscle tone. Coordination normal. No atrophy Skin: Skin warm and dry Psychiatric: normal affect, pleasant   Recent Labs: 03/27/2023: ALT 18; BUN 14; Creat 1.21; Hemoglobin 16.9; Platelets 249; Potassium 4.3; Sodium 138    Lipid Panel Lab Results  Component Value Date   CHOL 129 03/27/2023   HDL 48 03/27/2023   LDLCALC 55 03/27/2023   TRIG 190 (H) 03/27/2023     Filed Weights   10/01/23 0838  Weight: 132 lb 2 oz (59.9 kg)    ASSESSMENT AND PLAN:  Coronary artery disease involving  native coronary artery of native heart without angina pectoris - Currently with no symptoms of angina. No further workup at this time. Continue current medication regimen. On Zetia  10 mg , Lipitor 20 cholesterol at goal  Human immunodeficiency virus (HIV) disease (HCC) Managed by ID, stable Denies significant side effects  Dyslipidemia Cholesterol is at goal on the current lipid regimen. No changes to the medications were made.  S/P CABG (coronary artery bypass graft) CABG greater than 17 years ago Non-smoker, no diabetes, cholesterol at goal Denies anginal symptoms  Essential hypertension Blood pressure is well controlled on today's visit. No changes made to the medications.     Orders Placed This Encounter  Procedures   EKG 12-Lead      Signed, Juanda Noon, M.D., Ph.D. 10/01/2023  [facility] [place], [place] [phone]

## 2023-10-01 ENCOUNTER — Ambulatory Visit: Attending: Cardiovascular Disease | Admitting: Cardiovascular Disease

## 2023-10-01 ENCOUNTER — Telehealth: Payer: Self-pay | Admitting: Cardiovascular Disease

## 2023-10-01 VITALS — BP 124/54 | HR 70 | Ht 66.5 in | Wt 132.1 lb

## 2023-10-01 DIAGNOSIS — Z87891 Personal history of nicotine dependence: Secondary | ICD-10-CM | POA: Diagnosis not present

## 2023-10-01 DIAGNOSIS — N189 Chronic kidney disease, unspecified: Secondary | ICD-10-CM

## 2023-10-01 DIAGNOSIS — I10 Essential (primary) hypertension: Secondary | ICD-10-CM

## 2023-10-01 DIAGNOSIS — B20 Human immunodeficiency virus [HIV] disease: Secondary | ICD-10-CM

## 2023-10-01 DIAGNOSIS — I25118 Atherosclerotic heart disease of native coronary artery with other forms of angina pectoris: Secondary | ICD-10-CM

## 2023-10-01 DIAGNOSIS — J449 Chronic obstructive pulmonary disease, unspecified: Secondary | ICD-10-CM

## 2023-10-01 DIAGNOSIS — Z951 Presence of aortocoronary bypass graft: Secondary | ICD-10-CM

## 2023-10-01 DIAGNOSIS — I714 Abdominal aortic aneurysm, without rupture, unspecified: Secondary | ICD-10-CM

## 2023-10-01 DIAGNOSIS — E785 Hyperlipidemia, unspecified: Secondary | ICD-10-CM

## 2023-10-01 NOTE — Telephone Encounter (Signed)
   Name: [name] Kim  DOB: Dec 15, 1950  MRN: [id number]   Primary Cardiologist: Belva Boyden, MD  Chart reviewed as part of pre-operative protocol coverage. JACEON RELPH was last seen on 10/01/2023 by Dr. Gollan.  He had no complaints of angina or shortness of breath.  He reported walking and being active in his garden.  Therefore, based on ACC/AHA guidelines, the patient would be an acceptable risk for the planned procedure without further cardiovascular testing.   I will route this recommendation to the requesting party via Epic fax function and remove from pre-op pool. Please call with questions.  Morey Ar, NP 10/01/2023, 4:32 PM

## 2023-10-01 NOTE — Progress Notes (Signed)
 The ASCVD Risk score (Arnett DK, et al., 2019) failed to calculate for the following reasons:   The valid total cholesterol range is 130 to 320 mg/dL  [name] Kim, BSN, RN

## 2023-10-01 NOTE — Patient Instructions (Signed)

## 2023-10-01 NOTE — Telephone Encounter (Signed)
   Pre-operative Risk Assessment    Patient Name: [name] Kim  DOB: 02-05-1951 MRN: [id number]   Date of last office visit: 10/01/23 Date of next office visit: n/a   Request for Surgical Clearance    Procedure:  colonoscopy  Date of Surgery:  Clearance 10/18/23                                Surgeon:  Molli Angelucci. Nickey Barn, MD Surgeon's Group or Practice Name:  [facility], [place] Phone number:  [phone] Fax number:  [phone]   Type of Clearance Requested:   - Medical    Type of Anesthesia:  Not Indicated   Additional requests/questions:    Signed, Gwendolyn H Slade   10/01/2023, 4:08 PM

## 2023-10-05 ENCOUNTER — Other Ambulatory Visit: Payer: Self-pay | Admitting: Emergency Medicine

## 2023-10-19 ENCOUNTER — Other Ambulatory Visit: Payer: Self-pay | Admitting: Cardiovascular Disease

## 2023-11-02 ENCOUNTER — Ambulatory Visit
Admission: RE | Admit: 2023-11-02 | Discharge: 2023-11-02 | Disposition: A | Discharge status: Home / Self Care | Source: Ambulatory Visit | Attending: Emergency Medicine | Admitting: Emergency Medicine

## 2023-11-02 DIAGNOSIS — C3492 Malignant neoplasm of unspecified part of left bronchus or lung: Secondary | ICD-10-CM | POA: Diagnosis present

## 2023-11-09 ENCOUNTER — Ambulatory Visit: Admitting: Emergency Medicine

## 2023-11-09 ENCOUNTER — Encounter: Payer: Self-pay | Admitting: Emergency Medicine

## 2023-11-09 VITALS — BP 133/79 | HR 65 | Ht 66.0 in | Wt 129.0 lb

## 2023-11-09 DIAGNOSIS — J449 Chronic obstructive pulmonary disease, unspecified: Secondary | ICD-10-CM | POA: Diagnosis not present

## 2023-11-09 DIAGNOSIS — J301 Allergic rhinitis due to pollen: Secondary | ICD-10-CM | POA: Diagnosis not present

## 2023-11-09 DIAGNOSIS — C3492 Malignant neoplasm of unspecified part of left bronchus or lung: Secondary | ICD-10-CM | POA: Diagnosis not present

## 2023-11-09 NOTE — Assessment & Plan Note (Signed)
 Continue same regimen

## 2023-11-09 NOTE — Patient Instructions (Signed)
 We reviewed your CT scan of the chest today.  This is stable compared with priors.  Good news. We will plan to repeat your CT in December 2025. Please continue generic Anoro once daily as you have been using it Keep your albuterol  available to use 2 puffs when needed for shortness of breath, chest tightness, wheezing. Please notify us  for any new respiratory symptoms. Follow Dr. Baldwin Levee in December after your CT chest so we can review those results together.

## 2023-11-09 NOTE — Assessment & Plan Note (Signed)
 We are following serial imaging.  No change noted in 47-month interval.  I think we can change to a 47-month interval, next scan to be done in December.  He does have a small amount of right middle lobe airways thickening and peripheral rounded atelectasis that remained stable.  No evidence of recurrence.

## 2023-11-09 NOTE — Assessment & Plan Note (Signed)
 He did not tolerate Stiolto but has been able to get back onto generic Anoro.  He is benefiting.  Plan to continue this.  Albuterol  as needed.  He will call me for new symptoms.  He has not had any flares.  Keep his vaccinations up-to-date.

## 2023-11-09 NOTE — Progress Notes (Signed)
 Subjective:    Patient ID: [name] Kim, male    DOB: [birth year], 73 y.o.   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  Since last time we talked about changing the Anoro to [employer].  He still on Anoro for now because he had some left. He prefers the Anoro, better delivery, but is going to try the [employer].   His CT scan of the chest shows emphysema, a subpleural anterior right middle lobe area of rounded atelectasis and elongated opacity in the right lower lobe.  This prompted us  to repeat his CT on 07/23/2023.  I have reviewed and it shows that the subpleural anterior right middle lobe area is stable in size and appearance.  The opacity in the right lower lobe has almost resolved completely.  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  Also with CAD/CABG, HIV and partial seizures.  We have been following serial CT scans.  He had an area of right lower lobe opacity on prior imaging that cleared in March.  There is some stable but persistent right middle lobe rounded atelectasis.  We had discussed changing his Anoro to Stiolto > he tried it but he had more SOB and also back pain. He was able to get back on the the generic anoro. He is using albuterol  with exertion when he is working in the yard. No real cough. He does have PND and clears his throat in the am. No pred, no flares.   CT chest 11/02/2023 reviewed by me, shows stable postoperative changes in the left lower lobe without any evidence of local recurrence or metastatic disease.  There is centrilobular emphysema and diffuse central airway thickening with some mucous plugging especially in the right middle lobe and lingula   Review of Systems As per HPI     Objective:   Physical Exam Vitals:    11/09/23 0855  BP: 133/79  Pulse: 65  SpO2: 96%  Weight: 129 lb (58.5 kg)  Height: 5' 6 (1.676 m)    Gen: Pleasant, well-nourished, in no distress,  normal affect  ENT: No lesions,  mouth clear,  oropharynx clear, no postnasal drip  Neck: No JVD, no stridor  Lungs: No use of accessory muscles, no crackles or wheezing on normal respiration, no wheeze on forced expiration  Cardiovascular: RRR, heart sounds normal, no murmur or gallops, no peripheral edema  Musculoskeletal: No deformities, no cyanosis or clubbing  Neuro: alert, awake, non focal  Skin: Warm, no lesions or rash      Assessment & Plan:   Squamous cell lung cancer, left (HCC) We are following serial imaging.  No change noted in 30-month interval.  I think we can change to a 11-month interval, next scan to be done in December.  He does have a small amount of right middle lobe airways thickening and peripheral rounded atelectasis that remained stable.  No evidence of recurrence.  Seasonal allergic rhinitis due to pollen Continue same regimen  COPD (chronic obstructive pulmonary disease) (HCC) He did not tolerate Stiolto but has been able to get back onto generic Anoro.  He is benefiting.  Plan to continue this.  Albuterol  as needed.  He will call me for new symptoms.  He has not had any flares.  Keep  his vaccinations up-to-date.     Racheal Buddle, MD, PhD 11/09/2023, 9:20 AM [facility] [phone] or if no answer before 7:00PM call [phone] For any issues after 7:00PM please call eLink [phone]

## 2023-11-15 ENCOUNTER — Other Ambulatory Visit: Payer: Self-pay | Admitting: Cardiovascular Disease

## 2023-12-12 ENCOUNTER — Other Ambulatory Visit: Payer: Self-pay | Admitting: Emergency Medicine

## 2024-01-19 ENCOUNTER — Other Ambulatory Visit: Payer: Self-pay | Admitting: Cardiovascular Disease

## 2024-02-22 ENCOUNTER — Other Ambulatory Visit: Payer: Self-pay

## 2024-02-22 DIAGNOSIS — Z113 Encounter for screening for infections with a predominantly sexual mode of transmission: Secondary | ICD-10-CM

## 2024-02-22 DIAGNOSIS — B20 Human immunodeficiency virus [HIV] disease: Secondary | ICD-10-CM

## 2024-02-22 DIAGNOSIS — Z79899 Other long term (current) drug therapy: Secondary | ICD-10-CM

## 2024-02-27 ENCOUNTER — Other Ambulatory Visit: Payer: Medicare HMO

## 2024-02-27 ENCOUNTER — Other Ambulatory Visit: Payer: Self-pay

## 2024-02-27 ENCOUNTER — Other Ambulatory Visit (HOSPITAL_COMMUNITY)
Admission: RE | Admit: 2024-02-27 | Discharge: 2024-02-27 | Disposition: A | Discharge status: Home / Self Care | Source: Ambulatory Visit | Attending: Infectious Diseases | Admitting: Infectious Diseases

## 2024-02-27 DIAGNOSIS — Z113 Encounter for screening for infections with a predominantly sexual mode of transmission: Secondary | ICD-10-CM | POA: Insufficient documentation

## 2024-02-27 DIAGNOSIS — B20 Human immunodeficiency virus [HIV] disease: Secondary | ICD-10-CM | POA: Insufficient documentation

## 2024-02-27 DIAGNOSIS — Z79899 Other long term (current) drug therapy: Secondary | ICD-10-CM

## 2024-02-27 LAB — T-HELPER CELL (CD4) - (RCID CLINIC ONLY)
CD4 % Helper T Cell: 26 % — ABNORMAL LOW (ref 33–65)
CD4 T Cell Abs: 446 /uL (ref 400–1790)

## 2024-02-28 LAB — URINE CYTOLOGY ANCILLARY ONLY
Chlamydia: NEGATIVE
Comment: NEGATIVE
Comment: NORMAL
Neisseria Gonorrhea: NEGATIVE

## 2024-02-29 LAB — CBC WITH DIFFERENTIAL/PLATELET
Absolute Lymphocytes: 1634 {cells}/uL (ref 850–3900)
Absolute Monocytes: 1333 {cells}/uL — ABNORMAL HIGH (ref 200–950)
Basophils Absolute: 103 {cells}/uL (ref 0–200)
Basophils Relative: 1.2 %
Eosinophils Absolute: 318 {cells}/uL (ref 15–500)
Eosinophils Relative: 3.7 %
HCT: 48.8 % (ref 38.5–50.0)
Hemoglobin: 16.4 g/dL (ref 13.2–17.1)
MCH: 35.8 pg — ABNORMAL HIGH (ref 27.0–33.0)
MCHC: 33.6 g/dL (ref 32.0–36.0)
MCV: 106.6 fL — ABNORMAL HIGH (ref 80.0–100.0)
MPV: 9.8 fL (ref 7.5–12.5)
Monocytes Relative: 15.5 %
Neutro Abs: 5212 {cells}/uL (ref 1500–7800)
Neutrophils Relative %: 60.6 %
Platelets: 252 Thousand/uL (ref 140–400)
RBC: 4.58 Million/uL (ref 4.20–5.80)
RDW: 12.2 % (ref 11.0–15.0)
Total Lymphocyte: 19 %
WBC: 8.6 Thousand/uL (ref 3.8–10.8)

## 2024-02-29 LAB — COMPLETE METABOLIC PANEL WITHOUT GFR
AG Ratio: 1.8 (calc) (ref 1.0–2.5)
ALT: 12 U/L (ref 9–46)
AST: 13 U/L (ref 10–35)
Albumin: 4.7 g/dL (ref 3.6–5.1)
Alkaline phosphatase (APISO): 83 U/L (ref 35–144)
BUN/Creatinine Ratio: 10 (calc) (ref 6–22)
BUN: 14 mg/dL (ref 7–25)
CO2: 30 mmol/L (ref 20–32)
Calcium: 10 mg/dL (ref 8.6–10.3)
Chloride: 100 mmol/L (ref 98–110)
Creat: 1.34 mg/dL — ABNORMAL HIGH (ref 0.70–1.28)
Globulin: 2.6 g/dL (ref 1.9–3.7)
Glucose, Bld: 56 mg/dL — ABNORMAL LOW (ref 65–99)
Potassium: 4.2 mmol/L (ref 3.5–5.3)
Sodium: 137 mmol/L (ref 135–146)
Total Bilirubin: 0.7 mg/dL (ref 0.2–1.2)
Total Protein: 7.3 g/dL (ref 6.1–8.1)

## 2024-02-29 LAB — LIPID PANEL
Cholesterol: 100 mg/dL (ref <200)
HDL: 44 mg/dL (ref >=40)
LDL Cholesterol (Calc): 35 mg/dL
Non-HDL Cholesterol (Calc): 56 mg/dL (ref <130)
Total CHOL/HDL Ratio: 2.3 (calc) (ref <5.0)
Triglycerides: 133 mg/dL (ref <150)

## 2024-02-29 LAB — RPR: RPR Ser Ql: NONREACTIVE

## 2024-02-29 LAB — HIV-1 RNA QUANT-NO REFLEX-BLD
HIV 1 RNA Quant: NOT DETECTED {copies}/mL
HIV-1 RNA Quant, Log: NOT DETECTED {Log_copies}/mL

## 2024-03-02 ENCOUNTER — Ambulatory Visit: Payer: Self-pay | Admitting: Infectious Diseases

## 2024-03-12 ENCOUNTER — Ambulatory Visit: Payer: Self-pay | Admitting: Internal Medicine

## 2024-03-14 ENCOUNTER — Other Ambulatory Visit: Payer: Self-pay

## 2024-03-14 ENCOUNTER — Ambulatory Visit (INDEPENDENT_AMBULATORY_CARE_PROVIDER_SITE_OTHER): Admitting: Infectious Diseases

## 2024-03-14 ENCOUNTER — Encounter: Payer: Self-pay | Admitting: Infectious Diseases

## 2024-03-14 VITALS — BP 99/52 | HR 56 | Temp 97.6°F | Resp 16 | Wt 132.0 lb

## 2024-03-14 DIAGNOSIS — Z0000 Encounter for general adult medical examination without abnormal findings: Secondary | ICD-10-CM

## 2024-03-14 DIAGNOSIS — B20 Human immunodeficiency virus [HIV] disease: Secondary | ICD-10-CM | POA: Diagnosis not present

## 2024-03-14 DIAGNOSIS — Z113 Encounter for screening for infections with a predominantly sexual mode of transmission: Secondary | ICD-10-CM

## 2024-03-14 DIAGNOSIS — Z5181 Encounter for therapeutic drug level monitoring: Secondary | ICD-10-CM

## 2024-03-14 DIAGNOSIS — Z7185 Encounter for immunization safety counseling: Secondary | ICD-10-CM

## 2024-03-14 MED ORDER — BIKTARVY 50-200-25 MG PO TABS: 1.0000 | ORAL_TABLET | Freq: Every day | ORAL | 3 refills | Status: AC

## 2024-03-14 NOTE — Progress Notes (Signed)
 [address], [place], [place], [address]                                                                  Phn. [phone]; Fax: [phone]                                                                             Date: 03/14/2024  Reason for Visit: Routine HIV care.  HPI: [name] Kim is a 73 y.o.old male with prior history of COPD, Squamous cell lung ca,  CKD, CAD, seizure, AAA, GERD, Anemia, HLD, HIV who is here for HIV follow-up.  Patient was last seen by Dr. Efrain and was followed by Dr. Elaine before then.  This is my first visit.    Interval hx/current visit: Accompanied by his male partner.  Reports been together for at least 50 years and monogamous.  Reports compliance with Biktarvy  with no missed doses or concerns.  Denies smoking alcohol and recreational drugs.  Follows with PCP and next appointment is on November 13.  Follows with dentist.  Reports receiving flu, COVID recently and pneumonia vaccine approximately 3 years ago.  No new concerns.  ROS: As stated in above HPI; all other systems were reviewed and are otherwise negative unless noted below  No reported fever / chills, night sweats, unintentional weight loss, acute visual change, odynophagia, chest pain/pressure, new or worsened SOB or WOB, nausea, vomiting, diarrhea, dysuria, GU discharge, syncope, seizures, red/hot swollen joints, hallucinations / delusions, rashes, new allergies, unusual / excessive bleeding, swollen lymph nodes, or new hospitalizations/ED visits/Urgent Care visits since the pt was last seen.  PMH/ PSH/ FamHx / Social Hx , medications and allergies reviewed and updated as appropriate; please see corresponding tab in EHR / prior notes                                        Current Outpatient  Medications on File Prior to Visit  Medication Sig Dispense Refill   albuterol  (VENTOLIN  HFA) 108 (90 Base) MCG/ACT inhaler INHALE 2 PUFFS INTO THE LUNGS EVERY 4 HOURS AS NEEDED FOR WHEEZING OR SHORTNESS OF BREATH 6.7 g 6   aspirin  81 MG tablet Take 81 mg by mouth daily.     atorvastatin  (LIPITOR) 20 MG tablet TAKE 1 TABLET BY MOUTH DAILY 90 tablet 3   ezetimibe  (ZETIA ) 10 MG tablet TAKE 1 TABLET(10 MG) BY MOUTH DAILY 90 tablet 3   folic acid  (FOLVITE ) 400 MCG tablet Take 400 mcg by mouth in the morning.  lamoTRIgine  (LAMICTAL ) 100 MG tablet Take 100 mg by mouth 2 (two) times daily.     omega-3 acid ethyl esters (LOVAZA ) 1 g capsule TAKE 1 CAPSULE BY MOUTH TWICE DAILY 180 capsule 0   umeclidinium-vilanterol (ANORO ELLIPTA ) 62.5-25 MCG/ACT AEPB Inhale 1 puff into the lungs daily. 60 each 6   No current facility-administered medications on file prior to visit.     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   Past Surgical History:  Procedure Laterality Date   APPENDECTOMY  1970   BRONCHIAL BIOPSY  05/31/2021   Procedure: BRONCHIAL BIOPSIES;  Surgeon: Shelah Lamar RAMAN, MD;  Location: [facility];  Service: Pulmonary;;   BRONCHIAL BRUSHINGS  05/31/2021   Procedure: BRONCHIAL BRUSHINGS;  Surgeon: Shelah Lamar RAMAN, MD;  Location: [facility];  Service: Pulmonary;;   BRONCHIAL NEEDLE ASPIRATION BIOPSY  05/31/2021   Procedure: BRONCHIAL NEEDLE ASPIRATION BIOPSIES;   Surgeon: Shelah Lamar RAMAN, MD;  Location: [facility];  Service: Pulmonary;;   CATARACT EXTRACTION Bilateral    CATARACT EXTRACTION W/PHACO Right 02/03/2020   Procedure: CATARACT EXTRACTION PHACO AND INTRAOCULAR LENS PLACEMENT (IOC) RIGHT 4.30 00:28.6;  Surgeon: Jaye Fallow, MD;  Location: [facility];  Service: Ophthalmology;  Laterality: Right;   CATARACT EXTRACTION W/PHACO Left 02/24/2020   Procedure: CATARACT EXTRACTION PHACO AND INTRAOCULAR LENS PLACEMENT (IOC) LEFT 5.58 00:32.2;  Surgeon: Jaye Fallow, MD;  Location: [facility];  Service: Ophthalmology;  Laterality: Left;   CHOLECYSTECTOMY     COLONOSCOPY     CORONARY ARTERY BYPASS GRAFT  08/09/2005   LIMA-LAD, SVG-diagonal, SVG-OM   FIDUCIAL MARKER PLACEMENT  05/31/2021   Procedure: FIDUCIAL MARKER PLACEMENT;  Surgeon: Shelah Lamar RAMAN, MD;  Location: [facility] ENDOSCOPY;  Service: Pulmonary;;   TONSILECTOMY, ADENOIDECTOMY, BILATERAL MYRINGOTOMY AND TUBES  08/1964   VIDEO BRONCHOSCOPY WITH RADIAL ENDOBRONCHIAL ULTRASOUND  05/31/2021   Procedure: VIDEO BRONCHOSCOPY WITH RADIAL ENDOBRONCHIAL ULTRASOUND;  Surgeon: Shelah Lamar RAMAN, MD;  Location: [facility];  Service: Pulmonary;;   Social History   Socioeconomic History   Marital status: Significant Other    Spouse name: Not on file   Number of children: Not on file   Years of education: Not on file   Highest education level: Not on file  Occupational History   Occupation: Research officer, trade union- retired  Tobacco Use   Smoking status: Former    Current packs/day: 0.00    Types: Cigarettes    Quit date: 08/09/2005    Years since quitting: 18.6   Smokeless tobacco: Never  Vaping Use   Vaping status: Never Used  Substance and Sexual Activity   Alcohol use: Yes    Comment: Social   Drug use: No   Sexual activity: Yes    Partners: Male    Birth control/protection: Condom    Comment: declined condoms  Other Topics Concern  Not on file  Social History  Narrative   Patient is right handed.   Patient drinks 1 cup of coffee daily.   Social Drivers of Corporate Investment Banker Strain: Low Risk  (03/03/2024)   Received from [facility]   Overall Financial Resource Strain (CARDIA)    How hard is it for you to pay for the very basics like food, housing, medical care, and heating?: Not hard at all  Food Insecurity: No Food Insecurity (03/03/2024)   Received from [facility]   Hunger Vital Sign    Within the past 12 months, you worried that your food would run out before you got the money to buy more.: Never true    Within the past 12 months, the food you bought just didn't last and you didn't have money to get more.: Never true  Transportation Needs: No Transportation Needs (03/03/2024)   Received from [facility] - Transportation    In the past 12 months, has lack of transportation kept you from medical appointments or from getting medications?: No    In the past 12 months, has lack of transportation kept you from meetings, work, or from getting things needed for daily living?: No  Physical Activity: Insufficiently Active (03/03/2024)   Received from [facility]   Exercise Vital Sign    On average, how many days per week do you engage in moderate to strenuous exercise (like a brisk walk)?: 2 days    On average, how many minutes do you engage in exercise at this level?: 30 min  Stress: No Stress Concern Present (03/03/2024)   Received from [facility] - Occupational Stress Questionnaire    Do you feel stress - tense, restless, nervous, or anxious, or unable to sleep at night because your mind is troubled all the time - these days?: Not at all  Social Connections: Socially Integrated (03/03/2024)   Received from [facility]   Social Network    How would you rate your social network (family, work, friends)?: Good participation with social networks  Intimate Partner Violence:  Not At Risk (03/03/2024)   Received from [facility]   HITS    Over the last 12 months how often did your partner physically hurt you?: Never    Over the last 12 months how often did your partner insult you or talk down to you?: Never    Over the last 12 months how often did your partner threaten you with physical harm?: Never    Over the last 12 months how often did your partner scream or curse at you?: Never   Family History  Problem Relation Age of Onset   Cancer Mother    Cancer Father        leukemia   Vitals  BP (!) 99/52   Pulse (!) 56   Temp 97.6 F (36.4 C) (Oral)   Resp 16   Wt 132 lb (59.9 kg)   SpO2 95%   BMI 21.31 kg/m    Examination  Gen: no acute distress HEENT: [place]/AT, no scleral icterus, no pale conjunctivae, hearing normal, oral mucosa moist Neck: Supple Cardio: Regular rate and rhythm, s1s2 Resp: Pulmonary effort normal in room air, Normal breath sounds  GI: nondistended, non tender and soft GU: Musc: Extremities: No pedal edema Skin: No rashes Neuro: grossly non focal , awake, alert and oriented * 3  Psych: Calm, cooperative  Lab Results HIV 1 RNA  Quant  Date Value  02/27/2024 NOT DETECTED copies/mL  03/27/2023 <20 Copies/mL (H)  11/21/2022 <20 copies/mL   CD4 T Cell Abs (/uL)  Date Value  02/27/2024 446  03/27/2023 492  03/22/2022 560   No results found for: HIV1GENOSEQ Lab Results  Component Value Date   WBC 8.6 02/27/2024   HGB 16.4 02/27/2024   HCT 48.8 02/27/2024   MCV 106.6 (H) 02/27/2024   PLT 252 02/27/2024    Lab Results  Component Value Date   CREATININE 1.34 (H) 02/27/2024   BUN 14 02/27/2024   NA 137 02/27/2024   K 4.2 02/27/2024   CL 100 02/27/2024   CO2 30 02/27/2024   Lab Results  Component Value Date   ALT 12 02/27/2024   AST 13 02/27/2024   ALKPHOS 64 09/17/2022   BILITOT 0.7 02/27/2024    Lab Results  Component Value Date   CHOL 100 02/27/2024   TRIG 133 02/27/2024   HDL 44 02/27/2024   LDLCALC  35 02/27/2024   Lab Results  Component Value Date   HAV TEST NOT PERFORMED 01/16/2012   HAV NEG 01/16/2012   Lab Results  Component Value Date   HEPBSAG TEST NOT PERFORMED 01/16/2012   HEPBSAG NEGATIVE 01/16/2012   HEPBSAB TEST NOT PERFORMED 01/16/2012   HEPBSAB POS (A) 01/16/2012   Lab Results  Component Value Date   HCVAB TEST NOT PERFORMED 01/16/2012   HCVAB NEGATIVE 01/16/2012   Lab Results  Component Value Date   CHLAMYDIAWP Negative 02/27/2024   N Negative 02/27/2024   No results found for: GCPROBEAPT No results found for: QUANTGOLD    Health Maintenance: Immunization History  Administered Date(s) Administered    sv, Bivalent, Protein Subunit Rsvpref,pf (Abrysvo) 02/16/2022   Fluad Quad(high Dose 65+) 01/28/2020, 01/31/2021, 02/01/2022, 02/20/2023   INFLUENZA, HIGH DOSE SEASONAL PF 03/20/2016, 02/27/2017, 02/08/2018, 03/19/2019   Influenza Split 03/23/2011, 02/08/2012, 03/31/2014   Influenza, Seasonal, Injecte, Preservative Fre 03/01/2015   Influenza,inj,Quad PF,6+ Mos 03/11/2013   Influenza-Unspecified 03/23/2015, 01/28/2020   Moderna Covid-19 Fall Seasonal Vaccine 46yrs & older 02/16/2022   Moderna Covid-19 Vaccine Bivalent Booster 59yrs & up 02/15/2021   Moderna Sars-Covid-2 Vaccination 06/26/2019, 07/24/2019, 03/15/2020, 08/09/2020   PFIZER(Purple Top)SARS-COV-2 Vaccination 03/15/2020   PPD Test 01/16/2012   Pfizer Covid-19 Vaccine Bivalent Booster 59yrs & up 02/15/2021   Pfizer(Comirnaty)Fall Seasonal Vaccine 12 years and older 03/03/2024   Pneumococcal Conjugate-13 12/15/2014   Pneumococcal Polysaccharide-23 05/23/2009, 04/22/2011, 05/18/2016   Pneumococcal-Unspecified 02/22/2015   Tdap 02/02/2018   Zoster Recombinant(Shingrix ) 02/19/2019, 04/23/2019   Zoster, Live 02/20/2011    Assessment/Plan: # HIV -Adherence assessed and compliant -DDI's reviewed -Side effects reviewed -02/27/24 reviewed, cr @1 .34, known CKD, advised to fu with  PCP -Continue p.o. Biktarvy  1 tab p.o. daily, refill sent -Follow-up in 11 months.  Prefers 11 months appointment instead of 6   # STD Screening  - Monogamous - Low risk - Urine GC and RPR negative   #Immunization  -Reports receiving flu and COVID-vaccine  #Health maintenance -Follows dentist -Colon cancer screening per PCP but may not need due to his age  Patient's labs were reviewed as well as his previous records. Patients questions were addressed and answered. Safe sex counseling done.  I spent 36  involved in face-to-face and non-face-to-face activities for this patient on the day of the visit. Professional time spent includes the following activities: Preparing to see the patient (review of tests), Obtaining and reviewing separately obtained history (prior note from Dr. Efrain and Dr.  Elaine, PCP notes 10/13, 4/7, Pulmonary note 6/20, Cardiology note 5/12), Performing a medically appropriate examination and evaluation , Ordering medications/labs, referring and communicating with other health care professionals, Documenting clinical information in the EMR, Independently interpreting results (not separately reported), Communicating results to the patient/partner, Counseling and educating the patient/partner and Care coordination (not separately reported).   Of note, portions of this note may have been created with voice recognition software. While this note has been edited for accuracy, occasional wrong-word or 'sound-a-like' substitutions may have occurred due to the inherent limitations of voice recognition software.   Electronically signed by:  Annalee Orem, MD Infectious Disease Physician [facility] [address] [place], [place] [address] Phone: [phone]  Fax: [phone] '

## 2024-03-15 NOTE — Addendum Note (Signed)
 Addended by: DEA SHINER on: 03/15/2024 05:54 PM   Modules accepted: Orders

## 2024-04-01 ENCOUNTER — Other Ambulatory Visit: Payer: Self-pay | Admitting: Cardiovascular Disease

## 2024-04-10 ENCOUNTER — Ambulatory Visit (INDEPENDENT_AMBULATORY_CARE_PROVIDER_SITE_OTHER)

## 2024-04-10 DIAGNOSIS — L0102 Bockhart's impetigo: Secondary | ICD-10-CM | POA: Diagnosis not present

## 2024-04-10 DIAGNOSIS — L720 Epidermal cyst: Secondary | ICD-10-CM | POA: Diagnosis not present

## 2024-04-10 DIAGNOSIS — W908XXA Exposure to other nonionizing radiation, initial encounter: Secondary | ICD-10-CM | POA: Diagnosis not present

## 2024-04-10 DIAGNOSIS — D485 Neoplasm of uncertain behavior of skin: Secondary | ICD-10-CM

## 2024-04-10 DIAGNOSIS — L578 Other skin changes due to chronic exposure to nonionizing radiation: Secondary | ICD-10-CM

## 2024-04-10 DIAGNOSIS — L814 Other melanin hyperpigmentation: Secondary | ICD-10-CM

## 2024-04-10 DIAGNOSIS — L570 Actinic keratosis: Secondary | ICD-10-CM | POA: Diagnosis not present

## 2024-04-10 DIAGNOSIS — L821 Other seborrheic keratosis: Secondary | ICD-10-CM | POA: Diagnosis not present

## 2024-04-10 NOTE — Patient Instructions (Addendum)

## 2024-04-10 NOTE — Progress Notes (Signed)
 Subjective   [name] Kim is a 73 y.o. male who presents for the following: Lesion(s) of concern . Patient is new patient  Today patient reports: Spots at right eye, present for > 1 year. Patient drained smaller bump and drained white with odor. No hx skin cancer.   Review of Systems:    No other skin or systemic complaints except as noted in HPI or Assessment and Plan.  The following portions of the chart were reviewed this encounter and updated as appropriate: medications, allergies, medical history  Relevant Medical History:  n/a   Objective  (SKPE) Well appearing patient in no apparent distress; mood and affect are within normal limits. Examination was performed of the: Sun Exposed Exam: Scalp, head, eyes, ears, nose, lips, neck, upper extremities, hands, fingers, fingernails  Examination notable for: Lentigo/lentigines: Scattered pigmented macules that are tan to brown in color and are somewhat non-uniform in shape and concentrated in the sun-exposed areas, Seborrheic Keratosis(es): Stuck-on appearing keratotic papule(s) on the trunk, some  irritated with redness, crusting, edema, and/or partial avulsion, Actinic Damage/Elastosis: chronic sun damage: dyspigmentation, telangiectasia, and wrinkling, Actinic keratosis: Scaly erythematous macule(s) concentrated on sun exposed areas   Examination limited by: Clothing   right orbital rim superior 6 mm pink pearly papule  right orbital rim inferior 1.0 cm pink pearly plaque face x 7 (7) Erythematous thin papules/macules with gritty scale.   Assessment & Plan  (SKAP)   BENIGN SKIN FINDINGS  - Lentigines  - Seborrheic keratoses - Reassurance provided regarding the benign appearance of lesions noted on exam today; no treatment is indicated in the absence of symptoms/changes. - Reinforced importance of photoprotective strategies including liberal and frequent sunscreen use of a broad-spectrum SPF 30 or greater, use of protective  clothing, and sun avoidance for prevention of cutaneous malignancy and photoaging.  Counseled patient on the importance of regular self-skin monitoring as well as routine clinical skin examinations as scheduled.   ACTINIC DAMAGE - Chronic condition, secondary to cumulative UV/sun exposure - Recommend daily broad spectrum sunscreen SPF 30+ to sun-exposed areas, reapply every 2 hours as needed.  - Staying in the shade or wearing long sleeves, sun glasses (UVA+UVB protection) and wide brim hats (4-inch brim around the entire circumference of the hat) are also recommended for sun protection.  - Call for new or changing lesions.  LOC x 2 R orbital rim - Discussed mohs w/ Dr. Corey   Procedures, orders, diagnosis for this visit:  NEOPLASM OF UNCERTAIN BEHAVIOR OF SKIN (2) right orbital rim superior Skin / nail biopsy Type of biopsy: tangential   Informed consent: discussed and consent obtained   Timeout: patient name, date of birth, surgical site, and procedure verified   Procedure prep:  Patient was prepped and draped in usual sterile fashion Prep type:  Isopropyl alcohol Anesthesia: the lesion was anesthetized in a standard fashion   Anesthetic:  1% lidocaine  w/ epinephrine  1-100,000 buffered w/ 8.4% NaHCO3 Instrument used: DermaBlade   Hemostasis achieved with: pressure and aluminum chloride   Outcome: patient tolerated procedure well   Post-procedure details: sterile dressing applied and wound care instructions given   Dressing type: bandage and petrolatum    Specimen 1 - Surgical pathology Differential Diagnosis: BCC  Check Margins: No right orbital rim inferior Skin / nail biopsy Type of biopsy: tangential   Informed consent: discussed and consent obtained   Timeout: patient name, date of birth, surgical site, and procedure verified   Procedure prep:  Patient  was prepped and draped in usual sterile fashion Prep type:  Isopropyl alcohol Anesthesia: the lesion was anesthetized in  a standard fashion   Anesthetic:  1% lidocaine  w/ epinephrine  1-100,000 buffered w/ 8.4% NaHCO3 Instrument used: DermaBlade   Hemostasis achieved with: pressure and aluminum chloride   Outcome: patient tolerated procedure well   Post-procedure details: sterile dressing applied and wound care instructions given   Dressing type: bandage and petrolatum    Specimen 2 - Surgical pathology Differential Diagnosis: BCC  Check Margins: No Discussed referral for Mohs with Dr. Paci if + for skin cancer. AK (ACTINIC KERATOSIS) (7) face x 7 (7) Actinic keratoses are precancerous spots that appear secondary to cumulative UV radiation exposure/sun exposure over time. They are chronic with expected duration over 1 year. A portion of actinic keratoses will progress to squamous cell carcinoma of the skin. It is not possible to reliably predict which spots will progress to skin cancer and so treatment is recommended to prevent development of skin cancer.  Recommend daily broad spectrum sunscreen SPF 30+ to sun-exposed areas, reapply every 2 hours as needed.  Recommend staying in the shade or wearing long sleeves, sun glasses (UVA+UVB protection) and wide brim hats (4-inch brim around the entire circumference of the hat). Call for new or changing lesions. Destruction of lesion - face x 7 (7) Complexity: simple   Destruction method: cryotherapy   Informed consent: discussed and consent obtained   Timeout:  patient name, date of birth, surgical site, and procedure verified Lesion destroyed using liquid nitrogen: Yes   Region frozen until ice ball extended beyond lesion: Yes   Cryo cycles: 1 or 2. Outcome: patient tolerated procedure well with no complications   Post-procedure details: wound care instructions given     Neoplasm of uncertain behavior of skin -     Skin / nail biopsy -     Skin / nail biopsy -     Surgical pathology; Standing  AK (actinic keratosis) -     Destruction of  lesion    Return to clinic: Return in about 6 months (around 10/08/2024) for AK follow up, with Dr. Raymund.  [name] [name], RMA, am acting as scribe for Lauraine JAYSON Raymund, MD .   Documentation: I have reviewed the above documentation for accuracy and completeness, and I agree with the above.  Lauraine JAYSON Raymund, MD

## 2024-04-14 LAB — SURGICAL PATHOLOGY

## 2024-04-15 ENCOUNTER — Ambulatory Visit: Payer: Self-pay

## 2024-04-15 NOTE — Telephone Encounter (Signed)
 Advised pt of bx results/sh ?

## 2024-04-15 NOTE — Telephone Encounter (Signed)
-----   Message from Lauraine JAYSON Kanaris sent at 04/15/2024  8:18 AM EST -----    1. 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 Skin, right orbital rim inferior :       EPIDERMOID CYSTS  (MULTIPLE)   Please notify patient with below plan: Both benign and c/w cysts - no further tx  ----- Message ----- From: Interface, Lab In [facility] Sent: 04/14/2024   5:57 PM EST To: Lauraine JAYSON Kanaris, MD

## 2024-04-18 ENCOUNTER — Other Ambulatory Visit: Payer: Self-pay | Admitting: Emergency Medicine

## 2024-05-09 ENCOUNTER — Inpatient Hospital Stay
Admission: RE | Admit: 2024-05-09 | Discharge: 2024-05-09 | Disposition: A | Source: Ambulatory Visit | Attending: Emergency Medicine | Admitting: Emergency Medicine

## 2024-05-09 DIAGNOSIS — C3492 Malignant neoplasm of unspecified part of left bronchus or lung: Secondary | ICD-10-CM

## 2024-05-20 ENCOUNTER — Other Ambulatory Visit: Payer: Self-pay | Admitting: Cardiovascular Disease

## 2024-06-04 ENCOUNTER — Other Ambulatory Visit: Payer: Self-pay | Admitting: Emergency Medicine

## 2024-06-06 ENCOUNTER — Ambulatory Visit: Payer: Self-pay | Admitting: Emergency Medicine

## 2024-06-09 NOTE — Telephone Encounter (Signed)
 Spoke with patient [name] Kim, wil see you at next OV

## 2024-07-02 ENCOUNTER — Ambulatory Visit: Admitting: Emergency Medicine

## 2024-09-09 ENCOUNTER — Ambulatory Visit: Admitting: Cardiovascular Disease

## 2024-10-08 ENCOUNTER — Ambulatory Visit

## 2025-01-29 ENCOUNTER — Other Ambulatory Visit

## 2025-02-12 ENCOUNTER — Ambulatory Visit: Payer: Self-pay | Admitting: Infectious Diseases
